# Patient Record
Sex: Female | Born: 1970 | Hispanic: Yes | Marital: Married | Smoking: Never smoker
Health system: Southern US, Community
[De-identification: ages and names within clinical notes are randomized; demographics above are authoritative.]

## PROBLEM LIST (undated history)

## (undated) DIAGNOSIS — R7303 Prediabetes: Secondary | ICD-10-CM

## (undated) DIAGNOSIS — I10 Essential (primary) hypertension: Secondary | ICD-10-CM

## (undated) DIAGNOSIS — G8929 Other chronic pain: Secondary | ICD-10-CM

## (undated) DIAGNOSIS — M549 Dorsalgia, unspecified: Secondary | ICD-10-CM

## (undated) DIAGNOSIS — M1990 Unspecified osteoarthritis, unspecified site: Secondary | ICD-10-CM

## (undated) DIAGNOSIS — Z8709 Personal history of other diseases of the respiratory system: Secondary | ICD-10-CM

## (undated) HISTORY — PX: TONSILLECTOMY: SUR1361

## (undated) HISTORY — PX: BOWEL RESECTION: SHX1257

## (undated) HISTORY — PX: EYE SURGERY: SHX253

## (undated) HISTORY — PX: CHOLECYSTECTOMY: SHX55

---

## 2006-10-21 ENCOUNTER — Emergency Department: Payer: Self-pay | Admitting: Emergency Medicine

## 2006-10-23 ENCOUNTER — Ambulatory Visit: Payer: Self-pay | Admitting: Unknown Physician Specialty

## 2006-11-23 ENCOUNTER — Emergency Department: Payer: Self-pay | Admitting: Emergency Medicine

## 2010-03-27 ENCOUNTER — Ambulatory Visit: Payer: Self-pay | Admitting: Internal Medicine

## 2010-11-09 ENCOUNTER — Ambulatory Visit: Payer: Self-pay | Admitting: Anesthesiology

## 2010-11-14 ENCOUNTER — Ambulatory Visit: Payer: Self-pay | Admitting: Surgery

## 2011-05-27 ENCOUNTER — Ambulatory Visit: Payer: Self-pay | Admitting: Internal Medicine

## 2012-05-29 ENCOUNTER — Ambulatory Visit: Payer: Self-pay | Admitting: Unknown Physician Specialty

## 2012-06-04 ENCOUNTER — Ambulatory Visit: Payer: Self-pay | Admitting: Internal Medicine

## 2012-08-20 LAB — COMPREHENSIVE METABOLIC PANEL
Anion Gap: 4 — ABNORMAL LOW (ref 7–16)
Bilirubin,Total: 0.5 mg/dL (ref 0.2–1.0)
Calcium, Total: 9.2 mg/dL (ref 8.5–10.1)
Chloride: 100 mmol/L (ref 98–107)
Co2: 32 mmol/L (ref 21–32)
Creatinine: 0.66 mg/dL (ref 0.60–1.30)
EGFR (African American): >60
EGFR (Non-African Amer.): >60
Glucose: 108 mg/dL — ABNORMAL HIGH (ref 65–99)
Potassium: 3.7 mmol/L (ref 3.5–5.1)
SGOT(AST): 23 U/L (ref 15–37)
SGPT (ALT): 23 U/L (ref 12–78)
Sodium: 136 mmol/L (ref 136–145)
Total Protein: 8.5 g/dL — ABNORMAL HIGH (ref 6.4–8.2)

## 2012-08-20 LAB — CBC
HCT: 41.6 % (ref 35.0–47.0)
MCH: 27 pg (ref 26.0–34.0)
MCHC: 33.8 g/dL (ref 32.0–36.0)
MCV: 80 fL (ref 80–100)
Platelet: 315 10*3/uL (ref 150–440)
RBC: 5.21 10*6/uL — ABNORMAL HIGH (ref 3.80–5.20)
RDW: 13.9 % (ref 11.5–14.5)

## 2012-08-20 LAB — URINALYSIS, COMPLETE
Bacteria: NONE SEEN
Bilirubin,UR: NEGATIVE
Glucose,UR: NEGATIVE mg/dL (ref 0–75)
Ketone: NEGATIVE
Leukocyte Esterase: NEGATIVE
Nitrite: NEGATIVE
Ph: 9 (ref 4.5–8.0)
RBC,UR: 415 /HPF (ref 0–5)
WBC UR: 3 /HPF (ref 0–5)

## 2012-08-20 LAB — LIPASE, BLOOD: Lipase: 125 U/L (ref 73–393)

## 2012-08-21 ENCOUNTER — Observation Stay: Payer: Self-pay | Admitting: Surgery

## 2012-08-21 LAB — POTASSIUM: Potassium: 4 mmol/L (ref 3.5–5.1)

## 2013-06-14 ENCOUNTER — Ambulatory Visit: Payer: Self-pay

## 2014-05-20 NOTE — H&P (Signed)
History of Present Illness 44 yo Mexican-American female with 5 day h/o epigatric pain, which initially waxed and waned, and avergage episodes lasting about 3 hours, whose pain did not resolve today. No nausea, vomited once (today), no fever, and no change n the color of her urine, stool, skin, or eyes.   Past Med/Surgical Hx:  anal fissure:   HTN:   lower back problems:   Obesity:   Excision of anal polyps and internal hemorrhoids-rubber band:   Dilation and Curretage:   ALLERGIES:  No Known Allergies:   HOME MEDICATIONS: Medication Instructions Status  hydrochlorothiazide 25 mg oral tablet 1 tab(s) orally once a day Active   Family and Social History:  Family History sister had a complicated postop course from cholecystectomy in [place]   Social History negative tobacco, negative ETOH, married, lives with husband, unemployed   Place of [place]   Review of Systems:  Fever/Chills No   Cough No   Sputum No   Abdominal Pain Yes   Diarrhea No   Constipation No   Nausea/Vomiting Yes   SOB/DOE No   Chest Pain No   Dysuria No   Tolerating PT Yes   Tolerating Diet Yes  Vomiting   Medications/Allergies Reviewed Medications/Allergies reviewed   Physical Exam:  GEN well developed, well nourished, no acute distress, obese   HEENT pink conjunctivae, PERRL, hearing intact to voice, moist oral mucosa, Oropharynx clear   RESP normal resp effort  clear BS  no use of accessory muscles   CARD regular rate  no murmur  No LE edema  no JVD  no Rub   ABD positive tenderness  soft  very mild RUQ tenderness   LYMPH negative neck   EXTR negative cyanosis/clubbing, negative edema   SKIN normal to palpation, No rashes, No ulcers, skin turgor good   NEURO cranial nerves intact, negative tremor, follows commands, motor/sensory function intact   PSYCH alert, A+O to time, place, person, good insight   Lab Results: Hepatic:  24-Jul-14 19:18   Bilirubin, Total 0.5   Alkaline Phosphatase 119  SGPT (ALT) 23  SGOT (AST) 23  Total Protein, Serum  8.5  Albumin, Serum 4.2  Routine Chem:  24-Jul-14 19:18   Glucose, Serum  108  BUN 13  Creatinine (comp) 0.66  Sodium, Serum 136  Potassium, Serum 3.7  Chloride, Serum 100  CO2, Serum 32  Calcium (Total), Serum 9.2  Osmolality (calc) 273  eGFR (African American) >60  eGFR (Non-African American) >60 (eGFR values <63m/min/1.73 m2 may be an indication of chronic kidney disease (CKD). Calculated eGFR is useful in patients with stable renal function. The eGFR calculation will not be reliable in acutely ill patients when serum creatinine is changing rapidly. It is not useful in  patients on dialysis. The eGFR calculation may not be applicable to patients at the low and high extremes of body sizes, pregnant women, and vegetarians.)  Anion Gap  4  Lipase 125 (Result(s) reported on 20 Aug 2012 at 07:54PM.)  Cardiac:  24-Jul-14 19:18   Troponin I < 0.02 (0.00-0.05 0.05 ng/mL or less: NEGATIVE  Repeat testing in 3-6 hrs  if clinically indicated. >0.05 ng/mL: POTENTIAL  MYOCARDIAL INJURY. Repeat  testing in 3-6 hrs if  clinically indicated. NOTE: An increase or decrease  of 30% or more on serial  testing suggests a  clinically important change)  Routine UA:  24-Jul-14 19:18   Color (UA) Amber  Clarity (UA) Clear  Glucose (UA) Negative  Bilirubin (  UA) Negative  Ketones (UA) Negative  Specific Gravity (UA) 1.023  Blood (UA) 3+  pH (UA) 9.0  Protein (UA) 100 mg/dL  Nitrite (UA) Negative  Leukocyte Esterase (UA) Negative (Result(s) reported on 20 Aug 2012 at 08:01PM.)  RBC (UA) 415 /HPF  WBC (UA) 3 /HPF  Bacteria (UA) NONE SEEN  Epithelial Cells (UA) 2 /HPF  Mucous (UA) PRESENT (Result(s) reported on 20 Aug 2012 at 08:01PM.)  Routine Hem:  24-Jul-14 19:18   WBC (CBC)  14.0  RBC (CBC)  5.21  Hemoglobin (CBC) 14.1  Hematocrit (CBC) 41.6  Platelet Count (CBC) 315 (Result(s) reported on 20 Aug 2012 at 07:46PM.)  MCV 80  MCH 27.0  MCHC 33.8  RDW 13.9   Radiology Results: [place]:    24-Jul-14 22:20, US Abdomen Limited Survey  US Abdomen Limited Survey  REASON FOR EXAM:    ruq pain, positive murphy sign  COMMENTS:   Body Site: GB and Fossa, CBD, Head of Pancreas    PROCEDURE: [place]  - US ABDOMEN LIMITED SURVEY  - Aug 20 2012 10:20PM     RESULT: Comparison: None    Technique: Multiple gray-scale and color-flow Doppler images of the right   upper quadrant are presented for review.    Findings:    Visualized portions of the liver demonstrate normal echogenicity and   normal contours. The liver is without evidence of a focal hepatic lesion.   There arecholelithiasis. There is an impacted gallstone in the   gallbladder neck. There is no intra- or extrahepatic biliary ductal   dilatation. The common duct measures 4.9 mm in maximal diameter. There is   no gallbladder wall thickening, pericholecystic fluid, or sonographic   Murphy's sign.     The visualized portion of the pancreas is normal in echogenicity.    IMPRESSION:     Cholelithiasis without sonographic evidence of acute cholecystitis. There   is a gallstone impacted within the gallbladder neck.    Dictation Site: 1    Verified By: Jennette Banker, M.D., MD  LabUnknown:    24-Jul-14 20:49, CT Abdomen Pelvis WO for [facility]  PACS Image    24-Jul-14 22:20, US Abdomen Limited Survey  PACS Image  CT:    24-Jul-14 20:49, CT Abdomen Pelvis WO for [facility]  CT Abdomen Pelvis WO for Stone  REASON FOR EXAM:    hematuria, CVA tenderness  COMMENTS:   LMP: Negative Beta HCG    PROCEDURE: CT  - CT ABDOMEN /PELVIS WO (STONE)  - Aug 20 2012  8:49PM     RESULT: Indication: Flank Pain    Comparison: None    Technique: Multiple axial images fromthe lung bases to the symphysis   pubis were obtained without oral and without intravenous contrast.    Findings:    The lung bases are clear. There is no pleural or pericardial  effusions.  No renal, ureteral, or bladder calculi. No obstructive uropathy. No   perinephric stranding is seen. The kidneys are symmetric in size without   evidence for exophytic mass. The bladder is unremarkable.    The liver demonstrates no focal abnormality. The gallbladder is   unremarkable. The spleen demonstrates no focal abnormality. The adrenal   glands and pancreas are normal.     The unopacified stomach, duodenum, small intestine, and large intestine   are unremarkable, but evaluation is limited by lack of oral contrast.   There is a normal caliber appendixin the right lower quadrant without   periappendiceal inflammatory  changes. There is no pneumoperitoneum,   pneumatosis, or portal venous gas. There is no abdominal or pelvic free   fluid. There is no lymphadenopathy.   The abdominal aorta is normal in caliber .    The osseous structures are unremarkable.    IMPRESSION:     1. No urolithiasis or obstructive uropathy.     Dictation Site: 1        Verified By: Jennette Banker, M.D., MD    Assessment/Admission Diagnosis Possible gallbladder hydrops   Plan Admit, lap CCY Pt and husband understand 1/200 risk of CBD injury and agree to proceed.   Electronic Signatures: Consuela Mimes (MD)  (Signed 24-Jul-14 23:23)  Authored: CHIEF COMPLAINT and HISTORY, PAST MEDICAL/SURGIAL HISTORY, ALLERGIES, HOME MEDICATIONS, FAMILY AND SOCIAL HISTORY, REVIEW OF SYSTEMS, PHYSICAL EXAM, LABS, Radiology, ASSESSMENT AND PLAN   Last Updated: 24-Jul-14 23:23 by Consuela Mimes (MD)

## 2014-05-20 NOTE — Op Note (Signed)
PATIENT NAME:  Taylor Valencia, Taylor Valencia MR#:  [id number] DATE OF BIRTH:  05-Mar-1970  DATE OF PROCEDURE:  08/21/2012  PREOPERATIVE DIAGNOSIS: Acute cholecystitis.   POSTOPERATIVE DIAGNOSIS: Acute cholecystitis.   PROCEDURE PERFORMED: Laparoscopic cholecystectomy.   ESTIMATED BLOOD LOSS: 200 mL.   COMPLICATIONS: None.   SPECIMENS: Gallbladder.   IMPLANTS: One JP drain in gallbladder fossa.   HISTORY OF PRESENT ILLNESS: Ms. Taylor Valencia is a 44 year old female who has had a history of recurrent right upper quadrant pain and 5 days of consistent worsening right upper quadrant pain. She was noted to have a nonmobile stone in the neck of her gallbladder and leukocytosis. I thus brought her to the operating room suite for laparoscopic cholecystectomy.   DETAILS OF PROCEDURE: As follows: Ms. Taylor Valencia was brought to the operating room suite. She was laid supine on the operating room table. She was induced, endotracheal tube was placed, general anesthesia was administered. Her abdomen was then prepped and draped in standard surgical fashion. A timeout was then performed correctly identifying the patient name, operative site and procedure to be performed. A supraumbilical incision was made. This was deepened down to the fascia. The fascia was incised. The peritoneum was entered. Two stay sutures were placed through the fasciotomy. A Hasson trocar was placed through the fasciotomy. The abdomen was insufflated. The gallbladder was distended and taut and appeared to be acutely inflamed. An 11 mm epigastric port was placed, and two 5 mm right subcostal trocars were placed at the midclavicular and anterior axillary line. The gallbladder was then aspirated with an ovarian cyst aspirator, and the gallbladder was lifted over the dome of the liver. The cystic duct and cystic artery were dissected out. There was a very thick rind around the gallbladder and the cystic duct. A distinct cystic artery was not  identified, but there were 2 candidates which were all clipped and ligated. The gallbladder cystic duct was dilated and thickened, and I was afraid that placing clips across the cystic duct would not be sufficient. Therefore, a used an Endo GIA stapler to ligate the cystic duct. The gallbladder was then taken off the gallbladder fossa. It was taken out of the abdomen with an Endo Catch bag. I then looked at the gallbladder fossa and noted it was hemostatic. I irrigated the gallbladder fossa with a large amount of normal saline. Because of the amount of inflammation and the size of the cystic duct, I did leave a JP drain which was brought out through the lateral port site and sutured in place with a 3-0 nylon suture. After the gallbladder fossa was irrigated and noted to be hemostatic, I removed all trocars under direct visualization. The supraumbilical fascia was closed with a figure-of-eight 0 Vicryl suture. All port sites were closed with a 4-0 Monocryl deep dermal suture. The patient was then awoken, extubated and brought to the postanesthesia care unit. There were no immediate complications. Needle, sponge and instrument counts were correct at the end of the procedure.   ____________________________ Glena Norfolk. Melani Brisbane, MD cal:OSi D: 08/22/2012 11:25:00 ET T: 08/22/2012 11:35:44 ET JOB#: [id number]  cc: Harrell Gave A. Shasta Chinn, MD, <Dictator> Floyde Parkins MD ELECTRONICALLY SIGNED 08/31/2012 19:04

## 2014-08-04 ENCOUNTER — Other Ambulatory Visit: Payer: Self-pay | Admitting: Internal Medicine

## 2014-08-04 DIAGNOSIS — Z1231 Encounter for screening mammogram for malignant neoplasm of breast: Secondary | ICD-10-CM

## 2014-08-11 ENCOUNTER — Ambulatory Visit
Admission: RE | Admit: 2014-08-11 | Discharge: 2014-08-11 | Disposition: A | Discharge status: Home / Self Care | Payer: BLUE CROSS/BLUE SHIELD | Source: Ambulatory Visit | Attending: Internal Medicine | Admitting: Internal Medicine

## 2014-08-11 DIAGNOSIS — Z1231 Encounter for screening mammogram for malignant neoplasm of breast: Secondary | ICD-10-CM | POA: Insufficient documentation

## 2014-10-06 ENCOUNTER — Ambulatory Visit (INDEPENDENT_AMBULATORY_CARE_PROVIDER_SITE_OTHER): Payer: BLUE CROSS/BLUE SHIELD | Admitting: Podiatry

## 2014-10-06 ENCOUNTER — Encounter: Payer: Self-pay | Admitting: Podiatry

## 2014-10-06 VITALS — BP 145/82 | HR 71 | Resp 18

## 2014-10-06 DIAGNOSIS — L600 Ingrowing nail: Secondary | ICD-10-CM

## 2014-10-06 NOTE — Progress Notes (Signed)
   Subjective:    Patient ID: Taylor Valencia, female    DOB: [birth year], 44 y.o.   MRN: [id number]  HPI  44 year old female presents the office today with concerns of left big toe nail pain which has been ongoing for the last several weeks. She states that she has tried to see if there is some drainage coming from the nail border however she is only got some blood to clear drainage but denies any pus. She states that the inside border of the left hallux toenail is painful particularly pressure. She is to trim the area of her nail herself without any relief. She denies any surrounding redness or red streaks. No other complaints at this time.   Review of Systems  All other systems reviewed and are negative.      Objective:   Physical Exam AAO x3, NAD DP/PT pulses palpable bilaterally, CRT less than 3 seconds Protective sensation intact with Simms Weinstein monofilament, vibratory sensation intact, Achilles tendon reflex intact There is evidence of incurvation on the medial aspect of the left hallux toenail with tenderness to palpation along the proximal medial border of the left hallux toenail. Because they could be some dried drainage on the nail border and the small amount of granulation tissues present in the nail borders well. There is a small amount of localized edema dripping from the nail border on the medial aspect without any associated erythema, increase in warmth, ascending cellulitis, fluctuance, crepitus, malodor. There is no tenderness to the lateral portion of the nail or the other toenails. No areas of tenderness to bilateral lower extremities. MMT 5/5, ROM WNL.  No open lesions or pre-ulcerative lesions.  No overlying edema, erythema, increase in warmth to bilateral lower extremities.  No pain with calf compression, swelling, warmth, erythema bilaterally.      Assessment & Plan:  44 year old female left medial hallux symptomatic ingrown toenail -Treatment options  discussed including all alternatives, risks, and complications -At this time, the patient is requesting partial nail removal with chemical matricectomy to the symptomatic portion of the nail. Risks and complications were discussed with the patient for which they understand and  verbally consent to the procedure. Under sterile conditions a total of 3 mL of a mixture of 2% lidocaine plain and 0.5% Marcaine plain was infiltrated in a hallux block fashion. Once anesthetized, the skin was prepped in sterile fashion. A tourniquet was then applied. Next the medial aspect of hallux nail border was then sharply excised making sure to remove the entire offending nail border. Once the nails were ensured to be removed area was debrided and the underlying skin was intact. There is no purulence identified in the procedure. Next phenol was then applied under standard conditions and copiously irrigated. Silvadene was applied. A dry sterile dressing was applied. After application of the dressing the tourniquet was removed and there is found to be an immediate capillary refill time to the digit. The patient tolerated the procedure well any complications. Post procedure instructions were discussed the patient for which he verbally understood. Follow-up in one week for nail check or sooner if any problems are to arise. Discussed signs/symptoms of infection and directed to call the office immediately should any occur or go directly to the emergency room. In the meantime, encouraged to call the office with any questions, concerns, changes symptoms.  Celesta Gentile, DPM

## 2014-10-06 NOTE — Patient Instructions (Signed)

## 2014-10-13 ENCOUNTER — Ambulatory Visit (INDEPENDENT_AMBULATORY_CARE_PROVIDER_SITE_OTHER): Payer: BLUE CROSS/BLUE SHIELD | Admitting: Podiatry

## 2014-10-13 ENCOUNTER — Encounter: Payer: Self-pay | Admitting: Podiatry

## 2014-10-13 VITALS — BP 131/76 | HR 83 | Resp 18

## 2014-10-13 DIAGNOSIS — Z9889 Other specified postprocedural states: Secondary | ICD-10-CM

## 2014-10-13 DIAGNOSIS — L600 Ingrowing nail: Secondary | ICD-10-CM

## 2014-10-13 NOTE — Patient Instructions (Signed)

## 2014-10-13 NOTE — Progress Notes (Signed)
Patient ID: Taylor Valencia, female   DOB: Jun 04, 1970, 44 y.o.   MRN: [id number]  Subjective: 44 year old female presents the office they for followup evaluation one week status post left partial nail avulsion of the hallux. She states that overall she is doing well. She says occasionally does throb. She stopped covering the area this morning as the area was remained somewhat wet. Denies any drainage or purulence. Denies any redness or drainage streaks. Denies any systemic complaints as fevers, chills, nausea, vomiting. No calf pain, chest pain, shortness of breath. No other complaints at this time. No acute changes otherwise.  Objective: AAO X3, NAD  DP/PT pulses palpable, CRT less than 3 seconds  Protective sensation intact with Simms Weinstein monofilament Status post left medial hallux partial nail avulsion. This Versus Chronic Granulation Tissue Present. There Is No Tenderness to Palpation over the Surgical Site. There Is No Surrounding Edema, Erythema, Increase in Warmth. There Is No Drainage or Purulence. No Clinical Signs of Infection. No Malodor. No Other Areas of Tenderness to Bilateral Lower Sugars. Open Lesions or Pre-Ulcerative Lesions Otherwise. No Pain with Calf Compression, Swelling, Warmth, Erythema.  Assessment: 1 week status post left medial hallux partial nail avulsion, doing well  Plan: Continue soaking in epsom salts twice a day followed by antibiotic ointment and a band-aid. Can leave uncovered at night. Continue this until completely healed.  If the area has not healed in 2 weeks, call the office for follow-up appointment, or sooner if any problems arise.  Monitor for any signs/symptoms of infection. Call the office immediately if any occur or go directly to the emergency room. Call with any questions/concerns.  Celesta Gentile, DPM

## 2015-05-25 ENCOUNTER — Other Ambulatory Visit: Payer: Self-pay | Admitting: Internal Medicine

## 2015-05-25 DIAGNOSIS — G8929 Other chronic pain: Secondary | ICD-10-CM

## 2015-05-25 DIAGNOSIS — M5134 Other intervertebral disc degeneration, thoracic region: Secondary | ICD-10-CM

## 2015-05-25 DIAGNOSIS — M546 Pain in thoracic spine: Secondary | ICD-10-CM

## 2015-06-15 ENCOUNTER — Ambulatory Visit: Payer: BLUE CROSS/BLUE SHIELD

## 2015-08-02 ENCOUNTER — Other Ambulatory Visit: Payer: Self-pay | Admitting: Neurosurgery

## 2015-08-14 ENCOUNTER — Other Ambulatory Visit: Payer: Self-pay | Admitting: Internal Medicine

## 2015-08-14 DIAGNOSIS — Z1231 Encounter for screening mammogram for malignant neoplasm of breast: Secondary | ICD-10-CM

## 2015-08-16 ENCOUNTER — Encounter (HOSPITAL_COMMUNITY): Payer: Self-pay

## 2015-08-16 ENCOUNTER — Encounter (HOSPITAL_COMMUNITY)
Admission: RE | Admit: 2015-08-16 | Discharge: 2015-08-16 | Disposition: A | Discharge status: Home / Self Care | Payer: BLUE CROSS/BLUE SHIELD | Source: Ambulatory Visit | Attending: Neurosurgery | Admitting: Neurosurgery

## 2015-08-16 DIAGNOSIS — Z01812 Encounter for preprocedural laboratory examination: Secondary | ICD-10-CM | POA: Diagnosis present

## 2015-08-16 DIAGNOSIS — Z01810 Encounter for preprocedural cardiovascular examination: Secondary | ICD-10-CM | POA: Insufficient documentation

## 2015-08-16 HISTORY — DX: Other chronic pain: G89.29

## 2015-08-16 HISTORY — DX: Essential (primary) hypertension: I10

## 2015-08-16 HISTORY — DX: Unspecified osteoarthritis, unspecified site: M19.90

## 2015-08-16 HISTORY — DX: Dorsalgia, unspecified: M54.9

## 2015-08-16 HISTORY — DX: Personal history of other diseases of the respiratory system: Z87.09

## 2015-08-16 LAB — SURGICAL PCR SCREEN
MRSA, PCR: NEGATIVE
STAPHYLOCOCCUS AUREUS: NEGATIVE

## 2015-08-16 LAB — CBC
HEMATOCRIT: 40.8 % (ref 36.0–46.0)
HEMOGLOBIN: 14 g/dL (ref 12.0–15.0)
MCH: 28.5 pg (ref 26.0–34.0)
MCHC: 34.3 g/dL (ref 30.0–36.0)
MCV: 82.9 fL (ref 78.0–100.0)
Platelets: 276 10*3/uL (ref 150–400)
RBC: 4.92 MIL/uL (ref 3.87–5.11)
RDW: 13.9 % (ref 11.5–15.5)
WBC: 13.2 10*3/uL — AB (ref 4.0–10.5)

## 2015-08-16 LAB — BASIC METABOLIC PANEL
ANION GAP: 6 (ref 5–15)
BUN: 16 mg/dL (ref 6–20)
CALCIUM: 9.1 mg/dL (ref 8.9–10.3)
CO2: 21 mmol/L — ABNORMAL LOW (ref 22–32)
Chloride: 108 mmol/L (ref 101–111)
Creatinine, Ser: 0.49 mg/dL (ref 0.44–1.00)
GFR calc Af Amer: >60 mL/min (ref >60)
Glucose, Bld: 96 mg/dL (ref 65–99)
POTASSIUM: 3.6 mmol/L (ref 3.5–5.1)
SODIUM: 135 mmol/L (ref 135–145)

## 2015-08-16 LAB — HCG, SERUM, QUALITATIVE: PREG SERUM: NEGATIVE

## 2015-08-16 NOTE — Progress Notes (Signed)
PCP - Glendon Axe Cardiologist - denies  Chest x-ray - not needed EKG - 08/16/15 Stress Test - denies ECHO - denies Cardiac Cath - denies    Patient denies shortness of breath, fever, cough and chest pain at PAT appointment

## 2015-08-16 NOTE — Pre-Procedure Instructions (Signed)
[facility]  08/16/2015      [facility] [place], [place] - [address] [place] [address] Phone: [phone] Fax: [phone]    Your procedure is scheduled on July 27  Report to [facility] [place]M.  Call this number if you have problems the morning of surgery:  [phone]   Remember:  Do not eat food or drink liquids after midnight.   Take these medicines the morning of surgery with A SIP OF WATER eye drops if needed, tramdol if needed  7 days prior to surgery STOP taking any Aspirin, Aleve, Naproxen, Ibuprofen, Motrin, Advil, Goody's, BC's, all herbal medications, fish oil, and all vitamins also   stop taking Lorcaserin today    Do not wear jewelry, make-up or nail polish.  Do not wear lotions, powders, or perfumes.  You may NOT wear deoderant.  Do not shave 48 hours prior to surgery.    Do not bring valuables to the hospital.   [facility] is not responsible for any belongings or valuables.  Contacts, dentures or bridgework may not be worn into surgery.  Leave your suitcase in the car.  After surgery it may be brought to your room.  For patients admitted to the hospital, discharge time will be determined by your treatment team.  Patients discharged the day of surgery will not be allowed to drive home.    Special instructions:   [place]- Preparing For Surgery  Before surgery, you can play an important role. Because skin is not sterile, your skin needs to be as free of germs as possible. You can reduce the number of germs on your skin by washing with CHG (chlorahexidine gluconate) Soap before surgery.  CHG is an antiseptic cleaner which kills germs and bonds with the skin to continue killing germs even after washing.  Please do not use if you have an allergy to CHG or antibacterial soaps. If your skin becomes reddened/irritated stop using the CHG.  Do not shave (including legs and  underarms) for at least 48 hours prior to first CHG shower. It is OK to shave your face.  Please follow these instructions carefully.   1. Shower the NIGHT BEFORE SURGERY and the MORNING OF SURGERY with CHG.   2. If you chose to wash your hair, wash your hair first as usual with your normal shampoo.  3. After you shampoo, rinse your hair and body thoroughly to remove the shampoo.  4. Use CHG as you would any other liquid soap. You can apply CHG directly to the skin and wash gently with a scrungie or a clean washcloth.   5. Apply the CHG Soap to your body ONLY FROM THE NECK DOWN.  Do not use on open wounds or open sores. Avoid contact with your eyes, ears, mouth and genitals (private parts). Wash genitals (private parts) with your normal soap.  6. Wash thoroughly, paying special attention to the area where your surgery will be performed.  7. Thoroughly rinse your body with warm water from the neck down.  8. DO NOT shower/wash with your normal soap after using and rinsing off the CHG Soap.  9. Pat yourself dry with a CLEAN TOWEL.   10. Wear CLEAN PAJAMAS   11. Place CLEAN SHEETS on your bed the night of your first shower and DO NOT SLEEP WITH PETS.    Day of Surgery: Do not apply any deodorants/lotions. Please wear clean clothes to  the hospital/surgery center.      Please read over the following fact sheets that you were given. Pain Booklet, Coughing and Deep Breathing, MRSA Information and Surgical Site Infection Prevention

## 2015-08-23 MED ORDER — DEXTROSE 5 % IV SOLN
3.0000 g | INTRAVENOUS | Status: AC
  Administered 2015-08-24 (×2): 3 g via INTRAVENOUS
  Filled 2015-08-23: qty 3000

## 2015-08-23 MED ORDER — CEFAZOLIN SODIUM-DEXTROSE 2-4 GM/100ML-% IV SOLN: 2.0000 g | INTRAVENOUS | Status: DC

## 2015-08-24 ENCOUNTER — Encounter (HOSPITAL_COMMUNITY)
Admission: RE | Disposition: A | Discharge status: Home / Self Care | Payer: Self-pay | Source: Ambulatory Visit | Attending: Neurosurgery

## 2015-08-24 ENCOUNTER — Ambulatory Visit (HOSPITAL_COMMUNITY): Payer: BLUE CROSS/BLUE SHIELD

## 2015-08-24 ENCOUNTER — Ambulatory Visit (HOSPITAL_COMMUNITY): Payer: BLUE CROSS/BLUE SHIELD | Admitting: Anesthesiology

## 2015-08-24 ENCOUNTER — Ambulatory Visit (HOSPITAL_COMMUNITY)
Admission: RE | Admit: 2015-08-24 | Discharge: 2015-08-26 | Disposition: A | Discharge status: Home / Self Care | Payer: BLUE CROSS/BLUE SHIELD | Source: Ambulatory Visit | Attending: Neurosurgery | Admitting: Neurosurgery

## 2015-08-24 ENCOUNTER — Ambulatory Visit (HOSPITAL_COMMUNITY): Payer: BLUE CROSS/BLUE SHIELD | Admitting: Emergency Medicine

## 2015-08-24 ENCOUNTER — Encounter (HOSPITAL_COMMUNITY): Payer: Self-pay | Admitting: Surgery

## 2015-08-24 DIAGNOSIS — G959 Disease of spinal cord, unspecified: Secondary | ICD-10-CM | POA: Insufficient documentation

## 2015-08-24 DIAGNOSIS — Z419 Encounter for procedure for purposes other than remedying health state, unspecified: Secondary | ICD-10-CM

## 2015-08-24 DIAGNOSIS — Z791 Long term (current) use of non-steroidal anti-inflammatories (NSAID): Secondary | ICD-10-CM | POA: Diagnosis not present

## 2015-08-24 DIAGNOSIS — I10 Essential (primary) hypertension: Secondary | ICD-10-CM | POA: Diagnosis not present

## 2015-08-24 DIAGNOSIS — Z79899 Other long term (current) drug therapy: Secondary | ICD-10-CM | POA: Diagnosis not present

## 2015-08-24 DIAGNOSIS — M4714 Other spondylosis with myelopathy, thoracic region: Secondary | ICD-10-CM | POA: Diagnosis present

## 2015-08-24 DIAGNOSIS — M4804 Spinal stenosis, thoracic region: Secondary | ICD-10-CM | POA: Diagnosis present

## 2015-08-24 DIAGNOSIS — M5414 Radiculopathy, thoracic region: Secondary | ICD-10-CM | POA: Diagnosis not present

## 2015-08-24 HISTORY — PX: LUMBAR LAMINECTOMY/DECOMPRESSION MICRODISCECTOMY: SHX5026

## 2015-08-24 SURGERY — LUMBAR LAMINECTOMY/DECOMPRESSION MICRODISCECTOMY 2 LEVELS
Anesthesia: General

## 2015-08-24 MED ORDER — HYDROMORPHONE HCL 1 MG/ML IJ SOLN
INTRAMUSCULAR | Status: AC
  Filled 2015-08-24: qty 2

## 2015-08-24 MED ORDER — ROCURONIUM BROMIDE 100 MG/10ML IV SOLN: INTRAVENOUS | Status: DC | PRN

## 2015-08-24 MED ORDER — ONDANSETRON HCL 4 MG/2ML IJ SOLN
INTRAMUSCULAR | Status: AC
  Filled 2015-08-24: qty 2

## 2015-08-24 MED ORDER — THROMBIN 5000 UNITS EX SOLR
OROMUCOSAL | Status: DC | PRN
  Administered 2015-08-24: 10:00:00 via TOPICAL

## 2015-08-24 MED ORDER — CARBOXYMETHYLCELLULOSE SODIUM 0.5 % OP SOLN: 1.0000 [drp] | Freq: Three times a day (TID) | OPHTHALMIC | Status: DC | PRN

## 2015-08-24 MED ORDER — PROPOFOL 10 MG/ML IV BOLUS: INTRAVENOUS | Status: DC | PRN

## 2015-08-24 MED ORDER — PROPOFOL 10 MG/ML IV BOLUS
INTRAVENOUS | Status: DC | PRN
  Administered 2015-08-24: 30 mg via INTRAVENOUS
  Administered 2015-08-24: 170 mg via INTRAVENOUS

## 2015-08-24 MED ORDER — CHLORHEXIDINE GLUCONATE CLOTH 2 % EX PADS: 6.0000 | MEDICATED_PAD | Freq: Once | CUTANEOUS | Status: DC

## 2015-08-24 MED ORDER — SUCCINYLCHOLINE CHLORIDE 200 MG/10ML IV SOSY
PREFILLED_SYRINGE | INTRAVENOUS | Status: AC
  Filled 2015-08-24: qty 10

## 2015-08-24 MED ORDER — SUCCINYLCHOLINE CHLORIDE 20 MG/ML IJ SOLN: INTRAMUSCULAR | Status: DC | PRN

## 2015-08-24 MED ORDER — SODIUM CHLORIDE 0.9 % IR SOLN
Status: DC | PRN
  Administered 2015-08-24: 09:00:00

## 2015-08-24 MED ORDER — PROPOFOL 10 MG/ML IV BOLUS
INTRAVENOUS | Status: AC
  Filled 2015-08-24: qty 40

## 2015-08-24 MED ORDER — ACETAMINOPHEN 10 MG/ML IV SOLN
INTRAVENOUS | Status: AC
  Filled 2015-08-24: qty 100

## 2015-08-24 MED ORDER — CEFAZOLIN SODIUM-DEXTROSE 2-4 GM/100ML-% IV SOLN
2.0000 g | Freq: Three times a day (TID) | INTRAVENOUS | Status: AC
  Administered 2015-08-24 – 2015-08-25 (×2): 2 g via INTRAVENOUS
  Filled 2015-08-24 (×2): qty 100

## 2015-08-24 MED ORDER — LIDOCAINE HCL (CARDIAC) 20 MG/ML IV SOLN: INTRAVENOUS | Status: DC | PRN

## 2015-08-24 MED ORDER — PROMETHAZINE HCL 25 MG/ML IJ SOLN: 6.2500 mg | INTRAMUSCULAR | Status: DC | PRN

## 2015-08-24 MED ORDER — LACTATED RINGERS IV SOLN
INTRAVENOUS | Status: DC
  Administered 2015-08-24: 13:00:00 via INTRAVENOUS

## 2015-08-24 MED ORDER — HYPROMELLOSE (GONIOSCOPIC) 2.5 % OP SOLN
1.0000 [drp] | Freq: Three times a day (TID) | OPHTHALMIC | Status: DC | PRN
  Filled 2015-08-24: qty 15

## 2015-08-24 MED ORDER — LACTATED RINGERS IV SOLN: INTRAVENOUS | Status: DC

## 2015-08-24 MED ORDER — LIDOCAINE HCL (CARDIAC) 20 MG/ML IV SOLN
INTRAVENOUS | Status: DC | PRN
  Administered 2015-08-24: 60 mg via INTRAVENOUS

## 2015-08-24 MED ORDER — THROMBIN 20000 UNITS EX SOLR
CUTANEOUS | Status: DC | PRN
  Administered 2015-08-24: 10:00:00 via TOPICAL

## 2015-08-24 MED ORDER — ACETAMINOPHEN 10 MG/ML IV SOLN
1000.0000 mg | Freq: Once | INTRAVENOUS | Status: AC
  Administered 2015-08-24: 1000 mg via INTRAVENOUS

## 2015-08-24 MED ORDER — ROCURONIUM BROMIDE 50 MG/5ML IV SOLN
INTRAVENOUS | Status: AC
  Filled 2015-08-24: qty 1

## 2015-08-24 MED ORDER — DOCUSATE SODIUM 100 MG PO CAPS
100.0000 mg | ORAL_CAPSULE | Freq: Two times a day (BID) | ORAL | Status: DC
  Administered 2015-08-24 – 2015-08-26 (×4): 100 mg via ORAL
  Filled 2015-08-24 (×4): qty 1

## 2015-08-24 MED ORDER — TIZANIDINE HCL 4 MG PO TABS
2.0000 mg | ORAL_TABLET | Freq: Every day | ORAL | Status: DC
  Administered 2015-08-24 – 2015-08-25 (×2): 2 mg via ORAL
  Filled 2015-08-24 (×2): qty 1

## 2015-08-24 MED ORDER — MEPERIDINE HCL 25 MG/ML IJ SOLN: 6.2500 mg | INTRAMUSCULAR | Status: DC | PRN

## 2015-08-24 MED ORDER — TRAMADOL HCL 50 MG PO TABS
50.0000 mg | ORAL_TABLET | Freq: Four times a day (QID) | ORAL | Status: DC | PRN
  Administered 2015-08-24: 50 mg via ORAL
  Filled 2015-08-24: qty 1

## 2015-08-24 MED ORDER — PHENYLEPHRINE HCL 10 MG/ML IJ SOLN
INTRAMUSCULAR | Status: DC | PRN
  Administered 2015-08-24: 80 ug via INTRAVENOUS
  Administered 2015-08-24: 40 ug via INTRAVENOUS
  Administered 2015-08-24 (×2): 80 ug via INTRAVENOUS

## 2015-08-24 MED ORDER — ONDANSETRON HCL 4 MG/2ML IJ SOLN: 4.0000 mg | INTRAMUSCULAR | Status: DC | PRN

## 2015-08-24 MED ORDER — THROMBIN 5000 UNITS EX SOLR
CUTANEOUS | Status: DC | PRN
  Administered 2015-08-24 (×2): 5000 [IU] via TOPICAL

## 2015-08-24 MED ORDER — DEXAMETHASONE SODIUM PHOSPHATE 10 MG/ML IJ SOLN
INTRAMUSCULAR | Status: AC
  Filled 2015-08-24: qty 1

## 2015-08-24 MED ORDER — MIDAZOLAM HCL 2 MG/2ML IJ SOLN
INTRAMUSCULAR | Status: AC
  Filled 2015-08-24: qty 2

## 2015-08-24 MED ORDER — BUPIVACAINE LIPOSOME 1.3 % IJ SUSP
20.0000 mL | Freq: Once | INTRAMUSCULAR | Status: DC
  Filled 2015-08-24: qty 20

## 2015-08-24 MED ORDER — ALBUMIN HUMAN 5 % IV SOLN
INTRAVENOUS | Status: DC | PRN
Start: 2015-08-24 — End: 2015-08-24
  Administered 2015-08-24: 11:00:00 via INTRAVENOUS

## 2015-08-24 MED ORDER — HEMOSTATIC AGENTS (NO CHARGE) OPTIME
TOPICAL | Status: DC | PRN
  Administered 2015-08-24: 1 via TOPICAL

## 2015-08-24 MED ORDER — BUPIVACAINE-EPINEPHRINE (PF) 0.5% -1:200000 IJ SOLN
INTRAMUSCULAR | Status: DC | PRN
  Administered 2015-08-24: 10 mL via PERINEURAL

## 2015-08-24 MED ORDER — BISACODYL 10 MG RE SUPP: 10.0000 mg | Freq: Every day | RECTAL | Status: DC | PRN

## 2015-08-24 MED ORDER — ACETAMINOPHEN 650 MG RE SUPP: 650.0000 mg | RECTAL | Status: DC | PRN

## 2015-08-24 MED ORDER — HYDROCODONE-ACETAMINOPHEN 5-325 MG PO TABS
1.0000 | ORAL_TABLET | ORAL | Status: DC | PRN
  Administered 2015-08-24 – 2015-08-26 (×9): 2 via ORAL
  Filled 2015-08-24 (×9): qty 2

## 2015-08-24 MED ORDER — SUCCINYLCHOLINE CHLORIDE 20 MG/ML IJ SOLN
INTRAMUSCULAR | Status: DC | PRN
  Administered 2015-08-24: 120 mg via INTRAVENOUS

## 2015-08-24 MED ORDER — ROCURONIUM BROMIDE 100 MG/10ML IV SOLN
INTRAVENOUS | Status: DC | PRN
  Administered 2015-08-24 (×3): 10 mg via INTRAVENOUS
  Administered 2015-08-24: 50 mg via INTRAVENOUS

## 2015-08-24 MED ORDER — ALUM & MAG HYDROXIDE-SIMETH 200-200-20 MG/5ML PO SUSP: 30.0000 mL | Freq: Four times a day (QID) | ORAL | Status: DC | PRN

## 2015-08-24 MED ORDER — FENTANYL CITRATE (PF) 250 MCG/5ML IJ SOLN
INTRAMUSCULAR | Status: AC
  Filled 2015-08-24: qty 5

## 2015-08-24 MED ORDER — BACITRACIN ZINC 500 UNIT/GM EX OINT
TOPICAL_OINTMENT | CUTANEOUS | Status: DC | PRN
  Administered 2015-08-24: 1 via TOPICAL

## 2015-08-24 MED ORDER — FENTANYL CITRATE (PF) 100 MCG/2ML IJ SOLN
INTRAMUSCULAR | Status: DC | PRN
  Administered 2015-08-24 (×5): 50 ug via INTRAVENOUS

## 2015-08-24 MED ORDER — LISINOPRIL-HYDROCHLOROTHIAZIDE 10-12.5 MG PO TABS: 1.0000 | ORAL_TABLET | Freq: Every day | ORAL | Status: DC

## 2015-08-24 MED ORDER — ACETAMINOPHEN 325 MG PO TABS
650.0000 mg | ORAL_TABLET | ORAL | Status: DC | PRN
  Administered 2015-08-25: 650 mg via ORAL
  Filled 2015-08-24 (×2): qty 2

## 2015-08-24 MED ORDER — HYDROMORPHONE HCL 1 MG/ML IJ SOLN
0.2500 mg | INTRAMUSCULAR | Status: DC | PRN
  Administered 2015-08-24 (×4): 0.5 mg via INTRAVENOUS

## 2015-08-24 MED ORDER — MORPHINE SULFATE (PF) 2 MG/ML IV SOLN
1.0000 mg | INTRAVENOUS | Status: DC | PRN
Start: 2015-08-24 — End: 2015-08-26
  Administered 2015-08-24 (×2): 4 mg via INTRAVENOUS
  Filled 2015-08-24 (×2): qty 2

## 2015-08-24 MED ORDER — 0.9 % SODIUM CHLORIDE (POUR BTL) OPTIME
TOPICAL | Status: DC | PRN
  Administered 2015-08-24: 1000 mL

## 2015-08-24 MED ORDER — MENTHOL 3 MG MT LOZG: 1.0000 | LOZENGE | OROMUCOSAL | Status: DC | PRN

## 2015-08-24 MED ORDER — ARTIFICIAL TEARS OP OINT
TOPICAL_OINTMENT | OPHTHALMIC | Status: AC
  Filled 2015-08-24: qty 3.5

## 2015-08-24 MED ORDER — LACTATED RINGERS IV SOLN
INTRAVENOUS | Status: DC | PRN
  Administered 2015-08-24 (×3): via INTRAVENOUS

## 2015-08-24 MED ORDER — DIAZEPAM 5 MG PO TABS
ORAL_TABLET | ORAL | Status: AC
  Filled 2015-08-24: qty 1

## 2015-08-24 MED ORDER — MIDAZOLAM HCL 5 MG/5ML IJ SOLN
INTRAMUSCULAR | Status: DC | PRN
  Administered 2015-08-24 (×2): 1 mg via INTRAVENOUS

## 2015-08-24 MED ORDER — LISINOPRIL 20 MG PO TABS
10.0000 mg | ORAL_TABLET | Freq: Every day | ORAL | Status: DC
  Administered 2015-08-25: 10 mg via ORAL
  Filled 2015-08-24: qty 1

## 2015-08-24 MED ORDER — DIAZEPAM 5 MG PO TABS
5.0000 mg | ORAL_TABLET | Freq: Four times a day (QID) | ORAL | Status: DC | PRN
  Administered 2015-08-24 – 2015-08-26 (×8): 5 mg via ORAL
  Filled 2015-08-24 (×7): qty 1

## 2015-08-24 MED ORDER — PHENOL 1.4 % MT LIQD: 1.0000 | OROMUCOSAL | Status: DC | PRN

## 2015-08-24 MED ORDER — GLYCOPYRROLATE 0.2 MG/ML IJ SOLN
INTRAMUSCULAR | Status: DC | PRN
  Administered 2015-08-24: 0.6 mg via INTRAVENOUS

## 2015-08-24 MED ORDER — NEOSTIGMINE METHYLSULFATE 10 MG/10ML IV SOLN
INTRAVENOUS | Status: DC | PRN
  Administered 2015-08-24: 4 mg via INTRAVENOUS

## 2015-08-24 MED ORDER — ONDANSETRON HCL 4 MG/2ML IJ SOLN
INTRAMUSCULAR | Status: DC | PRN
  Administered 2015-08-24: 4 mg via INTRAVENOUS

## 2015-08-24 MED ORDER — OXYCODONE-ACETAMINOPHEN 5-325 MG PO TABS
1.0000 | ORAL_TABLET | ORAL | Status: DC | PRN
Start: 2015-08-24 — End: 2015-08-24

## 2015-08-24 MED ORDER — DEXTROSE 5 % IV SOLN
INTRAVENOUS | Status: DC | PRN
  Administered 2015-08-24: 15 ug/min via INTRAVENOUS

## 2015-08-24 MED ORDER — HYDROCHLOROTHIAZIDE 12.5 MG PO CAPS
12.5000 mg | ORAL_CAPSULE | Freq: Every day | ORAL | Status: DC
  Administered 2015-08-25: 12.5 mg via ORAL
  Filled 2015-08-24: qty 1

## 2015-08-24 MED ORDER — MELOXICAM 7.5 MG PO TABS
15.0000 mg | ORAL_TABLET | Freq: Every day | ORAL | Status: DC
  Administered 2015-08-24 – 2015-08-25 (×2): 15 mg via ORAL
  Filled 2015-08-24: qty 1
  Filled 2015-08-24: qty 2
  Filled 2015-08-24 (×2): qty 1

## 2015-08-24 MED ORDER — ARTIFICIAL TEARS OP OINT
TOPICAL_OINTMENT | OPHTHALMIC | Status: DC | PRN
  Administered 2015-08-24: 1 via OPHTHALMIC

## 2015-08-24 SURGICAL SUPPLY — 67 items
BAG DECANTER FOR FLEXI CONT (MISCELLANEOUS) ×3 IMPLANT
BENZOIN TINCTURE PRP APPL 2/3 (GAUZE/BANDAGES/DRESSINGS) ×3 IMPLANT
BIT DRILL NEURO 2X3.1 SFT TUCH (MISCELLANEOUS) ×1 IMPLANT
BLADE CLIPPER SURG (BLADE) IMPLANT
BRUSH SCRUB EZ PLAIN DRY (MISCELLANEOUS) ×3 IMPLANT
BUR MATCHSTICK NEURO 3.0 LAGG (BURR) ×3 IMPLANT
BUR PRECISION FLUTE 6.0 (BURR) ×3 IMPLANT
CANISTER SUCT 3000ML PPV (MISCELLANEOUS) ×3 IMPLANT
CLOSURE WOUND 1/2 X4 (GAUZE/BANDAGES/DRESSINGS) ×1
DRAPE LAPAROTOMY 100X72X124 (DRAPES) ×3 IMPLANT
DRAPE MICROSCOPE LEICA (MISCELLANEOUS) ×3 IMPLANT
DRAPE POUCH INSTRU U-SHP 10X18 (DRAPES) ×3 IMPLANT
DRAPE SURG 17X23 STRL (DRAPES) ×12 IMPLANT
DRILL NEURO 2X3.1 SOFT TOUCH (MISCELLANEOUS) ×3
ELECT BLADE 4.0 EZ CLEAN MEGAD (MISCELLANEOUS) ×3
ELECT REM PT RETURN 9FT ADLT (ELECTROSURGICAL) ×3
ELECTRODE BLDE 4.0 EZ CLN MEGD (MISCELLANEOUS) ×1 IMPLANT
ELECTRODE REM PT RTRN 9FT ADLT (ELECTROSURGICAL) ×1 IMPLANT
EXPAREL ×3 IMPLANT
GAUZE SPONGE 4X4 12PLY STRL (GAUZE/BANDAGES/DRESSINGS) ×3 IMPLANT
GAUZE SPONGE 4X4 16PLY XRAY LF (GAUZE/BANDAGES/DRESSINGS) ×3 IMPLANT
GLOVE BIO SURGEON STRL SZ7 (GLOVE) ×3 IMPLANT
GLOVE BIO SURGEON STRL SZ8 (GLOVE) ×9 IMPLANT
GLOVE BIO SURGEON STRL SZ8.5 (GLOVE) ×3 IMPLANT
GLOVE BIOGEL PI IND STRL 7.0 (GLOVE) ×1 IMPLANT
GLOVE BIOGEL PI IND STRL 7.5 (GLOVE) ×2 IMPLANT
GLOVE BIOGEL PI INDICATOR 7.0 (GLOVE) ×2
GLOVE BIOGEL PI INDICATOR 7.5 (GLOVE) ×4
GLOVE EXAM NITRILE LRG STRL (GLOVE) IMPLANT
GLOVE EXAM NITRILE MD LF STRL (GLOVE) IMPLANT
GLOVE EXAM NITRILE XL STR (GLOVE) IMPLANT
GLOVE EXAM NITRILE XS STR PU (GLOVE) IMPLANT
GLOVE SS BIOGEL STRL SZ 7 (GLOVE) ×4 IMPLANT
GLOVE SUPERSENSE BIOGEL SZ 7 (GLOVE) ×8
GOWN STRL REUS W/ TWL LRG LVL3 (GOWN DISPOSABLE) IMPLANT
GOWN STRL REUS W/ TWL XL LVL3 (GOWN DISPOSABLE) ×1 IMPLANT
GOWN STRL REUS W/TWL 2XL LVL3 (GOWN DISPOSABLE) IMPLANT
GOWN STRL REUS W/TWL LRG LVL3 (GOWN DISPOSABLE)
GOWN STRL REUS W/TWL XL LVL3 (GOWN DISPOSABLE) ×2
GRAFT DURAGEN MATRIX 1WX1L (Tissue) ×3 IMPLANT
HEMOSTAT POWDER KIT SURGIFOAM (HEMOSTASIS) ×3 IMPLANT
KIT BASIN OR (CUSTOM PROCEDURE TRAY) ×3 IMPLANT
KIT ROOM TURNOVER OR (KITS) ×3 IMPLANT
NEEDLE HYPO 21X1.5 SAFETY (NEEDLE) ×3 IMPLANT
NEEDLE HYPO 22GX1.5 SAFETY (NEEDLE) ×3 IMPLANT
NS IRRIG 1000ML POUR BTL (IV SOLUTION) ×3 IMPLANT
PACK LAMINECTOMY NEURO (CUSTOM PROCEDURE TRAY) ×3 IMPLANT
PAD ARMBOARD 7.5X6 YLW CONV (MISCELLANEOUS) ×9 IMPLANT
PATTIES SURGICAL .5 X.5 (GAUZE/BANDAGES/DRESSINGS) ×18 IMPLANT
PATTIES SURGICAL .5 X1 (DISPOSABLE) IMPLANT
PATTIES SURGICAL .5 X3 (DISPOSABLE) ×3 IMPLANT
PATTIES SURGICAL 1X1 (DISPOSABLE) ×6 IMPLANT
RUBBERBAND STERILE (MISCELLANEOUS) ×6 IMPLANT
SPONGE NEURO XRAY DETECT 1X3 (DISPOSABLE) ×3 IMPLANT
SPONGE SURGIFOAM ABS GEL 100 (HEMOSTASIS) ×3 IMPLANT
SPONGE SURGIFOAM ABS GEL SZ50 (HEMOSTASIS) ×3 IMPLANT
STRIP CLOSURE SKIN 1/2X4 (GAUZE/BANDAGES/DRESSINGS) ×2 IMPLANT
SUT ETHILON 2 0 FS 18 (SUTURE) ×6 IMPLANT
SUT PROLENE 6 0 BV (SUTURE) ×9 IMPLANT
SUT VIC AB 1 CT1 18XBRD ANBCTR (SUTURE) ×1 IMPLANT
SUT VIC AB 1 CT1 8-18 (SUTURE) ×2
SUT VIC AB 2-0 CP2 18 (SUTURE) ×3 IMPLANT
SYR 20CC LL (SYRINGE) ×3 IMPLANT
TAPE CLOTH SURG 4X10 WHT LF (GAUZE/BANDAGES/DRESSINGS) ×3 IMPLANT
TOWEL OR 17X24 6PK STRL BLUE (TOWEL DISPOSABLE) ×3 IMPLANT
TOWEL OR 17X26 10 PK STRL BLUE (TOWEL DISPOSABLE) ×3 IMPLANT
WATER STERILE IRR 1000ML POUR (IV SOLUTION) ×3 IMPLANT

## 2015-08-24 NOTE — Op Note (Signed)
Brief history: The patient is a 45 year old obese Hispanic female who has complained of thoracic pain with bilateral lower extremity pain and numbness and weakness. She has failed medical management and was worked up with a thoracic MRI. This demonstrated spinal stenosis at T9-10 and T10-11. I discussed the situation with the patient. We discussed the various treatment options including surgery. She has weighed the risks, benefits, and alternatives to surgery and decided to proceed with a T9-10 and T10-11 laminectomy.  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 Earle Gell  Asst.: Dr. Sherley Bounds  Anesthesia: Gen. endotracheal  Estimated blood loss: 250 mL  Drains: None  Complications: Durotomy  Description of procedure: The patient was brought to the operating room by the anesthesia team. General endotracheal anesthesia was induced. The patient was turned to the prone position on the Wilson frame. The patient's lumbosacral region was then prepared with Betadine scrub and Betadine solution. Sterile drapes were applied.  I then injected the area to be incised with Marcaine with epinephrine solution. I then used a scalpel to make a linear midline incision over the T9-T10 and T10-11 intervertebral disc space. I then used electrocautery to perform a bilateral subperiosteal dissection exposing the spinous process and lamina of T9, T10, T11. We obtained intraoperative radiograph to confirm our location. I then inserted the [facility] retractor for exposure. I removed the spinous process at T9 and T10 with the [place] rongeur. I removed the caudal aspect of the T8 and cephalad aspect of the T11 spinous process.  We then brought the operative microscope into the field. Under its magnification and illumination we  completed the microdissection. I used a high-speed drill to perform a laminotomy at T9-10 and T10-11 bilaterally. I then used a Kerrison punches to complete the T9 and T10 laminectomy and widen the laminotomy site at T8 and remove the cephalad aspect of the T11 lamina and removed the ligamentum flavum at T9-10 and T10-11. The dura at T10-11 was practically nonexistent and was continuous with the ligament. In performing the laminectomy and removal of the ligamentum flavum we did create a durotomy. There was leakage of spinal fluid. When the spinal fluid leak the epidurals veins began bleeding more. After we had removed the ligamentum flavum there was a large piece of the dura missing. This could not be repaired primarily. I repaired it with a DuraGen patch. I secured the patch with several interrupted 6-0 Prolene's.   At this point we had a good decompression. We then obtained hemostasis using bipolar electrocautery. We irrigated the wound out with bacitracin solution. We then removed the retractor. We then reapproximated the patient's thoracolumbar fascia with interrupted #1 Vicryl suture. We then reapproximated the patient's subcutaneous tissue with interrupted 2-0 Vicryl suture. We then reapproximated patient's skin with a running 20 nylon suture. The drapes were removed. The patient was subsequently returned to the supine position where they were extubated by the anesthesia team. The patient was then transported to the postanesthesia care unit in stable condition. All sponge instrument and needle counts were reportedly correct at the end of this case.

## 2015-08-24 NOTE — Progress Notes (Signed)
Pt napping at intervals.  When awakened, c/o pain 8/10. VSS and she looks comfortable when left alone.   Dr Smith Robert fully updated re pain & meds given. New order for IV Ofirmev. Will cont to monitor.

## 2015-08-24 NOTE — Progress Notes (Signed)
Patient ID: Taylor Valencia, female   DOB: 1970/08/18, 45 y.o.   MRN: [id number] Subjective:  The patient is somnolent but easily arousable.  Objective: Vital signs in last 24 hours: Temp:  [97.2 F (36.2 C)-97.8 F (36.6 C)] 97.2 F (36.2 C) (07/27 1225) Pulse Rate:  [68-71] 71 (07/27 1229) Resp:  [12-18] 12 (07/27 1229) BP: (102-119)/(59-65) 102/59 (07/27 1229) SpO2:  [99 %-100 %] 100 % (07/27 1229) Weight:  [111.4 kg (245 lb 9.5 oz)] 111.4 kg (245 lb 9.5 oz) (07/26 1406)  Intake/Output from previous day: No intake/output data recorded. Intake/Output this shift: Total I/O In: 2600 [I.V.:2300; IV Piggyback:300] Out: 700 [Blood:700]  Physical exam the patient is somewhat but arousable. Her strength is grossly normal in the lower extremities.  Lab Results: No results for input(s): WBC, HGB, HCT, PLT in the last 72 hours. BMET No results for input(s): NA, K, CL, CO2, GLUCOSE, BUN, CREATININE, CALCIUM in the last 72 hours.  Studies/Results: Dg Thoracolumabar Spine  Result Date: 08/24/2015 CLINICAL DATA:  Thoracic Laminectomy T9-10, T10-11, EXAM: THORACOLUMBAR SPINE 1V COMPARISON:  None. FINDINGS: FILM #1: 8:00:31: A surgical instrument is identified posterior to the lower thoracic spine, posterior to the vertebral body of T10. FILM #2: 8:01:18: Surgical instrument is identified posterior to the vertebral body at T10. FILM #3: 8:05:39: Surgical instruments overlying the disc space at T10-11. FILM #4: 8:40:31: Pain posterior retractor is in place. A surgical instrument overlies T9-10. IMPRESSION: Intraoperative images during laminectomy at T9-10 and T11. Electronically Signed   By: Nolon Nations M.D.   On: 08/24/2015 11:19   Assessment/Plan: The patient is doing well. We will keep her head of bed elevated to 30. I spoke with family.  LOS: 0 days     Taylor Valencia D 08/24/2015, 12:32 PM

## 2015-08-24 NOTE — Anesthesia Procedure Notes (Signed)
Procedure Name: Intubation Date/Time: 08/24/2015 7:48 AM Performed by: Scheryl Darter Pre-anesthesia Checklist: Patient identified, Emergency Drugs available, Suction available and Patient being monitored Patient Re-evaluated:Patient Re-evaluated prior to inductionOxygen Delivery Method: Circle System Utilized Preoxygenation: Pre-oxygenation with 100% oxygen Intubation Type: IV induction Ventilation: Mask ventilation without difficulty Laryngoscope Size: Glidescope Tube type: Oral Tube size: 7.0 mm Number of attempts: 1 Airway Equipment and Method: Stylet,  Oral airway and Video-laryngoscopy Placement Confirmation: ETT inserted through vocal cords under direct vision,  positive ETCO2 and breath sounds checked- equal and bilateral Secured at: 22 cm Tube secured with: Tape Dental Injury: Teeth and Oropharynx as per pre-operative assessment  Difficulty Due To: Difficulty was anticipated, Difficult Airway- due to reduced neck mobility, Difficult Airway- due to large tongue, Difficult Airway- due to anterior larynx and Difficult Airway- due to limited oral opening

## 2015-08-24 NOTE — Anesthesia Postprocedure Evaluation (Signed)
Anesthesia Post Note  Patient: Research scientist (medical)  Procedure(s) Performed: Procedure(s) (LRB): Thoracic nine- ten, Thoracic ten- eleven Laminectomy (N/A)  Patient location during evaluation: PACU Anesthesia Type: General Level of consciousness: awake and alert Pain management: pain level controlled Vital Signs Assessment: post-procedure vital signs reviewed and stable Respiratory status: spontaneous breathing, nonlabored ventilation, respiratory function stable and patient connected to nasal cannula oxygen Cardiovascular status: blood pressure returned to baseline and stable Postop Assessment: no signs of nausea or vomiting Anesthetic complications: no    Last Vitals:  Vitals:   08/24/15 1330 08/24/15 1344  BP: 114/73 121/77  Pulse: 65 68  Resp: 10 17  Temp:  36.7 C    Last Pain:  Vitals:   08/24/15 1344  TempSrc:   PainSc: Taylor Valencia

## 2015-08-24 NOTE — Anesthesia Preprocedure Evaluation (Addendum)
Anesthesia Evaluation  Patient identified by MRN, date of birth, ID band Patient awake    Reviewed: Allergy & Precautions, NPO status , Patient's Chart, lab work & pertinent test results  Airway Mallampati: III  TM Distance: >3 FB Neck ROM: Full    Dental  (+) Teeth Intact, Dental Advisory Given   Pulmonary neg pulmonary ROS,    breath sounds clear to auscultation       Cardiovascular hypertension,  Rhythm:Regular Rate:Normal     Neuro/Psych negative neurological ROS  negative psych ROS   GI/Hepatic negative GI ROS, Neg liver ROS,   Endo/Other  negative endocrine ROS  Renal/GU negative Renal ROS  negative genitourinary   Musculoskeletal  (+) Arthritis ,   Abdominal   Peds negative pediatric ROS (+)  Hematology negative hematology ROS (+)   Anesthesia Other Findings   Reproductive/Obstetrics negative OB ROS                            Lab Results  Component Value Date   WBC 13.2 (H) 08/16/2015   HGB 14.0 08/16/2015   HCT 40.8 08/16/2015   MCV 82.9 08/16/2015   PLT 276 08/16/2015   Lab Results  Component Value Date   CREATININE 0.49 08/16/2015   BUN 16 08/16/2015   NA 135 08/16/2015   K 3.6 08/16/2015   CL 108 08/16/2015   CO2 21 (L) 08/16/2015   No results found for: INR, PROTIME  07/2015 EKG: normal sinus rhythm.   Anesthesia Physical Anesthesia Plan  ASA: III  Anesthesia Plan: General   Post-op Pain Management:    Induction: Intravenous  Airway Management Planned: Oral ETT and Video Laryngoscope Planned  Additional Equipment:   Intra-op Plan:   Post-operative Plan: Extubation in OR  Informed Consent: I have reviewed the patients History and Physical, chart, labs and discussed the procedure including the risks, benefits and alternatives for the proposed anesthesia with the patient or authorized representative who has indicated his/her understanding and  acceptance.   Dental advisory given  Plan Discussed with: CRNA  Anesthesia Plan Comments:        Anesthesia Quick Evaluation

## 2015-08-24 NOTE — Transfer of Care (Signed)
Immediate Anesthesia Transfer of Care Note  Patient: Taylor Valencia  Procedure(s) Performed: Procedure(s) with comments: Thoracic nine- ten, Thoracic ten- eleven Laminectomy (N/A) - T9-10, T10-11 Laminectomy  Patient Location: PACU  Anesthesia Type:General  Level of Consciousness: awake, alert , oriented and sedated  Airway & Oxygen Therapy: Patient Spontanous Breathing and Patient connected to face mask oxygen  Post-op Assessment: Report given to RN, Post -op Vital signs reviewed and stable and Patient moving all extremities  Post vital signs: Reviewed and stable  Last Vitals:  Vitals:   08/24/15 0618  BP: 119/65  Pulse: 68  Resp: 18  Temp: 36.6 C    Last Pain:  Vitals:   08/24/15 0618  TempSrc: Oral  PainSc: 4       Patients Stated Pain Goal: 3 ([id number])  Complications: No apparent anesthesia complications

## 2015-08-24 NOTE — Progress Notes (Signed)
Patient ID: Taylor Valencia, female   DOB: March 20, 1970, 45 y.o.   MRN: [id number] Subjective:  The patient is alert and pleasant. She is in no apparent distress.  Objective: Vital signs in last 24 hours: Temp:  [97.2 F (36.2 C)-98.2 F (36.8 C)] 98.2 F (36.8 C) (07/27 1424) Pulse Rate:  [65-73] 73 (07/27 1424) Resp:  [6-22] 16 (07/27 1424) BP: (95-128)/(54-77) 95/54 (07/27 1424) SpO2:  [98 %-100 %] 98 % (07/27 1424)  Intake/Output from previous day: No intake/output data recorded. Intake/Output this shift: Total I/O In: 3400 [P.O.:100; I.V.:3000; IV Piggyback:300] Out: 700 [Blood:700]  Physical exam the patient is alert and pleasant. Her strength is grossly normal vital quadriceps, gastrocnemius, and dorsiflexors.  Lab Results: No results for input(s): WBC, HGB, HCT, PLT in the last 72 hours. BMET No results for input(s): NA, K, CL, CO2, GLUCOSE, BUN, CREATININE, CALCIUM in the last 72 hours.  Studies/Results: Dg Thoracolumabar Spine  Result Date: 08/24/2015 CLINICAL DATA:  Thoracic Laminectomy T9-10, T10-11, EXAM: THORACOLUMBAR SPINE 1V COMPARISON:  None. FINDINGS: FILM #1: 8:00:31: A surgical instrument is identified posterior to the lower thoracic spine, posterior to the vertebral body of T10. FILM #2: 8:01:18: Surgical instrument is identified posterior to the vertebral body at T10. FILM #3: 8:05:39: Surgical instruments overlying the disc space at T10-11. FILM #4: 8:40:31: Pain posterior retractor is in place. A surgical instrument overlies T9-10. IMPRESSION: Intraoperative images during laminectomy at T9-10 and T11. Electronically Signed   By: Nolon Nations M.D.   On: 08/24/2015 11:19   Assessment/Plan: The patient is doing well.  LOS: 0 days     Rayen Palen D 08/24/2015, 2:52 PM

## 2015-08-24 NOTE — Progress Notes (Signed)
VS upon adm to PACU not slaving over to [facility] charting. This was reported to [employer] [facility] [employer]. Initial BP 102/58, HR 70's, RR 18, Os sat 100 %

## 2015-08-24 NOTE — H&P (Signed)
Subjective: The patient is a 45 year old Hispanic female who has complained of thoracic pain with leg pain numbness and weakness. She has failed medical management and was worked up with a thoracic MRI. This demonstrated spinal stenosis at T9-10 and T10-11. I discussed the situation with the patient and her husband. We discussed the various treatment options. She has decided to proceed with surgery.   Past Medical History:  Diagnosis Date  . Arthritis   . Chronic back pain   . History of bronchitis   . Hypertension     Past Surgical History:  Procedure Laterality Date  . BOWEL RESECTION     patient had some muscle removed   . CHOLECYSTECTOMY    . EYE SURGERY     lasix surgery  . TONSILLECTOMY      Allergies  Allergen Reactions  . No Known Allergies     Social History  Substance Use Topics  . Smoking status: Never Smoker  . Smokeless tobacco: Never Used  . Alcohol use No    History reviewed. No pertinent family history. Prior to Admission medications   Medication Sig Start Date End Date Taking? Authorizing Provider  Biotin w/ Vitamins C & E (HAIR SKIN & NAILS GUMMIES) 1250-7.5-7.5 MCG-MG-UNT CHEW Chew 2 tablets by mouth daily.   Yes Historical Provider, MD  carboxymethylcellulose (REFRESH PLUS) 0.5 % SOLN Place 1 drop into both eyes 3 (three) times daily as needed.   Yes Historical Provider, MD  Gymnema Sylvestris Leaf POWD 1 Dose by Does not apply route 2 (two) times daily.   Yes Historical Provider, MD  lisinopril-hydrochlorothiazide (PRINZIDE,ZESTORETIC) 10-12.5 MG tablet Take 1 tablet by mouth daily.   Yes Historical Provider, MD  meloxicam (MOBIC) 15 MG tablet TAKE ONE TABLET BY MOUTH ONCE DAILY 10/04/14  Yes Historical Provider, MD  tiZANidine (ZANAFLEX) 2 MG tablet Take 2 mg by mouth at bedtime.   Yes Historical Provider, MD  traMADol (ULTRAM) 50 MG tablet Take 50 mg by mouth every 6 (six) hours as needed for moderate pain.   Yes Historical Provider, MD     Review of  Systems  Positive ROS: As above  All other systems have been reviewed and were otherwise negative with the exception of those mentioned in the HPI and as above.  Objective: Vital signs in last 24 hours: Temp:  [97.8 F (36.6 C)] 97.8 F (36.6 C) (07/27 0618) Pulse Rate:  [68] 68 (07/27 0618) Resp:  [18] 18 (07/27 0618) BP: (119)/(65) 119/65 (07/27 0618) SpO2:  [99 %] 99 % (07/27 0618) Weight:  [111.4 kg (245 lb 9.5 oz)] 111.4 kg (245 lb 9.5 oz) (07/26 1406)  General Appearance: Alert, cooperative, no distress, Head: Normocephalic, without obvious abnormality, atraumatic Eyes: PERRL, conjunctiva/corneas clear, EOM's intact,    Ears: Normal  Throat: Normal  Neck: Supple, symmetrical, trachea midline, no adenopathy; thyroid: No enlargement/tenderness/nodules; no carotid bruit or JVD Back: Symmetric, no curvature, ROM normal, no CVA tenderness Lungs: Clear to auscultation bilaterally, respirations unlabored Heart: Regular rate and rhythm, no murmur, rub or gallop Abdomen: Soft, non-tender,, no masses, no organomegaly Extremities: Extremities normal, atraumatic, no cyanosis or edema Pulses: 2+ and symmetric all extremities Skin: Skin color, texture, turgor normal, no rashes or lesions  NEUROLOGIC:   Mental status: alert and oriented, no aphasia, good attention span, Fund of knowledge/ memory ok Motor Exam - grossly normal Sensory Exam - grossly normal Reflexes:  Coordination - grossly normal Gait - grossly normal Balance - grossly normal Cranial Nerves: I: smell  Not tested  II: visual acuity  OS: Normal  OD: Normal   II: visual fields Full to confrontation  II: pupils Equal, round, reactive to light  III,VII: ptosis None  III,IV,VI: extraocular muscles  Full ROM  V: mastication Normal  V: facial light touch sensation  Normal  V,VII: corneal reflex  Present  VII: facial muscle function - upper  Normal  VII: facial muscle function - lower Normal  VIII: hearing Not tested   IX: soft palate elevation  Normal  IX,X: gag reflex Present  XI: trapezius strength  5/5  XI: sternocleidomastoid strength 5/5  XI: neck flexion strength  5/5  XII: tongue strength  Normal    Data Review Lab Results  Component Value Date   WBC 13.2 (H) 08/16/2015   HGB 14.0 08/16/2015   HCT 40.8 08/16/2015   MCV 82.9 08/16/2015   PLT 276 08/16/2015   Lab Results  Component Value Date   NA 135 08/16/2015   K 3.6 08/16/2015   CL 108 08/16/2015   CO2 21 (L) 08/16/2015   BUN 16 08/16/2015   CREATININE 0.49 08/16/2015   GLUCOSE 96 08/16/2015   No results found for: INR, PROTIME  Assessment/Plan: 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 I reviewed the MRI scan with him. We have discussed the various treatment options including surgery. I have described the surgical treatment option of a T9-10 and T10-11 laminectomy. I have shown her surgical models. We have discussed the risks, benefits, alternatives, likely postoperative course, and the likelihood of achieving her goals with surgery. I have answered all the patient's and her husband's questions. She has decided to proceed with surgery.   Sreeja Spies D 08/24/2015 7:22 AM

## 2015-08-25 ENCOUNTER — Encounter (HOSPITAL_COMMUNITY): Payer: Self-pay | Admitting: Neurosurgery

## 2015-08-25 DIAGNOSIS — M4804 Spinal stenosis, thoracic region: Secondary | ICD-10-CM | POA: Diagnosis not present

## 2015-08-25 MED ORDER — GABAPENTIN 300 MG PO CAPS
300.0000 mg | ORAL_CAPSULE | Freq: Three times a day (TID) | ORAL | Status: DC
  Administered 2015-08-25 – 2015-08-26 (×4): 300 mg via ORAL
  Filled 2015-08-25 (×4): qty 1

## 2015-08-25 NOTE — Evaluation (Signed)
Physical Therapy Evaluation Patient Details Name: Jariyah Kassel MRN: [id number] DOB: 1970/12/30 Today's Date: 08/25/2015   History of Present Illness  45 yo female s/p T9-T11 laminectomy 7/27  Clinical Impression  On eval, pt required Min assist for mobility. Pt sitting in fold up chair beside bed. She requested assistance back to bed. Pt rated pain 9/10 at rest. Pt was able to stand and pivot from chair to bed with Min assist and increased time. Unable to tolerate ambulation at this time due to pain. Will continue to follow and progress activity as tolerated. Pt may need a RW for ambulation at discharge. Recommend HHPT follow up .     Follow Up Recommendations Home health PT;Supervision/Assistance - 24 hour    Equipment Recommendations  Rolling walker with 5" wheels (may need -will continue to assess)    Recommendations for Other Services       Precautions / Restrictions Precautions Precautions: Fall;Back Restrictions Weight Bearing Restrictions: No      Mobility  Bed Mobility Overal bed mobility: Needs Assistance Bed Mobility: Sit to Sidelying         Sit to sidelying: Min assist General bed mobility comments: Increased time. VCs for safety, technique. Assist for LEs onto bed.   Transfers Overall transfer level: Needs assistance   Transfers: Sit to/from Stand;Stand Pivot Transfers Sit to Stand: Min assist Stand pivot transfers: Min assist       General transfer comment: Increased time. Assist to rise, stabilize, control descent. VC safety, hand placement. Stand pivot from fold up chair to bed. Encouraged pt to sit in chair with armrests instead.   Ambulation/Gait             General Gait Details: NT-pt reported 9/10 pain at rest at start of session. Pt did not  feel she could tolerate ambulation at this time.   Stairs            Wheelchair Mobility    Modified Rankin (Stroke Patients Only)       Balance                                              Pertinent Vitals/Pain Pain Assessment: 0-10 Pain Score: 9  Pain Location: back, R hip Pain Descriptors / Indicators: Sharp;Aching Pain Intervention(s): Limited activity within patient's tolerance;Repositioned;Premedicated before session    [place] expects to be discharged to:: Private residence Living Arrangements: Spouse/significant other Available Help at Discharge: Family Type of Home: Apartment Home Access: Stairs to enter Entrance Stairs-Rails: Psychiatric nurse of Steps: 8 Home Layout: One level Home Equipment: None      Prior Function Level of Independence: Independent               Hand Dominance        Extremity/Trunk Assessment               Lower Extremity Assessment:  (unable to assess due to pain. Pt able to weightbear but c/o R hip/Le pain)      Cervical / Trunk Assessment: Normal  Communication   Communication: No difficulties  Cognition Arousal/Alertness: Awake/alert Behavior During Therapy: WFL for tasks assessed/performed Overall Cognitive Status: Within Functional Limits for tasks assessed                      General Comments  Exercises        Assessment/Plan    PT Assessment Patient needs continued PT services  PT Diagnosis Difficulty walking;Acute pain   PT Problem List Decreased activity tolerance;Decreased balance;Decreased mobility;Decreased knowledge of use of DME;Pain;Decreased knowledge of precautions  PT Treatment Interventions DME instruction;Gait training;Stair training;Functional mobility training;Balance training;Therapeutic exercise;Therapeutic activities;Patient/family education   PT Goals (Current goals can be found in the Care Plan section) Acute Rehab PT Goals Patient Stated Goal: less pain PT Goal Formulation: With patient Time For Goal Achievement: 09/08/15 Potential to Achieve Goals: Good    Frequency Min 5X/week    Barriers to discharge        Co-evaluation               End of Session   Activity Tolerance: Patient limited by pain Patient left: in bed;with call bell/phone within reach;with family/visitor present      Functional Assessment Tool Used: clinical judgement Functional Limitation: Mobility: Walking and moving around Mobility: Walking and Moving Around Current Status [id number]): At least 20 percent but less than 40 percent impaired, limited or restricted Mobility: Walking and Moving Around Goal Status [phone]): At least 1 percent but less than 20 percent impaired, limited or restricted    Time: 0910-0918 PT Time Calculation (min) (ACUTE ONLY): 8 min   Charges:   PT Evaluation $PT Eval Low Complexity: 1 Procedure     PT G Codes:   PT G-Codes **NOT FOR INPATIENT CLASS** Functional Assessment Tool Used: clinical judgement Functional Limitation: Mobility: Walking and moving around Mobility: Walking and Moving Around Current Status [id number]): At least 20 percent but less than 40 percent impaired, limited or restricted Mobility: Walking and Moving Around Goal Status [phone]): At least 1 percent but less than 20 percent impaired, limited or restricted    Weston Anna, MPT Pager: [phone]

## 2015-08-25 NOTE — Progress Notes (Signed)
Patient ID: Taylor Valencia, female   DOB: July 09, 1970, 45 y.o.   MRN: [id number] Subjective:  The patient is alert and pleasant. She looks well. Her back is appropriately sore. She complains of occasional "nerve pain" in her legs. She walked the hallways yesterday. She denies headaches.  Objective: Vital signs in last 24 hours: Temp:  [97.2 F (36.2 C)-101.7 F (38.7 C)] 101.6 F (38.7 C) (07/28 0600) Pulse Rate:  [65-97] 97 (07/28 0400) Resp:  [6-22] 18 (07/28 0400) BP: (95-128)/(54-77) 95/58 (07/28 0400) SpO2:  [96 %-100 %] 96 % (07/28 0400)  Intake/Output from previous day: 07/27 0701 - 07/28 0700 In: 3640 [P.O.:340; I.V.:3000; IV Piggyback:300] Out: 700 [Blood:700] Intake/Output this shift: No intake/output data recorded.  Physical exam the patient is alert and pleasant. Her dressing is clean and dry. Strength is grossly normal and her bilateral gastrocnemius and dorsiflexors.  Lab Results: No results for input(s): WBC, HGB, HCT, PLT in the last 72 hours. BMET No results for input(s): NA, K, CL, CO2, GLUCOSE, BUN, CREATININE, CALCIUM in the last 72 hours.  Studies/Results: Dg Thoracolumabar Spine  Result Date: 08/24/2015 CLINICAL DATA:  Thoracic Laminectomy T9-10, T10-11, EXAM: THORACOLUMBAR SPINE 1V COMPARISON:  None. FINDINGS: FILM #1: 8:00:31: A surgical instrument is identified posterior to the lower thoracic spine, posterior to the vertebral body of T10. FILM #2: 8:01:18: Surgical instrument is identified posterior to the vertebral body at T10. FILM #3: 8:05:39: Surgical instruments overlying the disc space at T10-11. FILM #4: 8:40:31: Pain posterior retractor is in place. A surgical instrument overlies T9-10. IMPRESSION: Intraoperative images during laminectomy at T9-10 and T11. Electronically Signed   By: Nolon Nations M.D.   On: 08/24/2015 11:19   Assessment/Plan: Postop day #1: The patient seems to be progressing well. We will continue to work with her to  ambulate her. I'll add Neurontin for her "nerve pain". She will likely go home over the weekend.  LOS: 0 days     Gurpreet Mikhail D 08/25/2015, 8:01 AM

## 2015-08-25 NOTE — Care Management Note (Signed)
Case Management Note  Patient Details  Name: Taylor Valencia MRN: [id number] Date of Birth: 12-Aug-1970  Subjective/Objective:    45 yr old female s/p T9-T11 laminectomy.                 Action/Plan:  Case manager spoke with patient concerning home health needs. Choice was offered for Home Health agency. Referral was called to Aquilla Solian, [place] Liaison. Patient states her mom and nephew will assist her at home. Her husband is curently a patient in [place]. Patient has expressed concerns regarding him. Emotional support given.   Expected Discharge Date:   08/26/15               Expected Discharge Plan:  [place]  In-House Referral:  NA  Discharge planning Services  CM Consult  Post Acute Care Choice:  Home Health Choice offered to:  Patient  DME Arranged:  N/A DME Agency:     HH Arranged:    [place] Agency:  [place]  Status of Service:  Completed, signed off  If discussed at [place] of Stay Meetings, dates discussed:    Additional Comments:  Ninfa Meeker, RN 08/25/2015, 11:11 AM

## 2015-08-26 DIAGNOSIS — M4804 Spinal stenosis, thoracic region: Secondary | ICD-10-CM | POA: Diagnosis not present

## 2015-08-26 MED ORDER — TIZANIDINE HCL 2 MG PO TABS: 2.0000 mg | ORAL_TABLET | Freq: Every day | ORAL | 0 refills | Status: DC

## 2015-08-26 MED ORDER — DIAZEPAM 5 MG PO TABS: 5.0000 mg | ORAL_TABLET | Freq: Four times a day (QID) | ORAL | 0 refills | Status: DC | PRN

## 2015-08-26 MED ORDER — HYDROCODONE-ACETAMINOPHEN 5-325 MG PO TABS: 1.0000 | ORAL_TABLET | ORAL | 0 refills | Status: DC | PRN

## 2015-08-26 MED ORDER — GABAPENTIN 300 MG PO CAPS: 300.0000 mg | ORAL_CAPSULE | Freq: Three times a day (TID) | ORAL | 2 refills | Status: DC

## 2015-08-26 NOTE — Progress Notes (Signed)
Pt and family given D/C instructions with Rx's, verbal understanding was provided. Pt's incision is clean and dry with no sign of infection. Pt's IV was removed prior to D/C. Pt's HH was arranged prior to D/C. Pt D/C'd home via wheelchair @ 1005 per MD order. Pt is stable @ D/C and has no other needs at this time. Holli Humbles, RN

## 2015-08-26 NOTE — Discharge Instructions (Signed)

## 2015-08-26 NOTE — Discharge Summary (Signed)
  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  They were discharged in stable condition.  Follow up: 3 weeks    Medication List    TAKE these medications   carboxymethylcellulose 0.5 % Soln Commonly known as:  REFRESH PLUS Place 1 drop into both eyes 3 (three) times daily as needed.   diazepam 5 MG tablet Commonly known as:  VALIUM Take 1 tablet (5 mg total) by mouth every 6 (six) hours as needed for muscle spasms.   gabapentin 300 MG capsule Commonly known as:  NEURONTIN Take 1 capsule (300 mg total) by mouth 3 (three) times daily.   Gymnema Sylvestris Leaf Powd 1 Dose by Does not apply route 2 (two) times daily.   HAIR SKIN & NAILS GUMMIES 1250-7.5-7.5 MCG-MG-UNT Chew Generic drug:  Biotin w/ Vitamins C & E Chew 2 tablets by mouth daily.   HYDROcodone-acetaminophen 5-325 MG tablet Commonly known as:  NORCO/VICODIN Take 1-2 tablets by mouth every 4 (four) hours as needed for moderate pain.   lisinopril-hydrochlorothiazide 10-12.5 MG tablet Commonly known as:  PRINZIDE,ZESTORETIC Take 1 tablet by mouth daily.   meloxicam 15 MG tablet Commonly known as:  MOBIC TAKE ONE TABLET BY MOUTH ONCE DAILY   tiZANidine 2 MG tablet Commonly known as:  ZANAFLEX Take 1 tablet (2 mg total) by mouth at bedtime.   traMADol 50 MG tablet Commonly known as:  ULTRAM Take 50 mg by mouth every 6 (six) hours as needed for moderate pain.

## 2015-08-26 NOTE — Progress Notes (Signed)
Physical Therapy Treatment Patient Details Name: Taylor Valencia MRN: [id number] DOB: Jun 09, 1970 Today's Date: 08/26/2015    History of Present Illness 45 yo female s/p T9-T11 laminectomy 7/27    PT Comments    Patient seen for mobility progression. Educated on car transfers, mobility expectations, stair negotation, and pain management strategies. Anticipate will be safe for d/c home.  Follow Up Recommendations  Home health PT;Supervision/Assistance - 24 hour     Equipment Recommendations  Rolling walker with 5" wheels (may need -will continue to assess)    Recommendations for Other Services       Precautions / Restrictions Precautions Precautions: Fall;Back Restrictions Weight Bearing Restrictions: No    Mobility  Bed Mobility Overal bed mobility: Needs Assistance Bed Mobility: Sit to Sidelying         Sit to sidelying: Supervision General bed mobility comments: Increased time and effort  Transfers Overall transfer level: Needs assistance Equipment used: Rolling walker (2 wheeled) Transfers: Sit to/from Stand Sit to Stand: Supervision         General transfer comment: Vcs for hand placement  Ambulation/Gait Ambulation/Gait assistance: Supervision Ambulation Distance (Feet): 340 Feet Assistive device: Rolling walker (2 wheeled) Gait Pattern/deviations: Step-through pattern;Decreased stride length;Antalgic Gait velocity: decreased Gait velocity interpretation: Below normal speed for age/gender General Gait Details: Slow guarded gait, encouraged increased cadence   Stairs Stairs: Yes Stairs assistance: Min assist Stair Management: One rail Right Number of Stairs: 4 General stair comments: min assist for LUE support and cues for sequencing  Wheelchair Mobility    Modified Rankin (Stroke Patients Only)       Balance Overall balance assessment: Modified Independent                                  Cognition  Arousal/Alertness: Awake/alert Behavior During Therapy: WFL for tasks assessed/performed Overall Cognitive Status: Within Functional Limits for tasks assessed                      Exercises      General Comments General comments (skin integrity, edema, etc.): educated on car transfers, mobility expectations, stair negotation, and pain management strategies.      Pertinent Vitals/Pain Pain Assessment: 0-10 Pain Score: 8  Pain Location: back and right LE/hip Pain Descriptors / Indicators: Aching;Grimacing;Guarding;Operative site guarding Pain Intervention(s): Limited activity within patient's tolerance;Monitored during session;RN gave pain meds during session;Repositioned    Home Living                      Prior Function            PT Goals (current goals can now be found in the care plan section) Acute Rehab PT Goals Patient Stated Goal: less pain PT Goal Formulation: With patient Time For Goal Achievement: 09/08/15 Potential to Achieve Goals: Good    Frequency  Min 5X/week    PT Plan Current plan remains appropriate    Co-evaluation             End of Session Equipment Utilized During Treatment: Gait belt;Back brace Activity Tolerance: Patient limited by pain Patient left: in bed;with call bell/phone within reach;with family/visitor present (sitting EOB)     Time: [id number] PT Time Calculation (min) (ACUTE ONLY): 18 min  Charges:  $Gait Training: 8-22 mins                    G  Codes:      Duncan Dull 08/26/2015, 9:35 AM Alben Deeds, PT DPT  [phone]

## 2015-09-27 ENCOUNTER — Ambulatory Visit: Admission: RE | Admit: 2015-09-27 | Payer: BLUE CROSS/BLUE SHIELD | Source: Ambulatory Visit

## 2015-11-10 ENCOUNTER — Other Ambulatory Visit: Payer: Self-pay | Admitting: Neurology

## 2015-11-10 DIAGNOSIS — R519 Headache, unspecified: Secondary | ICD-10-CM

## 2015-11-10 DIAGNOSIS — R51 Headache: Principal | ICD-10-CM

## 2015-11-23 ENCOUNTER — Ambulatory Visit
Admission: RE | Admit: 2015-11-23 | Discharge: 2015-11-23 | Disposition: A | Discharge status: Home / Self Care | Payer: BLUE CROSS/BLUE SHIELD | Source: Ambulatory Visit | Attending: Neurology | Admitting: Neurology

## 2015-11-23 DIAGNOSIS — R51 Headache: Secondary | ICD-10-CM | POA: Insufficient documentation

## 2015-11-23 DIAGNOSIS — R519 Headache, unspecified: Secondary | ICD-10-CM

## 2015-11-23 LAB — POCT I-STAT CREATININE: CREATININE: 0.6 mg/dL (ref 0.44–1.00)

## 2015-11-23 MED ORDER — GADOBENATE DIMEGLUMINE 529 MG/ML IV SOLN
20.0000 mL | Freq: Once | INTRAVENOUS | Status: AC | PRN
  Administered 2015-11-23: 20 mL via INTRAVENOUS

## 2015-12-07 ENCOUNTER — Other Ambulatory Visit: Payer: Self-pay | Admitting: Neurosurgery

## 2015-12-07 DIAGNOSIS — M4714 Other spondylosis with myelopathy, thoracic region: Secondary | ICD-10-CM

## 2015-12-08 ENCOUNTER — Other Ambulatory Visit: Payer: Self-pay | Admitting: Neurosurgery

## 2015-12-08 DIAGNOSIS — M4714 Other spondylosis with myelopathy, thoracic region: Secondary | ICD-10-CM

## 2015-12-16 ENCOUNTER — Ambulatory Visit
Admission: RE | Admit: 2015-12-16 | Discharge: 2015-12-16 | Disposition: A | Discharge status: Home / Self Care | Payer: BLUE CROSS/BLUE SHIELD | Source: Ambulatory Visit | Attending: Neurosurgery | Admitting: Neurosurgery

## 2015-12-16 DIAGNOSIS — Z9889 Other specified postprocedural states: Secondary | ICD-10-CM | POA: Insufficient documentation

## 2015-12-16 DIAGNOSIS — M4714 Other spondylosis with myelopathy, thoracic region: Secondary | ICD-10-CM | POA: Insufficient documentation

## 2015-12-16 MED ORDER — GADOBENATE DIMEGLUMINE 529 MG/ML IV SOLN
20.0000 mL | Freq: Once | INTRAVENOUS | Status: AC | PRN
  Administered 2015-12-16: 20 mL via INTRAVENOUS

## 2016-01-30 ENCOUNTER — Ambulatory Visit
Admission: RE | Admit: 2016-01-30 | Discharge: 2016-01-30 | Disposition: A | Discharge status: Home / Self Care | Payer: BLUE CROSS/BLUE SHIELD | Source: Ambulatory Visit | Attending: Internal Medicine | Admitting: Internal Medicine

## 2016-01-30 ENCOUNTER — Other Ambulatory Visit: Payer: Self-pay | Admitting: Internal Medicine

## 2016-01-30 DIAGNOSIS — Z1231 Encounter for screening mammogram for malignant neoplasm of breast: Secondary | ICD-10-CM | POA: Diagnosis present

## 2016-02-21 LAB — HM PAP SMEAR: HM Pap smear: NEGATIVE

## 2016-12-26 ENCOUNTER — Encounter: Payer: Self-pay | Admitting: Intensive Care

## 2016-12-26 ENCOUNTER — Emergency Department
Admission: EM | Admit: 2016-12-26 | Discharge: 2016-12-26 | Disposition: A | Discharge status: Home / Self Care | Payer: No Typology Code available for payment source | Attending: Student in an Organized Health Care Education/Training Program | Admitting: Student in an Organized Health Care Education/Training Program

## 2016-12-26 ENCOUNTER — Emergency Department: Payer: No Typology Code available for payment source

## 2016-12-26 DIAGNOSIS — S29012A Strain of muscle and tendon of back wall of thorax, initial encounter: Secondary | ICD-10-CM | POA: Diagnosis not present

## 2016-12-26 DIAGNOSIS — Y92414 Local residential or business street as the place of occurrence of the external cause: Secondary | ICD-10-CM | POA: Diagnosis not present

## 2016-12-26 DIAGNOSIS — Y999 Unspecified external cause status: Secondary | ICD-10-CM | POA: Insufficient documentation

## 2016-12-26 DIAGNOSIS — Z79899 Other long term (current) drug therapy: Secondary | ICD-10-CM | POA: Diagnosis not present

## 2016-12-26 DIAGNOSIS — I10 Essential (primary) hypertension: Secondary | ICD-10-CM | POA: Insufficient documentation

## 2016-12-26 DIAGNOSIS — Y9389 Activity, other specified: Secondary | ICD-10-CM | POA: Insufficient documentation

## 2016-12-26 DIAGNOSIS — M546 Pain in thoracic spine: Secondary | ICD-10-CM

## 2016-12-26 DIAGNOSIS — S298XXA Other specified injuries of thorax, initial encounter: Secondary | ICD-10-CM | POA: Diagnosis present

## 2016-12-26 DIAGNOSIS — T148XXA Other injury of unspecified body region, initial encounter: Secondary | ICD-10-CM

## 2016-12-26 MED ORDER — TRAMADOL HCL 50 MG PO TABS
50.0000 mg | ORAL_TABLET | Freq: Once | ORAL | Status: AC
  Administered 2016-12-26: 50 mg via ORAL
  Filled 2016-12-26: qty 1

## 2016-12-26 MED ORDER — KETOROLAC TROMETHAMINE 30 MG/ML IJ SOLN
30.0000 mg | Freq: Once | INTRAMUSCULAR | Status: AC
  Administered 2016-12-26: 30 mg via INTRAMUSCULAR
  Filled 2016-12-26: qty 1

## 2016-12-26 MED ORDER — ORPHENADRINE CITRATE 30 MG/ML IJ SOLN
60.0000 mg | Freq: Once | INTRAMUSCULAR | Status: AC
  Administered 2016-12-26: 60 mg via INTRAMUSCULAR
  Filled 2016-12-26: qty 2

## 2016-12-26 MED ORDER — METHOCARBAMOL 500 MG PO TABS: 500.0000 mg | ORAL_TABLET | Freq: Four times a day (QID) | ORAL | 0 refills | Status: DC
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
## 2016-12-26 NOTE — ED Notes (Signed)
Pt in scans. Will administer pain medication when pt returns.
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
## 2017-04-09 IMAGING — CR DG THORACOLUMBAR SPINE 2V
4 series · 4 of 4 positions shown · non-contrast
Comparison: None.

CLINICAL DATA: Thoracic Laminectomy T9-10, T10-11,

EXAM:
THORACOLUMBAR SPINE 1V

[lat (1 of 2)]
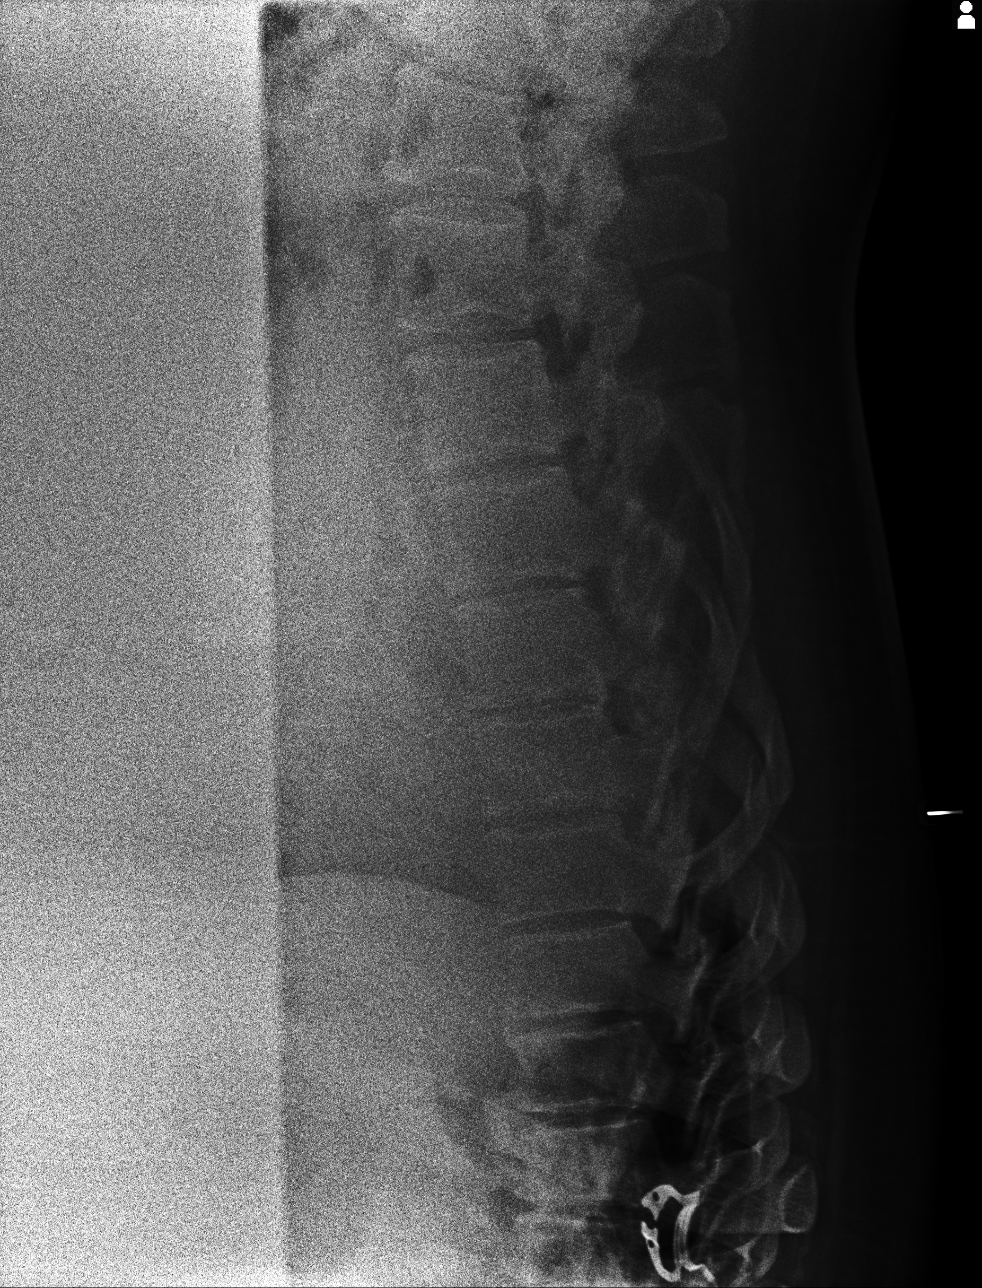

[lat (2 of 2)]
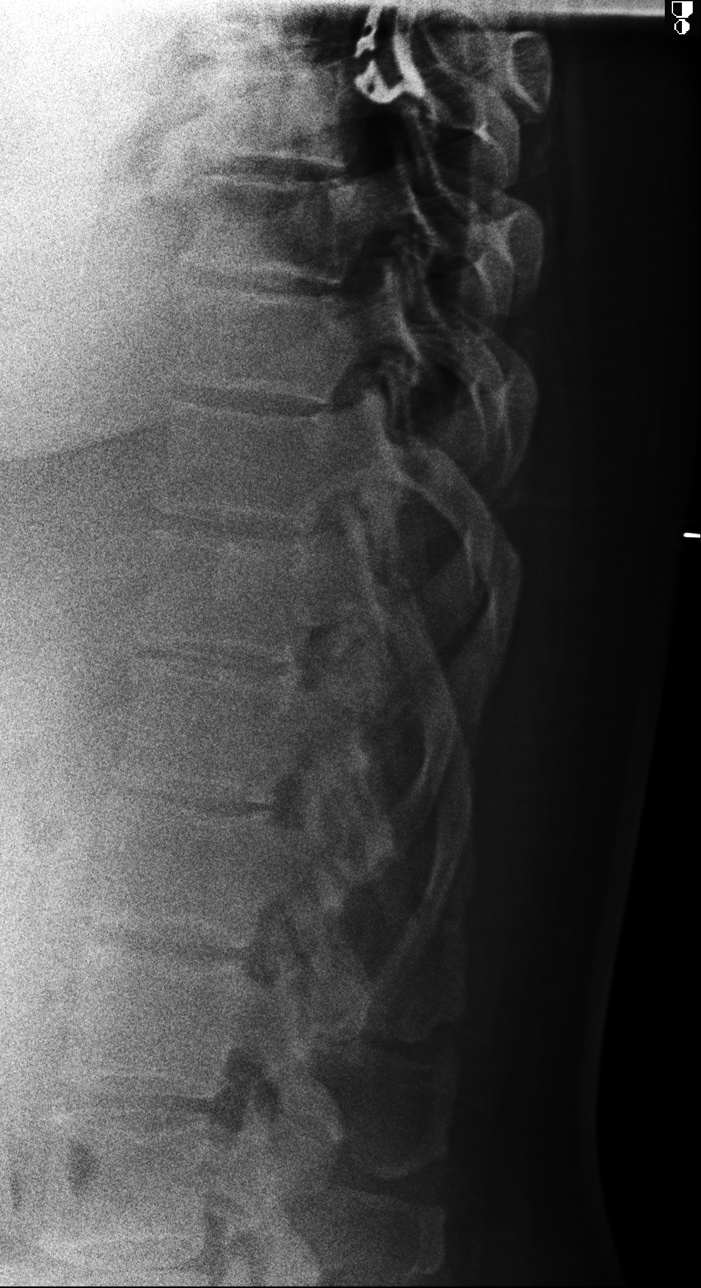

[ap (1 of 2)]
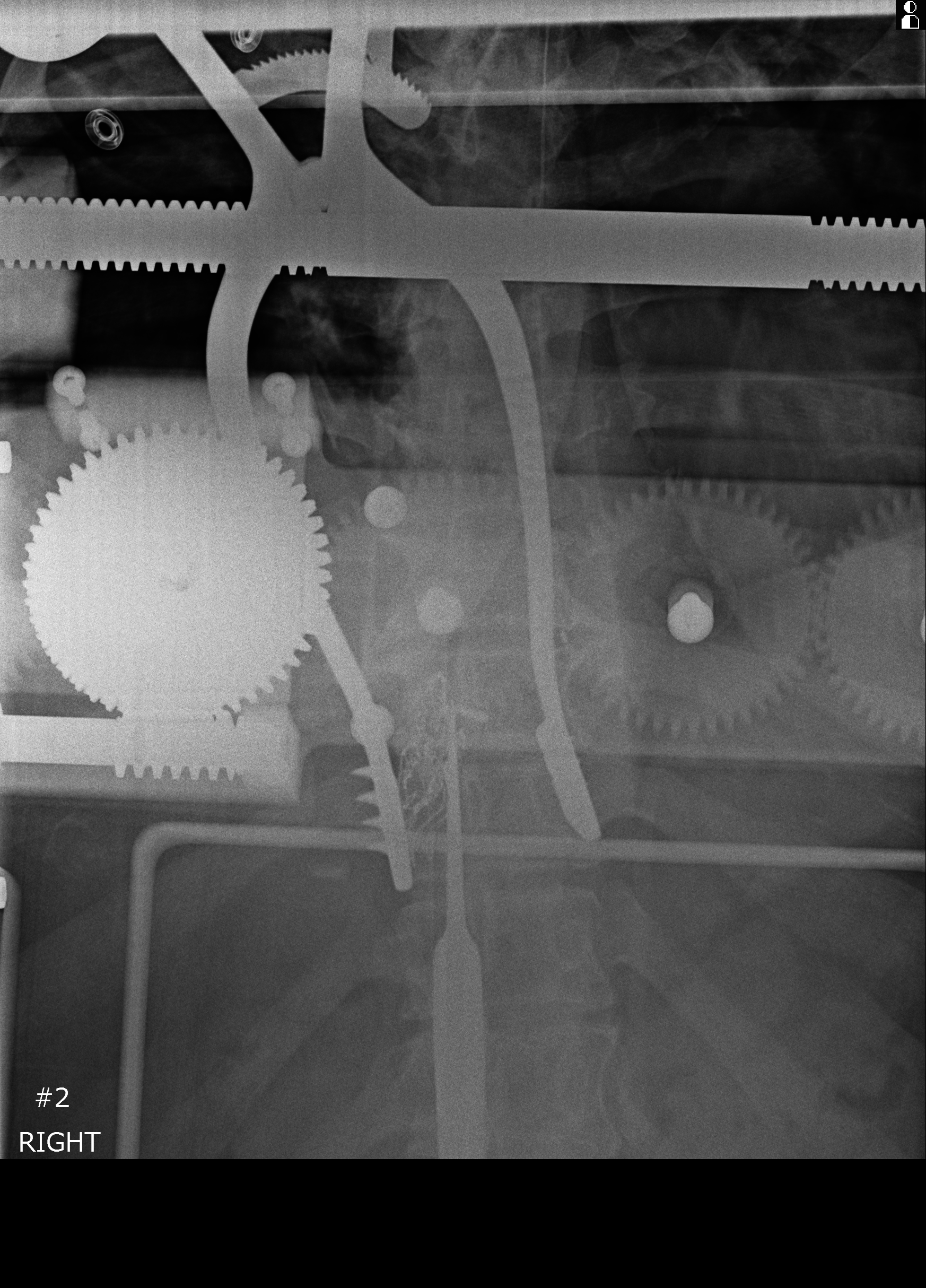

[ap (2 of 2)]
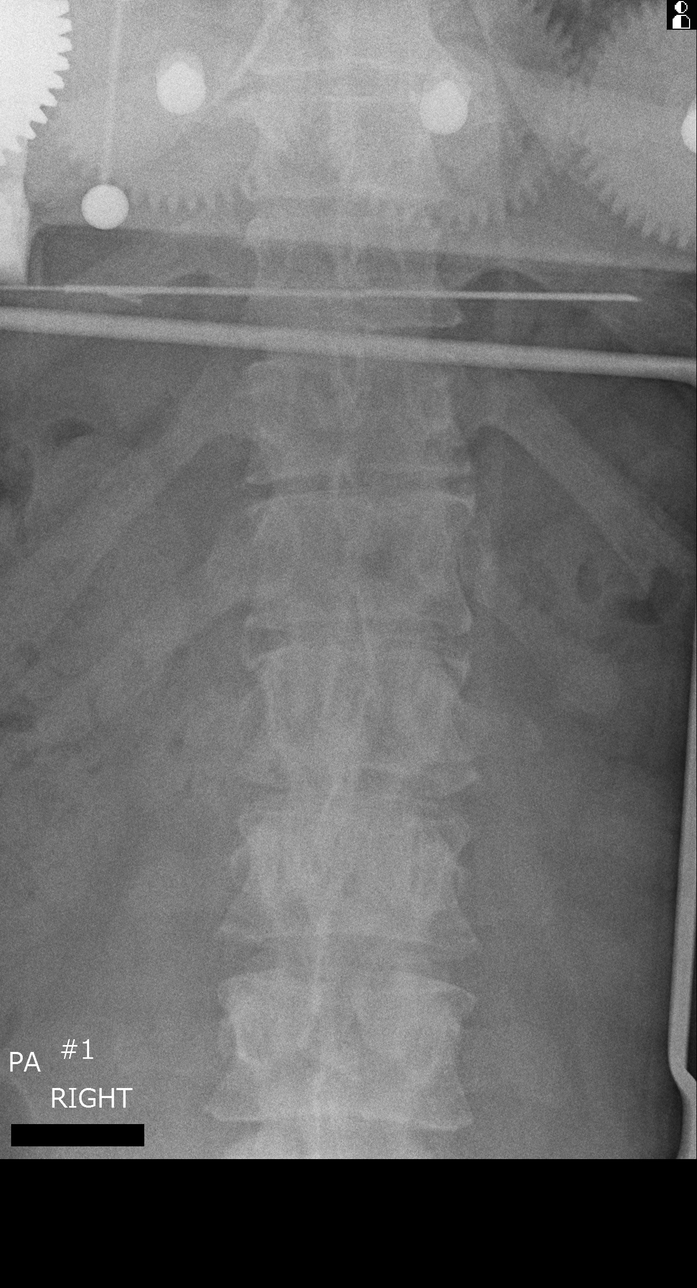

[4 of 4 positions shown; findings below may reference images not displayed]

FINDINGS: FILM #1: [DATE]: A surgical instrument is identified posterior to
the lower thoracic spine, posterior to the vertebral body of T10.

FILM #2: [DATE]: Surgical instrument is identified posterior to the
vertebral body at T10.

FILM #3: [DATE]: Surgical instruments overlying the disc space at
T10-11.

FILM #4: [DATE]: Pain posterior retractor is in place. A surgical
instrument overlies T9-10.
IMPRESSION: Intraoperative images during laminectomy at T9-10 and T11.

## 2017-12-10 ENCOUNTER — Other Ambulatory Visit: Payer: Self-pay | Admitting: Student

## 2017-12-10 DIAGNOSIS — M7581 Other shoulder lesions, right shoulder: Secondary | ICD-10-CM

## 2017-12-10 DIAGNOSIS — M7521 Bicipital tendinitis, right shoulder: Secondary | ICD-10-CM

## 2018-06-11 ENCOUNTER — Other Ambulatory Visit: Payer: Self-pay | Admitting: Internal Medicine

## 2018-06-11 DIAGNOSIS — Z1231 Encounter for screening mammogram for malignant neoplasm of breast: Secondary | ICD-10-CM

## 2018-08-06 ENCOUNTER — Other Ambulatory Visit: Payer: Self-pay | Admitting: *Deleted

## 2018-08-06 DIAGNOSIS — Z20822 Contact with and (suspected) exposure to covid-19: Secondary | ICD-10-CM

## 2018-08-11 LAB — NOVEL CORONAVIRUS, NAA: SARS-CoV-2, NAA: NOT DETECTED

## 2018-11-03 ENCOUNTER — Telehealth: Payer: Self-pay | Admitting: Internal Medicine

## 2018-12-15 ENCOUNTER — Other Ambulatory Visit: Payer: Self-pay

## 2018-12-15 ENCOUNTER — Ambulatory Visit (INDEPENDENT_AMBULATORY_CARE_PROVIDER_SITE_OTHER): Payer: BC Managed Care – PPO | Admitting: Physician Assistant

## 2018-12-15 ENCOUNTER — Encounter: Payer: Self-pay | Admitting: Physician Assistant

## 2018-12-15 VITALS — BP 142/80 | HR 72 | Temp 96.9°F | Ht 62.0 in | Wt 249.6 lb

## 2018-12-15 DIAGNOSIS — Z13220 Encounter for screening for lipoid disorders: Secondary | ICD-10-CM | POA: Diagnosis not present

## 2018-12-15 DIAGNOSIS — I10 Essential (primary) hypertension: Secondary | ICD-10-CM

## 2018-12-15 DIAGNOSIS — F32A Depression, unspecified: Secondary | ICD-10-CM

## 2018-12-15 DIAGNOSIS — F329 Major depressive disorder, single episode, unspecified: Secondary | ICD-10-CM

## 2018-12-15 DIAGNOSIS — M62838 Other muscle spasm: Secondary | ICD-10-CM

## 2018-12-15 DIAGNOSIS — M4714 Other spondylosis with myelopathy, thoracic region: Secondary | ICD-10-CM | POA: Diagnosis not present

## 2018-12-15 MED ORDER — SERTRALINE HCL 50 MG PO TABS: 50.0000 mg | ORAL_TABLET | Freq: Every day | ORAL | 1 refills | Status: DC

## 2018-12-15 MED ORDER — METHOCARBAMOL 500 MG PO TABS: 500.0000 mg | ORAL_TABLET | Freq: Four times a day (QID) | ORAL | 0 refills | Status: DC

## 2018-12-15 MED ORDER — MELOXICAM 15 MG PO TABS: 15.0000 mg | ORAL_TABLET | Freq: Every day | ORAL | 1 refills | Status: DC

## 2018-12-15 NOTE — Progress Notes (Signed)
Patient: Taylor Valencia, Female    DOB: 09/10/1970, 48 y.o.   MRN: [id number] Visit Date: 12/15/2018  Today's Provider: Trinna Post, PA-C   Chief Complaint  Patient presents with  . New Patient (Initial Visit)   Subjective:    New Patient Appointment Taylor Valencia is a 48 y.o. female who presents today for new patient appointment. She feels fairly well. She reports exercising none. She reports she is sleeping fairly well. Followed by [facility].   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 No children.   PAP smear 02/19/2016 normal and HPV negative.  HTN: Currently taking lisinopril-HCTZ sine aged 89.   Back Surgery: T9 and T10 laminectomy due to back pain. She had this several years. Afterwards, the pain did improve however she continued to have persistent pain in the thoracic region. She received tramadol 50 mg PRN from her previous PCP. Thoracic Spine Xray 11.29.18 shows s/p T9 and T10 laminectomy and diffuse degenerative endplate spurring.   Depression: Patient reports mood swings, irritability, sadness and crying. She reports feeling irritability at husband.   Depression screen PHQ 2/9 12/15/2018  Decreased Interest 1  Down, Depressed, Hopeless 1  PHQ - 2 Score 2  Altered sleeping 2  Tired, decreased energy 2  Change in appetite 3  Feeling bad or failure about yourself  2  Trouble concentrating 0  Moving slowly or fidgety/restless 0  Suicidal thoughts 0  PHQ-9 Score 11  Difficult doing work/chores Not difficult at all   Morbid Obesity: Patient reports eating poorly and large quantities. She reports recognizing that she is full and eating despite knowing this is bad for her. She has been on phentermine previously which made her feel agitated.   BMI Readings from Last 8 Encounters:  12/15/18 45.65 kg/m  12/26/16 45.35 kg/m  08/23/15 46.40 kg/m   Wt Readings from Last 3 Encounters:  12/15/18 249 lb 9.6 oz (113.2 kg)    12/26/16 240 lb (108.9 kg)  08/23/15 245 lb 9.5 oz (111.4 kg)    Tramadol 50 mg PRN -----------------------------------------------------------------   Review of Systems  Constitutional: Positive for activity change.  HENT: Positive for ear pain and tinnitus.   Eyes: Positive for photophobia.  Respiratory: Negative.   Endocrine: Negative.   Genitourinary: Negative.   Musculoskeletal: Positive for arthralgias (wrist per pt), back pain, myalgias and neck pain.  Skin: Positive for rash (simmertime per pt).  Allergic/Immunologic: Positive for environmental allergies.  Neurological: Negative.   Hematological: Negative.   Psychiatric/Behavioral: Positive for agitation.    Social History She  reports that she has never smoked. She has never used smokeless tobacco. She reports that she does not drink alcohol or use drugs. Social History   Socioeconomic History  . Marital status: Married    Spouse name: Not on file  . Number of children: Not on file  . Years of education: Not on file  . Highest education level: Not on file  Occupational History  . Not on file  Social Needs  . Financial resource strain: Not on file  . Food insecurity    Worry: Not on file    Inability: Not on file  . Transportation needs    Medical: Not on file    Non-medical: Not on file  Tobacco Use  . Smoking status: Never Smoker  . Smokeless tobacco: Never Used  Substance and Sexual Activity  . Alcohol use: No    Alcohol/week: 0.0 standard drinks  .  Drug use: No  . Sexual activity: Not on file  Lifestyle  . Physical activity    Days per week: Not on file    Minutes per session: Not on file  . Stress: Not on file  Relationships  . Social Herbalist on phone: Not on file    Gets together: Not on file    Attends religious service: Not on file    Active member of club or organization: Not on file    Attends meetings of clubs or organizations: Not on file    Relationship status: Not on  file  Other Topics Concern  . Not on file  Social History Narrative  . Not on file    Patient Active Problem List   Diagnosis Date Noted  . Thoracic myelopathy 08/24/2015    Past Surgical History:  Procedure Laterality Date  . BOWEL RESECTION     patient had some muscle removed   . CHOLECYSTECTOMY    . EYE SURGERY     lasix surgery  . LUMBAR LAMINECTOMY/DECOMPRESSION MICRODISCECTOMY N/A 08/24/2015   Procedure: Thoracic nine- ten, Thoracic ten- eleven Laminectomy;  Surgeon: Newman Pies, MD;  Location: [place] [facility];  Service: Neurosurgery;  Laterality: N/A;  T9-10, T10-11 Laminectomy  . TONSILLECTOMY      Family History  Family Status  Relation Name Status  . Neg Hx  (Not Specified)   Her family history is not on file.     Allergies  Allergen Reactions  . No Known Allergies     Previous Medications   BIOTIN W/ VITAMINS C & E (HAIR SKIN & NAILS GUMMIES) 1250-7.5-7.5 MCG-MG-UNT CHEW    Chew 2 tablets by mouth daily.   CARBOXYMETHYLCELLULOSE (REFRESH PLUS) 0.5 % SOLN    Place 1 drop into both eyes 3 (three) times daily as needed.   DIAZEPAM (VALIUM) 5 MG TABLET    Take 1 tablet (5 mg total) by mouth every 6 (six) hours as needed for muscle spasms.   GABAPENTIN (NEURONTIN) 300 MG CAPSULE    Take 1 capsule (300 mg total) by mouth 3 (three) times daily.   GYMNEMA SYLVESTRIS LEAF POWD    1 Dose by Does not apply route 2 (two) times daily.   HYDROCODONE-ACETAMINOPHEN (NORCO/VICODIN) 5-325 MG TABLET    Take 1-2 tablets by mouth every 4 (four) hours as needed for moderate pain.   LISINOPRIL-HYDROCHLOROTHIAZIDE (PRINZIDE,ZESTORETIC) 10-12.5 MG TABLET    Take 1 tablet by mouth daily.   MELOXICAM (MOBIC) 15 MG TABLET    TAKE ONE TABLET BY MOUTH ONCE DAILY   METHOCARBAMOL (ROBAXIN) 500 MG TABLET    Take 1 tablet (500 mg total) by mouth 4 (four) times daily.   TIZANIDINE (ZANAFLEX) 2 MG TABLET    Take 1 tablet (2 mg total) by mouth at bedtime.   TRAMADOL (ULTRAM) 50 MG TABLET    Take  50 mg by mouth every 6 (six) hours as needed for moderate pain.    Patient Care Team: Paulene Floor as PCP - General (Physician Assistant)      Objective:   Vitals: BP (!) 142/80 (BP Location: Left Arm, Patient Position: Sitting, Cuff Size: Large)   Pulse 72   Temp (!) 96.9 F (36.1 C) (Temporal)   Ht 5\' 2"  (1.575 m)   Wt 249 lb 9.6 oz (113.2 kg)   BMI 45.65 kg/m    Physical Exam Constitutional:      Appearance: Normal appearance. She is obese.  Cardiovascular:  Rate and Rhythm: Normal rate and regular rhythm.     Heart sounds: Normal heart sounds.  Pulmonary:     Effort: Pulmonary effort is normal.     Breath sounds: Normal breath sounds.  Skin:    General: Skin is warm and dry.  Neurological:     Mental Status: She is alert and oriented to person, place, and time. Mental status is at baseline.  Psychiatric:        Mood and Affect: Mood normal.        Behavior: Behavior normal.      Depression Screen PHQ 2/9 Scores 12/15/2018  PHQ - 2 Score 2  PHQ- 9 Score 11      Assessment & Plan:     Routine Health Maintenance and Physical Exam  Exercise Activities and Dietary recommendations Goals   None      There is no immunization history on file for this patient.  Health Maintenance  Topic Date Due  . HIV Screening  11/23/1984  . PAP SMEAR-Modifier  11/24/1990  . INFLUENZA VACCINE  08/29/2018  . TETANUS/TDAP  07/29/2022     Discussed health benefits of physical activity, and encouraged her to engage in regular exercise appropriate for her age and condition.    1. Thoracic myelopathy  Refer to pain management.   - meloxicam (MOBIC) 15 MG tablet; Take 1 tablet (15 mg total) by mouth daily.  Dispense: 30 tablet; Refill: 1 - Ambulatory referral to Pain Clinic  2. Essential hypertension  - Comprehensive Metabolic Panel (CMET) - Lipid Profile  3. Lipid screening  - Lipid Profile  4. Muscle spasm  - methocarbamol (ROBAXIN) 500 MG  tablet; Take 1 tablet (500 mg total) by mouth 4 (four) times daily.  Dispense: 30 tablet; Refill: 0  5. Depression, unspecified depression type  - sertraline (ZOLOFT) 50 MG tablet; Take 1 tablet (50 mg total) by mouth daily.  Dispense: 90 tablet; Refill: 1  6. Morbid obesity ([place])  Will address next visit after mood has been accounted for.   The entirety of the information documented in the History of Present Illness, Review of Systems and Physical Exam were personally obtained by me. Portions of this information were initially documented by [facility], CMA and reviewed by me for thoroughness and accuracy.   F/u 4-6 weeks for depression and weight.   --------------------------------------------------------------------

## 2018-12-16 LAB — COMPREHENSIVE METABOLIC PANEL
ALT: 21 IU/L (ref 0–32)
AST: 29 IU/L (ref 0–40)
Albumin/Globulin Ratio: 1.4 (ref 1.2–2.2)
Albumin: 4.6 g/dL (ref 3.8–4.8)
Alkaline Phosphatase: 110 IU/L (ref 39–117)
BUN/Creatinine Ratio: 26 — ABNORMAL HIGH (ref 9–23)
BUN: 18 mg/dL (ref 6–24)
Bilirubin Total: 0.3 mg/dL (ref 0.0–1.2)
CO2: 18 mmol/L — ABNORMAL LOW (ref 20–29)
Calcium: 9.9 mg/dL (ref 8.7–10.2)
Chloride: 102 mmol/L (ref 96–106)
Creatinine, Ser: 0.7 mg/dL (ref 0.57–1.00)
GFR calc Af Amer: 118 mL/min/{1.73_m2} (ref >59)
GFR calc non Af Amer: 102 mL/min/{1.73_m2} (ref >59)
Globulin, Total: 3.2 g/dL (ref 1.5–4.5)
Glucose: 90 mg/dL (ref 65–99)
Potassium: 4 mmol/L (ref 3.5–5.2)
Sodium: 139 mmol/L (ref 134–144)
Total Protein: 7.8 g/dL (ref 6.0–8.5)

## 2018-12-16 LAB — LIPID PANEL
Chol/HDL Ratio: 4.7 ratio — ABNORMAL HIGH (ref 0.0–4.4)
Cholesterol, Total: 165 mg/dL (ref 100–199)
HDL: 35 mg/dL — ABNORMAL LOW (ref >39)
LDL Chol Calc (NIH): 92 mg/dL (ref 0–99)
Triglycerides: 222 mg/dL — ABNORMAL HIGH (ref 0–149)
VLDL Cholesterol Cal: 38 mg/dL (ref 5–40)

## 2018-12-21 ENCOUNTER — Telehealth: Payer: Self-pay

## 2018-12-21 NOTE — Telephone Encounter (Signed)
-----   Message from Trinna Post, [place] sent at 12/21/2018  4:34 PM EST ----- Sugars look OK, no diabetes. Cholesterol overall looks good.

## 2018-12-21 NOTE — Telephone Encounter (Signed)
Patient advised of labs. She mentioned that you were going to order pain management referral. I didn't see any documentation about this during the office visit. Please advise.

## 2018-12-22 DIAGNOSIS — F32A Depression, unspecified: Secondary | ICD-10-CM | POA: Insufficient documentation

## 2018-12-22 DIAGNOSIS — I10 Essential (primary) hypertension: Secondary | ICD-10-CM | POA: Insufficient documentation

## 2018-12-22 DIAGNOSIS — F329 Major depressive disorder, single episode, unspecified: Secondary | ICD-10-CM | POA: Insufficient documentation

## 2018-12-22 NOTE — Telephone Encounter (Signed)
Referral placed.

## 2019-01-26 ENCOUNTER — Encounter: Payer: Self-pay | Admitting: Physician Assistant

## 2019-01-26 ENCOUNTER — Other Ambulatory Visit: Payer: Self-pay

## 2019-01-26 ENCOUNTER — Ambulatory Visit (INDEPENDENT_AMBULATORY_CARE_PROVIDER_SITE_OTHER): Payer: BC Managed Care – PPO | Admitting: Physician Assistant

## 2019-01-26 VITALS — BP 154/84 | HR 74 | Temp 96.6°F | Wt 254.2 lb

## 2019-01-26 DIAGNOSIS — F329 Major depressive disorder, single episode, unspecified: Secondary | ICD-10-CM

## 2019-01-26 DIAGNOSIS — I10 Essential (primary) hypertension: Secondary | ICD-10-CM | POA: Diagnosis not present

## 2019-01-26 DIAGNOSIS — F32A Depression, unspecified: Secondary | ICD-10-CM

## 2019-01-26 NOTE — Progress Notes (Signed)
Patient: Taylor Valencia Female    DOB: [birth year]   48 y.o.   MRN: [id number] Visit Date: 01/26/2019  Today's Provider: Trinna Post, PA-C   Chief Complaint  Patient presents with  . Depression  . Weight Loss   Subjective:     HPI  Depression Patient presents today for depression follow-up. Patient last office visit was on 12/15/2018. Patient is currently taking Sertraline 50 MG tablets daily. Patient report some nausea since starting the medication. Reports she is doing better with her mood. She has noticed some hair loss which was not present prior to taking zoloft. She prefers to continue the medication.    Depression screen [facility] 2/9 01/26/2019 12/15/2018  Decreased Interest 0 1  Down, Depressed, Hopeless 0 1  PHQ - 2 Score 0 2  Altered sleeping 1 2  Tired, decreased energy 2 2  Change in appetite 2 3  Feeling bad or failure about yourself  0 2  Trouble concentrating 0 0  Moving slowly or fidgety/restless 0 0  Suicidal thoughts 0 0  PHQ-9 Score 5 11  Difficult doing work/chores Not difficult at all Not difficult at all    Weight Loss Patient presents today for weight loss follow-up. Patient is not taking anything at the moment.  Wt Readings from Last 3 Encounters:  01/26/19 254 lb 3.2 oz (115.3 kg)  12/15/18 249 lb 9.6 oz (113.2 kg)  12/26/16 240 lb (108.9 kg)   HTN: Elevated today, forgot BP medication yesterday but did take it today. She is not checking her BP today. She is currently taking Lisinopril -HCTZ 10-12.5 mg daily without issue.   BP Readings from Last 3 Encounters:  01/26/19 (!) 154/84  12/15/18 (!) 142/80  12/26/16 136/72   Morbid Obesity: Reports she and her husband are both going to work on eating habits.   Allergies  Allergen Reactions  . No Known Allergies      Current Outpatient Medications:  .  Biotin w/ Vitamins C & E (HAIR SKIN & NAILS GUMMIES) 1250-7.5-7.5 MCG-MG-UNT CHEW, Chew 2 tablets by mouth daily., Disp:  , Rfl:  .  carboxymethylcellulose (REFRESH PLUS) 0.5 % SOLN, Place 1 drop into both eyes 3 (three) times daily as needed., Disp: , Rfl:  .  Gymnema Sylvestris Leaf POWD, 1 Dose by Does not apply route 2 (two) times daily., Disp: , Rfl:  .  lisinopril-hydrochlorothiazide (PRINZIDE,ZESTORETIC) 10-12.5 MG tablet, Take 1 tablet by mouth daily., Disp: , Rfl:  .  meloxicam (MOBIC) 15 MG tablet, Take 1 tablet (15 mg total) by mouth daily., Disp: 30 tablet, Rfl: 1 .  methocarbamol (ROBAXIN) 500 MG tablet, Take 1 tablet (500 mg total) by mouth 4 (four) times daily., Disp: 30 tablet, Rfl: 0 .  sertraline (ZOLOFT) 50 MG tablet, Take 1 tablet (50 mg total) by mouth daily., Disp: 90 tablet, Rfl: 1 .  traMADol (ULTRAM) 50 MG tablet, Take 50 mg by mouth every 6 (six) hours as needed for moderate pain., Disp: , Rfl:   Review of Systems  Constitutional: Negative.   Respiratory: Negative.   Gastrointestinal: Positive for nausea.  Genitourinary: Negative.   Neurological: Negative.     Social History   Tobacco Use  . Smoking status: Never Smoker  . Smokeless tobacco: Never Used  Substance Use Topics  . Alcohol use: No    Alcohol/week: 0.0 standard drinks      Objective:   BP (!) 154/84 (BP Location: Left Arm, Patient Position:  Sitting, Cuff Size: Large)   Pulse 74   Temp (!) 96.6 F (35.9 C) (Temporal)   Wt 254 lb 3.2 oz (115.3 kg)   BMI 46.49 kg/m  Vitals:   01/26/19 1428  BP: (!) 154/84  Pulse: 74  Temp: (!) 96.6 F (35.9 C)  TempSrc: Temporal  Weight: 254 lb 3.2 oz (115.3 kg)  Body mass index is 46.49 kg/m.   Physical Exam Constitutional:      Appearance: Normal appearance. She is obese.  Cardiovascular:     Rate and Rhythm: Normal rate and regular rhythm.     Heart sounds: Normal heart sounds.  Pulmonary:     Effort: Pulmonary effort is normal.     Breath sounds: Normal breath sounds.  Skin:    General: Skin is warm and dry.  Neurological:     Mental Status: She is alert  and oriented to person, place, and time. Mental status is at baseline.  Psychiatric:        Mood and Affect: Mood normal.        Behavior: Behavior normal.      No results found for any visits on 01/26/19.     Assessment & Plan    1. Depression, unspecified depression type  Improved, continue zoloft 50 mg. May consider changing if she has worsening side effects.  2. Morbid obesity [facility])  Patient feels like she is not eating as much and plans on making lifestyle changes.   3. Essential hypertension  Slightly elevated, forgot medicine. Recommend she check BP at home and return in 6 months for recheck. Earlier if BP consistently higher.   The entirety of the information documented in the History of Present Illness, Review of Systems and Physical Exam were personally obtained by me. Portions of this information were initially documented by [facility] and reviewed by me for thoroughness and accuracy.        Trinna Post, PA-C  [facility]

## 2019-02-08 ENCOUNTER — Other Ambulatory Visit: Payer: Self-pay | Admitting: Physician Assistant

## 2019-02-08 DIAGNOSIS — M4714 Other spondylosis with myelopathy, thoracic region: Secondary | ICD-10-CM

## 2019-06-04 ENCOUNTER — Other Ambulatory Visit: Payer: Self-pay | Admitting: Physician Assistant

## 2019-06-04 DIAGNOSIS — M4714 Other spondylosis with myelopathy, thoracic region: Secondary | ICD-10-CM

## 2019-06-07 ENCOUNTER — Other Ambulatory Visit: Payer: Self-pay | Admitting: Physician Assistant

## 2019-06-07 DIAGNOSIS — F32A Depression, unspecified: Secondary | ICD-10-CM

## 2019-06-07 DIAGNOSIS — F329 Major depressive disorder, single episode, unspecified: Secondary | ICD-10-CM

## 2019-07-27 ENCOUNTER — Ambulatory Visit: Payer: Self-pay | Admitting: Physician Assistant

## 2019-07-27 ENCOUNTER — Other Ambulatory Visit: Payer: Self-pay

## 2019-07-27 ENCOUNTER — Encounter: Payer: Self-pay | Admitting: Physician Assistant

## 2019-07-27 VITALS — BP 123/73 | HR 80 | Temp 97.1°F | Resp 20 | Ht 60.0 in | Wt 255.0 lb

## 2019-07-27 DIAGNOSIS — B372 Candidiasis of skin and nail: Secondary | ICD-10-CM

## 2019-07-27 DIAGNOSIS — F32A Depression, unspecified: Secondary | ICD-10-CM

## 2019-07-27 DIAGNOSIS — F329 Major depressive disorder, single episode, unspecified: Secondary | ICD-10-CM

## 2019-07-27 DIAGNOSIS — I10 Essential (primary) hypertension: Secondary | ICD-10-CM

## 2019-07-27 MED ORDER — LISINOPRIL-HYDROCHLOROTHIAZIDE 10-12.5 MG PO TABS: 1.0000 | ORAL_TABLET | Freq: Every day | ORAL | 3 refills | Status: DC

## 2019-07-27 MED ORDER — SERTRALINE HCL 50 MG PO TABS: 50.0000 mg | ORAL_TABLET | Freq: Every day | ORAL | 3 refills | Status: DC

## 2019-07-27 MED ORDER — CLOTRIMAZOLE 1 % EX CREA: 1.0000 "application " | TOPICAL_CREAM | Freq: Two times a day (BID) | CUTANEOUS | 0 refills | Status: DC

## 2019-07-27 NOTE — Progress Notes (Signed)
Established patient visit   Patient: Taylor Valencia   DOB: 1970/11/16   49 y.o. Female  MRN: [id number] Visit Date: 07/27/2019  Today's healthcare provider: Trinna Post, PA-C   Chief Complaint  Patient presents with  . Hypertension   Subjective    HPI Hypertension, follow-up  BP Readings from Last 3 Encounters:  07/27/19 123/73  01/26/19 (!) 154/84  12/15/18 (!) 142/80   Wt Readings from Last 3 Encounters:  07/27/19 255 lb (115.7 kg)  01/26/19 254 lb 3.2 oz (115.3 kg)  12/15/18 249 lb 9.6 oz (113.2 kg)     She was last seen for hypertension 6 months ago.  BP at that visit was 154/84. Management since that visit includes no changes.  She reports good compliance with treatment. She is not having side effects.  She is following a Regular diet. She is not exercising. She does not smoke.  Use of agents associated with hypertension: none.   Outside blood pressures are not being checked. Symptoms: No chest pain No chest pressure  No palpitations No syncope  No dyspnea No orthopnea  No paroxysmal nocturnal dyspnea No lower extremity edema   Pertinent labs: Lab Results  Component Value Date   CHOL 165 12/15/2018   HDL 35 (L) 12/15/2018   LDLCALC 92 12/15/2018   TRIG 222 (H) 12/15/2018   CHOLHDL 4.7 (H) 12/15/2018   Lab Results  Component Value Date   NA 139 12/15/2018   K 4.0 12/15/2018   CREATININE 0.70 12/15/2018   GFRNONAA 102 12/15/2018   GFRAA 118 12/15/2018   GLUCOSE 90 12/15/2018     The 10-year ASCVD risk score Mikey Bussing DC Jr., et al., 2013) is: 1.9%   Depression, Follow-up  She  was last seen for this 6 months ago. Changes made at last visit include no changes. Continue Zoloft.    She reports good compliance with treatment. She is not having side effects.   She reports good tolerance of treatment. Current symptoms include: weight gain She feels she is Unchanged since last visit.  Depression screen [facility] 2/9 01/26/2019  12/15/2018  Decreased Interest 0 1  Down, Depressed, Hopeless 0 1  PHQ - 2 Score 0 2  Altered sleeping 1 2  Tired, decreased energy 2 2  Change in appetite 2 3  Feeling bad or failure about yourself  0 2  Trouble concentrating 0 0  Moving slowly or fidgety/restless 0 0  Suicidal thoughts 0 0  PHQ-9 Score 5 11  Difficult doing work/chores Not difficult at all Not difficult at all   Obesity: Patient reports that she has tried to improve her diet but she has not lost weight. She is interested in weight loss medicines. She currently does not have insurance.   Rash: Patient reports that she gets a red itchy rash in the summer time.      Medications: Outpatient Medications Prior to Visit  Medication Sig  . lisinopril-hydrochlorothiazide (PRINZIDE,ZESTORETIC) 10-12.5 MG tablet Take 1 tablet by mouth daily.  . meloxicam (MOBIC) 15 MG tablet TAKE 1 TABLET(15 MG) BY MOUTH DAILY  . sertraline (ZOLOFT) 50 MG tablet TAKE 1 TABLET(50 MG) BY MOUTH DAILY  . Biotin w/ Vitamins C & E (HAIR SKIN & NAILS GUMMIES) 1250-7.5-7.5 MCG-MG-UNT CHEW Chew 2 tablets by mouth daily. (Patient not taking: Reported on 07/27/2019)  . carboxymethylcellulose (REFRESH PLUS) 0.5 % SOLN Place 1 drop into both eyes 3 (three) times daily as needed. (Patient not taking: Reported on 07/27/2019)  .  Gymnema Sylvestris Leaf POWD 1 Dose by Does not apply route 2 (two) times daily. (Patient not taking: Reported on 07/27/2019)  . methocarbamol (ROBAXIN) 500 MG tablet Take 1 tablet (500 mg total) by mouth 4 (four) times daily. (Patient not taking: Reported on 07/27/2019)  . traMADol (ULTRAM) 50 MG tablet Take 50 mg by mouth every 6 (six) hours as needed for moderate pain. (Patient not taking: Reported on 07/27/2019)   No facility-administered medications prior to visit.    Review of Systems  Constitutional: Negative.   Respiratory: Negative.   Cardiovascular: Negative.   Musculoskeletal: Negative.   Neurological: Negative.        Objective    BP 123/73 (BP Location: Right Arm)   Pulse 80   Temp (!) 97.1 F (36.2 C)   Resp 20   Ht 5' (1.524 m)   Wt 255 lb (115.7 kg)   BMI 49.80 kg/m    Physical Exam Constitutional:      Appearance: She is obese.  Cardiovascular:     Rate and Rhythm: Normal rate and regular rhythm.     Pulses: Normal pulses.     Heart sounds: Normal heart sounds.  Pulmonary:     Effort: Pulmonary effort is normal.     Breath sounds: Normal breath sounds.  Skin:    General: Skin is warm and dry.  Neurological:     Mental Status: She is alert and oriented to person, place, and time. Mental status is at baseline.  Psychiatric:        Mood and Affect: Mood normal.        Behavior: Behavior normal.      No results found for any visits on 07/27/19.  Assessment & Plan    1. Essential hypertension Well controlled Continue current medications. F/u in 12 months when she has insurance.  - lisinopril-hydrochlorothiazide (ZESTORETIC) 10-12.5 MG tablet; Take 1 tablet by mouth daily.  Dispense: 90 tablet; Refill: 3  2. Depression, unspecified depression type Stable.  - sertraline (ZOLOFT) 50 MG tablet; Take 1 tablet (50 mg total) by mouth daily.  Dispense: 90 tablet; Refill: 3  3. Yeast dermatitis  - clotrimazole (LOTRIMIN) 1 % cream; Apply 1 application topically 2 (two) times daily.  Dispense: 30 g; Refill: 0  4. Morbid obesity ([place]) Defer weight loss medications until she has insurance.     ITrinna Post, PA-C, have reviewed all documentation for this visit. The documentation on 08/04/19 for the exam, diagnosis, procedures, and orders are all accurate and complete.     Paulene Floor  [facility] [phone] (phone) [phone] (fax)  [place]

## 2019-09-04 ENCOUNTER — Other Ambulatory Visit: Payer: Self-pay | Admitting: Physician Assistant

## 2019-09-04 DIAGNOSIS — M4714 Other spondylosis with myelopathy, thoracic region: Secondary | ICD-10-CM

## 2019-09-11 ENCOUNTER — Other Ambulatory Visit: Payer: Self-pay | Admitting: Physician Assistant

## 2019-09-11 DIAGNOSIS — M4714 Other spondylosis with myelopathy, thoracic region: Secondary | ICD-10-CM

## 2020-03-08 ENCOUNTER — Other Ambulatory Visit: Payer: Self-pay | Admitting: Physical Medicine & Rehabilitation

## 2020-03-08 ENCOUNTER — Other Ambulatory Visit (HOSPITAL_COMMUNITY): Payer: Self-pay | Admitting: Physical Medicine & Rehabilitation

## 2020-03-08 DIAGNOSIS — M5416 Radiculopathy, lumbar region: Secondary | ICD-10-CM

## 2020-03-20 ENCOUNTER — Other Ambulatory Visit: Payer: Self-pay

## 2020-03-20 ENCOUNTER — Ambulatory Visit
Admission: RE | Admit: 2020-03-20 | Discharge: 2020-03-20 | Disposition: A | Discharge status: Home / Self Care | Payer: 59 | Source: Ambulatory Visit | Attending: Physical Medicine & Rehabilitation | Admitting: Physical Medicine & Rehabilitation

## 2020-03-20 DIAGNOSIS — M5416 Radiculopathy, lumbar region: Secondary | ICD-10-CM | POA: Insufficient documentation

## 2020-04-04 ENCOUNTER — Other Ambulatory Visit: Payer: Self-pay | Admitting: Physical Medicine & Rehabilitation

## 2020-04-04 DIAGNOSIS — M5414 Radiculopathy, thoracic region: Secondary | ICD-10-CM

## 2020-04-04 DIAGNOSIS — M546 Pain in thoracic spine: Secondary | ICD-10-CM

## 2020-04-04 DIAGNOSIS — G8929 Other chronic pain: Secondary | ICD-10-CM

## 2020-04-06 ENCOUNTER — Other Ambulatory Visit: Payer: Self-pay | Admitting: Internal Medicine

## 2020-04-06 DIAGNOSIS — Z1231 Encounter for screening mammogram for malignant neoplasm of breast: Secondary | ICD-10-CM

## 2020-04-18 ENCOUNTER — Other Ambulatory Visit: Payer: Self-pay

## 2020-04-18 ENCOUNTER — Ambulatory Visit
Admission: RE | Admit: 2020-04-18 | Discharge: 2020-04-18 | Disposition: A | Discharge status: Home / Self Care | Payer: 59 | Source: Ambulatory Visit | Attending: Physical Medicine & Rehabilitation | Admitting: Physical Medicine & Rehabilitation

## 2020-04-18 DIAGNOSIS — M5414 Radiculopathy, thoracic region: Secondary | ICD-10-CM | POA: Diagnosis present

## 2020-04-18 DIAGNOSIS — M546 Pain in thoracic spine: Secondary | ICD-10-CM | POA: Diagnosis not present

## 2020-04-18 DIAGNOSIS — G8929 Other chronic pain: Secondary | ICD-10-CM | POA: Insufficient documentation

## 2020-04-25 ENCOUNTER — Other Ambulatory Visit: Payer: Self-pay | Admitting: Internal Medicine

## 2020-06-02 ENCOUNTER — Other Ambulatory Visit: Payer: Self-pay | Admitting: Physical Medicine & Rehabilitation

## 2020-06-02 ENCOUNTER — Other Ambulatory Visit (HOSPITAL_COMMUNITY): Payer: Self-pay | Admitting: Physical Medicine & Rehabilitation

## 2020-06-02 DIAGNOSIS — M542 Cervicalgia: Secondary | ICD-10-CM

## 2020-06-07 ENCOUNTER — Ambulatory Visit: Payer: 59

## 2020-06-10 ENCOUNTER — Ambulatory Visit: Payer: 59

## 2020-07-03 ENCOUNTER — Other Ambulatory Visit: Payer: Self-pay

## 2020-07-03 ENCOUNTER — Ambulatory Visit
Admission: RE | Admit: 2020-07-03 | Discharge: 2020-07-03 | Disposition: A | Discharge status: Home / Self Care | Payer: 59 | Source: Ambulatory Visit | Attending: Physical Medicine & Rehabilitation | Admitting: Physical Medicine & Rehabilitation

## 2020-07-03 DIAGNOSIS — M542 Cervicalgia: Secondary | ICD-10-CM | POA: Diagnosis not present

## 2020-07-27 ENCOUNTER — Ambulatory Visit: Payer: Self-pay | Admitting: Physician Assistant

## 2020-11-15 ENCOUNTER — Other Ambulatory Visit: Payer: Self-pay

## 2020-11-15 ENCOUNTER — Ambulatory Visit
Admission: RE | Admit: 2020-11-15 | Discharge: 2020-11-15 | Disposition: A | Discharge status: Home / Self Care | Payer: 59 | Source: Ambulatory Visit | Attending: Internal Medicine | Admitting: Internal Medicine

## 2020-11-15 DIAGNOSIS — Z1231 Encounter for screening mammogram for malignant neoplasm of breast: Secondary | ICD-10-CM | POA: Diagnosis present

## 2022-03-11 ENCOUNTER — Encounter: Payer: Self-pay | Admitting: *Deleted

## 2022-03-11 ENCOUNTER — Ambulatory Visit
Admission: RE | Admit: 2022-03-11 | Discharge: 2022-03-11 | Disposition: A | Discharge status: Home / Self Care | Payer: 59 | Source: Ambulatory Visit | Attending: Gastroenterology | Admitting: Gastroenterology

## 2022-03-11 ENCOUNTER — Ambulatory Visit: Payer: 59 | Admitting: Anesthesiology

## 2022-03-11 ENCOUNTER — Encounter
Admission: RE | Disposition: A | Discharge status: Home / Self Care | Payer: Self-pay | Source: Ambulatory Visit | Attending: Gastroenterology

## 2022-03-11 DIAGNOSIS — K640 First degree hemorrhoids: Secondary | ICD-10-CM | POA: Insufficient documentation

## 2022-03-11 DIAGNOSIS — Z6841 Body Mass Index (BMI) 40.0 and over, adult: Secondary | ICD-10-CM | POA: Diagnosis not present

## 2022-03-11 DIAGNOSIS — M549 Dorsalgia, unspecified: Secondary | ICD-10-CM | POA: Diagnosis not present

## 2022-03-11 DIAGNOSIS — G8929 Other chronic pain: Secondary | ICD-10-CM | POA: Insufficient documentation

## 2022-03-11 DIAGNOSIS — Z1211 Encounter for screening for malignant neoplasm of colon: Secondary | ICD-10-CM | POA: Insufficient documentation

## 2022-03-11 DIAGNOSIS — Z79899 Other long term (current) drug therapy: Secondary | ICD-10-CM | POA: Diagnosis not present

## 2022-03-11 DIAGNOSIS — M1990 Unspecified osteoarthritis, unspecified site: Secondary | ICD-10-CM | POA: Diagnosis not present

## 2022-03-11 DIAGNOSIS — I10 Essential (primary) hypertension: Secondary | ICD-10-CM | POA: Diagnosis not present

## 2022-03-11 DIAGNOSIS — D128 Benign neoplasm of rectum: Secondary | ICD-10-CM | POA: Diagnosis not present

## 2022-03-11 HISTORY — PX: COLONOSCOPY WITH PROPOFOL: SHX5780

## 2022-03-11 SURGERY — COLONOSCOPY WITH PROPOFOL
Anesthesia: General

## 2022-03-11 MED ORDER — PROPOFOL 1000 MG/100ML IV EMUL
INTRAVENOUS | Status: AC
  Filled 2022-03-11: qty 100

## 2022-03-11 MED ORDER — PROPOFOL 500 MG/50ML IV EMUL
INTRAVENOUS | Status: DC | PRN
  Administered 2022-03-11: 125 ug/kg/min via INTRAVENOUS

## 2022-03-11 MED ORDER — LIDOCAINE HCL (CARDIAC) PF 100 MG/5ML IV SOSY
PREFILLED_SYRINGE | INTRAVENOUS | Status: DC | PRN
  Administered 2022-03-11: 20 mg via INTRAVENOUS

## 2022-03-11 MED ORDER — PROPOFOL 10 MG/ML IV BOLUS
INTRAVENOUS | Status: DC | PRN
  Administered 2022-03-11: 90 mg via INTRAVENOUS

## 2022-03-11 MED ORDER — LIDOCAINE HCL (PF) 2 % IJ SOLN
INTRAMUSCULAR | Status: AC
  Filled 2022-03-11: qty 5

## 2022-03-11 MED ORDER — SODIUM CHLORIDE 0.9 % IV SOLN: INTRAVENOUS | Status: DC

## 2022-03-11 NOTE — Anesthesia Preprocedure Evaluation (Addendum)
Anesthesia Evaluation  Patient identified by MRN, date of birth, ID band Patient awake    Reviewed: Allergy & Precautions, H&P , NPO status , Patient's Chart, lab work & pertinent test results  Airway Mallampati: II  TM Distance: >3 FB Neck ROM: full    Dental no notable dental hx.    Pulmonary neg pulmonary ROS   Pulmonary exam normal        Cardiovascular hypertension, Pt. on medications Normal cardiovascular exam     Neuro/Psych  PSYCHIATRIC DISORDERS  Depression       GI/Hepatic negative GI ROS, Neg liver ROS,,,  Endo/Other    Morbid obesity  Renal/GU negative Renal ROS  negative genitourinary   Musculoskeletal  (+) Arthritis ,    Abdominal  (+) + obese  Peds  Hematology negative hematology ROS (+)   Anesthesia Other Findings Past Medical History: No date: Arthritis No date: Chronic back pain No date: History of bronchitis No date: Hypertension  Past Surgical History: No date: BOWEL RESECTION     Comment:  patient had some muscle removed  No date: CHOLECYSTECTOMY No date: EYE SURGERY     Comment:  lasix surgery 08/24/2015: LUMBAR LAMINECTOMY/DECOMPRESSION MICRODISCECTOMY; N/A     Comment:  Procedure: Thoracic nine- ten, Thoracic ten- eleven               Laminectomy;  Surgeon: Newman Pies, MD;  Location: [place]              [facility];  Service: Neurosurgery;  Laterality: N/A;                T9-10, T10-11 Laminectomy No date: TONSILLECTOMY  BMI    Body Mass Index: 49.29 kg/m      Reproductive/Obstetrics negative OB ROS                             Anesthesia Physical Anesthesia Plan  ASA: 3  Anesthesia Plan: General   Post-op Pain Management:    Induction: Intravenous  PONV Risk Score and Plan: Propofol infusion and TIVA  Airway Management Planned: Natural Airway  Additional Equipment:   Intra-op Plan:   Post-operative Plan:   Informed Consent: I have  reviewed the patients History and Physical, chart, labs and discussed the procedure including the risks, benefits and alternatives for the proposed anesthesia with the patient or authorized representative who has indicated his/her understanding and acceptance.     Dental Advisory Given  Plan Discussed with: CRNA and Surgeon  Anesthesia Plan Comments:         Anesthesia Quick Evaluation

## 2022-03-11 NOTE — H&P (Signed)
Outpatient short stay form Pre-procedure 03/11/2022  Lesly Rubenstein, MD  Primary Physician: Tracie Harrier, MD  Reason for visit:  Screening  History of present illness:    52 y/o lady with morbid obesity, hypertension, and arthritis here for index screening colonoscopy. No blood thinners. No family history of GI malignancies. History of cholecystectomy per patient but she isn't sure.    Current Facility-Administered Medications:    0.9 %  sodium chloride infusion, , Intravenous, Continuous, Arsema Tusing, Hilton Cork, MD, Last Rate: 20 mL/hr at 03/11/22 1211, New Bag at 03/11/22 1211  Medications Prior to Admission  Medication Sig Dispense Refill Last Dose   lisinopril-hydrochlorothiazide (ZESTORETIC) 10-12.5 MG tablet Take 1 tablet by mouth daily. 90 tablet 3 03/11/2022   sertraline (ZOLOFT) 50 MG tablet Take 1 tablet (50 mg total) by mouth daily. 90 tablet 3 Past Week   Biotin w/ Vitamins C & E (HAIR SKIN & NAILS GUMMIES) 1250-7.5-7.5 MCG-MG-UNT CHEW Chew 2 tablets by mouth daily. (Patient not taking: Reported on 07/27/2019)      carboxymethylcellulose (REFRESH PLUS) 0.5 % SOLN Place 1 drop into both eyes 3 (three) times daily as needed. (Patient not taking: Reported on 07/27/2019)      clotrimazole (LOTRIMIN) 1 % cream Apply 1 application topically 2 (two) times daily. 30 g 0    Gymnema Sylvestris Leaf POWD 1 Dose by Does not apply route 2 (two) times daily. (Patient not taking: Reported on 07/27/2019)      meloxicam (MOBIC) 15 MG tablet TAKE 1 TABLET(15 MG) BY MOUTH DAILY 90 tablet 0 03/08/2022   methocarbamol (ROBAXIN) 500 MG tablet Take 1 tablet (500 mg total) by mouth 4 (four) times daily. (Patient not taking: Reported on 07/27/2019) 30 tablet 0    traMADol (ULTRAM) 50 MG tablet Take 50 mg by mouth every 6 (six) hours as needed for moderate pain. (Patient not taking: Reported on 07/27/2019)        Allergies  Allergen Reactions   No Known Allergies      Past Medical History:   Diagnosis Date   Arthritis    Chronic back pain    History of bronchitis    Hypertension     Review of systems:  Otherwise negative.    Physical Exam  Gen: Alert, oriented. Appears stated age.  HEENT: PERRLA. Lungs: No respiratory distress CV: RRR Abd: soft, benign, no masses Ext: No edema    Planned procedures: Proceed with colonoscopy. The patient understands the nature of the planned procedure, indications, risks, alternatives and potential complications including but not limited to bleeding, infection, perforation, damage to internal organs and possible oversedation/side effects from anesthesia. The patient agrees and gives consent to proceed.  Please refer to procedure notes for findings, recommendations and patient disposition/instructions.     Lesly Rubenstein, MD [facility] G[facility]enter[facility]

## 2022-03-11 NOTE — Transfer of Care (Signed)
Immediate Anesthesia Transfer of Care Note  Patient: Elly Salido  Procedure(s) Performed: COLONOSCOPY WITH PROPOFOL  Patient Location: PACU  Anesthesia Type:General  Level of Consciousness: drowsy  Airway & Oxygen Therapy: Patient Spontanous Breathing  Post-op Assessment: Report given to RN and Post -op Vital signs reviewed and stable  Post vital signs: Reviewed and stable  Last Vitals:  Vitals Value Taken Time  BP 123/53 03/11/22 1318  Temp 36.9 C 03/11/22 1317  Pulse 75 03/11/22 1318  Resp 25 03/11/22 1318  SpO2 100 % 03/11/22 1318  Vitals shown include unvalidated device data.  Last Pain:  Vitals:   03/11/22 1317  TempSrc: Temporal         Complications: No notable events documented.

## 2022-03-11 NOTE — Interval H&P Note (Signed)
History and Physical Interval Note:  03/11/2022 12:41 PM  Taylor Valencia  has presented today for surgery, with the diagnosis of colon cancer screening.  The various methods of treatment have been discussed with the patient and family. After consideration of risks, benefits and other options for treatment, the patient has consented to  Procedure(s): COLONOSCOPY WITH PROPOFOL (N/A) as a surgical intervention.  The patient's history has been reviewed, patient examined, no change in status, stable for surgery.  I have reviewed the patient's chart and labs.  Questions were answered to the patient's satisfaction.     Lesly Rubenstein  Ok to proceed with colonoscopy

## 2022-03-11 NOTE — Op Note (Signed)
[facility]enter[facility] Patient Name: Taylor Valencia Procedure Date: 03/11/2022 12:03 PM MRN: [id number] Account #: [id number] Date of Birth: 09/18/1970 Admit Type: Outpatient Age: 52 Room: [facility] ENDO ROOM 3 Gender: Female Note Status: Finalized Instrument Name: Jasper Riling [id number] Procedure:             Colonoscopy Indications:           Screening for colorectal malignant neoplasm Providers:             Andrey Farmer MD, MD Referring MD:          Tracie Harrier, MD (Referring MD) Medicines:             Monitored Anesthesia Care Complications:         No immediate complications. Estimated blood loss:                         Minimal. Procedure:             Pre-Anesthesia Assessment:                        - Prior to the procedure, a History and Physical was                         performed, and patient medications and allergies were                         reviewed. The patient is competent. The risks and                         benefits of the procedure and the sedation options and                         risks were discussed with the patient. All questions                         were answered and informed consent was obtained.                         Patient identification and proposed procedure were                         verified by the physician, the nurse, the                         anesthesiologist, the anesthetist and the technician                         in the endoscopy suite. Mental Status Examination:                         alert and oriented. Airway Examination: normal                         oropharyngeal airway and neck mobility. Respiratory                         Examination: clear to auscultation. CV Examination:  normal. Prophylactic Antibiotics: The patient does not                         require prophylactic antibiotics. Prior                         Anticoagulants: The patient has taken no  anticoagulant                         or antiplatelet agents. ASA Grade Assessment: III - A                         patient with severe systemic disease. After reviewing                         the risks and benefits, the patient was deemed in                         satisfactory condition to undergo the procedure. The                         anesthesia plan was to use monitored anesthesia care                         (MAC). Immediately prior to administration of                         medications, the patient was re-assessed for adequacy                         to receive sedatives. The heart rate, respiratory                         rate, oxygen saturations, blood pressure, adequacy of                         pulmonary ventilation, and response to care were                         monitored throughout the procedure. The physical                         status of the patient was re-assessed after the                         procedure.                        After obtaining informed consent, the colonoscope was                         passed under direct vision. Throughout the procedure,                         the patient's blood pressure, pulse, and oxygen                         saturations were monitored continuously. The  Colonoscope was introduced through the anus and                         advanced to the the terminal ileum. The colonoscopy                         was performed without difficulty. The patient                         tolerated the procedure well. The quality of the bowel                         preparation was good. The terminal ileum, ileocecal                         valve, appendiceal orifice, and rectum were                         photographed. Findings:      The perianal and digital rectal examinations were normal.      The terminal ileum appeared normal.      A 2 mm polyp was found in the rectum. The polyp was sessile. The polyp        was removed with a jumbo cold forceps. Resection and retrieval were       complete. Estimated blood loss was minimal.      Internal hemorrhoids were found during retroflexion. The hemorrhoids       were Grade I (internal hemorrhoids that do not prolapse).      The exam was otherwise without abnormality on direct and retroflexion       views. Impression:            - The examined portion of the ileum was normal.                        - One 2 mm polyp in the rectum, removed with a jumbo                         cold forceps. Resected and retrieved.                        - Internal hemorrhoids.                        - The examination was otherwise normal on direct and                         retroflexion views. Recommendation:        - Discharge patient to home.                        - Resume previous diet.                        - Continue present medications.                        - Await pathology results.                        - Repeat  colonoscopy for surveillance based on                         pathology results.                        - Return to referring physician as previously                         scheduled. Procedure Code(s):     --- Professional ---                        [phone], Colonoscopy, flexible; with biopsy, single or                         multiple Diagnosis Code(s):     --- Professional ---                        Z12.11, Encounter for screening for malignant neoplasm                         of colon                        K64.0, First degree hemorrhoids                        D12.8, Benign neoplasm of rectum CPT copyright 2022 American Medical Association. All rights reserved. The codes documented in this report are preliminary and upon coder review may  be revised to meet current compliance requirements. Andrey Farmer MD, MD 03/11/2022 1:16:39 PM Number of Addenda: 0 Note Initiated On: 03/11/2022 12:03 PM Scope Withdrawal Time: 0 hours 10 minutes 8  seconds  Total Procedure Duration: 0 hours 13 minutes 59 seconds  Estimated Blood Loss:  Estimated blood loss was minimal.      [facility]

## 2022-03-12 ENCOUNTER — Encounter: Payer: Self-pay | Admitting: Gastroenterology

## 2022-03-12 LAB — SURGICAL PATHOLOGY

## 2022-03-12 NOTE — Anesthesia Postprocedure Evaluation (Signed)
Anesthesia Post Note  Patient: Taylor Valencia  Procedure(s) Performed: COLONOSCOPY WITH PROPOFOL  Patient location during evaluation: PACU Anesthesia Type: General Level of consciousness: awake and alert Pain management: pain level controlled Vital Signs Assessment: post-procedure vital signs reviewed and stable Respiratory status: spontaneous breathing, nonlabored ventilation and respiratory function stable Cardiovascular status: blood pressure returned to baseline and stable Postop Assessment: no apparent nausea or vomiting Anesthetic complications: no   No notable events documented.   Last Vitals:  Vitals:   03/11/22 1327 03/11/22 1337  BP: 118/71 120/74  Pulse: 79 73  Resp: 18 14  Temp:    SpO2: 98% 100%    Last Pain:  Vitals:   03/11/22 1337  TempSrc:   PainSc: 0-No pain                 [place] Ouch

## 2022-04-15 ENCOUNTER — Other Ambulatory Visit: Payer: Self-pay | Admitting: Internal Medicine

## 2022-04-15 DIAGNOSIS — Z1231 Encounter for screening mammogram for malignant neoplasm of breast: Secondary | ICD-10-CM

## 2022-09-19 ENCOUNTER — Encounter: Payer: Self-pay | Admitting: Podiatry

## 2022-09-19 ENCOUNTER — Ambulatory Visit (INDEPENDENT_AMBULATORY_CARE_PROVIDER_SITE_OTHER): Payer: 59 | Admitting: Podiatry

## 2022-09-19 DIAGNOSIS — M722 Plantar fascial fibromatosis: Secondary | ICD-10-CM

## 2022-09-19 NOTE — Progress Notes (Signed)
Subjective:  Patient ID: Taylor Valencia, female    DOB: 06-Dec-1970,  MRN: [id number]  Chief Complaint  Patient presents with   Plantar Fasciitis    Right heel pain     52 y.o. female presents with the above complaint.  Patient presents with right heel pain.  Patient states that has been present for quite some time is progressively gotten worse.  She would like to discuss treatment options.  She has not seen anyone else prior to seeing me.  Pain scale 7 out of 10 dull aching in nature.   Review of Systems: Negative except as noted in the HPI. Denies N/V/F/Ch.  Past Medical History:  Diagnosis Date   Arthritis    Chronic back pain    History of bronchitis    Hypertension     Current Outpatient Medications:    Biotin w/ Vitamins C & E (HAIR SKIN & NAILS GUMMIES) 1250-7.5-7.5 MCG-MG-UNT CHEW, Chew 2 tablets by mouth daily. (Patient not taking: Reported on 07/27/2019), Disp: , Rfl:    carboxymethylcellulose (REFRESH PLUS) 0.5 % SOLN, Place 1 drop into both eyes 3 (three) times daily as needed. (Patient not taking: Reported on 07/27/2019), Disp: , Rfl:    clotrimazole (LOTRIMIN) 1 % cream, Apply 1 application topically 2 (two) times daily., Disp: 30 g, Rfl: 0   Gymnema Sylvestris Leaf POWD, 1 Dose by Does not apply route 2 (two) times daily. (Patient not taking: Reported on 07/27/2019), Disp: , Rfl:    lisinopril-hydrochlorothiazide (ZESTORETIC) 10-12.5 MG tablet, Take 1 tablet by mouth daily., Disp: 90 tablet, Rfl: 3   meloxicam (MOBIC) 15 MG tablet, TAKE 1 TABLET(15 MG) BY MOUTH DAILY, Disp: 90 tablet, Rfl: 0   methocarbamol (ROBAXIN) 500 MG tablet, Take 1 tablet (500 mg total) by mouth 4 (four) times daily. (Patient not taking: Reported on 07/27/2019), Disp: 30 tablet, Rfl: 0   sertraline (ZOLOFT) 50 MG tablet, Take 1 tablet (50 mg total) by mouth daily., Disp: 90 tablet, Rfl: 3   traMADol (ULTRAM) 50 MG tablet, Take 50 mg by mouth every 6 (six) hours as needed for moderate pain.  (Patient not taking: Reported on 07/27/2019), Disp: , Rfl:   Social History   Tobacco Use  Smoking Status Never  Smokeless Tobacco Never    Allergies  Allergen Reactions   No Known Allergies    Objective:  There were no vitals filed for this visit. There is no height or weight on file to calculate BMI. Constitutional Well developed. Well nourished.  Vascular Dorsalis pedis pulses palpable bilaterally. Posterior tibial pulses palpable bilaterally. Capillary refill normal to all digits.  No cyanosis or clubbing noted. Pedal hair growth normal.  Neurologic Normal speech. Oriented to person, place, and time. Epicritic sensation to light touch grossly present bilaterally.  Dermatologic Nails well groomed and normal in appearance. No open wounds. No skin lesions.  Orthopedic: Normal joint ROM without pain or crepitus bilaterally. No visible deformities. Tender to palpation at the calcaneal tuber right. No pain with calcaneal squeeze right. Ankle ROM diminished range of motion right. Silfverskiold Test: positive right.   Radiographs: None  Assessment:   1. Plantar fasciitis of right foot    Plan:  Patient was evaluated and treated and all questions answered.  Plantar Fasciitis, right - XR reviewed as above.  - Educated on icing and stretching. Instructions given.  - Injection delivered to the plantar fascia as below. - DME: Plantar fascial brace dispensed to support the medial longitudinal arch of the foot  and offload pressure from the heel and prevent arch collapse during weightbearing - Pharmacologic management: None  Procedure: Injection Tendon/Ligament Location: Right plantar fascia at the glabrous junction; medial approach. Skin Prep: alcohol Injectate: 0.5 cc 0.5% marcaine plain, 0.5 cc of 1% Lidocaine, 0.5 cc kenalog 10. Disposition: Patient tolerated procedure well. Injection site dressed with a band-aid.  No follow-ups on file.

## 2022-10-17 ENCOUNTER — Ambulatory Visit (INDEPENDENT_AMBULATORY_CARE_PROVIDER_SITE_OTHER): Payer: 59 | Admitting: Podiatry

## 2022-10-17 DIAGNOSIS — M722 Plantar fascial fibromatosis: Secondary | ICD-10-CM | POA: Diagnosis not present

## 2022-10-17 DIAGNOSIS — Q666 Other congenital valgus deformities of feet: Secondary | ICD-10-CM

## 2022-10-17 NOTE — Progress Notes (Signed)
Subjective:  Patient ID: Taylor Valencia, female    DOB: Jan 05, 1971,  MRN: [id number]  Chief Complaint  Patient presents with   Plantar Fasciitis    Pt stated that she is doing better    52 y.o. female presents with the above complaint.  Patient presents for follow-up of right plantar fasciitis.  She states she is doing a lot better.  The injection helped.  She still has some residual pain.   Review of Systems: Negative except as noted in the HPI. Denies N/V/F/Ch.  Past Medical History:  Diagnosis Date   Arthritis    Chronic back pain    History of bronchitis    Hypertension     Current Outpatient Medications:    Biotin w/ Vitamins C & E (HAIR SKIN & NAILS GUMMIES) 1250-7.5-7.5 MCG-MG-UNT CHEW, Chew 2 tablets by mouth daily. (Patient not taking: Reported on 07/27/2019), Disp: , Rfl:    carboxymethylcellulose (REFRESH PLUS) 0.5 % SOLN, Place 1 drop into both eyes 3 (three) times daily as needed. (Patient not taking: Reported on 07/27/2019), Disp: , Rfl:    clotrimazole (LOTRIMIN) 1 % cream, Apply 1 application topically 2 (two) times daily., Disp: 30 g, Rfl: 0   Gymnema Sylvestris Leaf POWD, 1 Dose by Does not apply route 2 (two) times daily. (Patient not taking: Reported on 07/27/2019), Disp: , Rfl:    lisinopril-hydrochlorothiazide (ZESTORETIC) 10-12.5 MG tablet, Take 1 tablet by mouth daily., Disp: 90 tablet, Rfl: 3   meloxicam (MOBIC) 15 MG tablet, TAKE 1 TABLET(15 MG) BY MOUTH DAILY, Disp: 90 tablet, Rfl: 0   methocarbamol (ROBAXIN) 500 MG tablet, Take 1 tablet (500 mg total) by mouth 4 (four) times daily. (Patient not taking: Reported on 07/27/2019), Disp: 30 tablet, Rfl: 0   sertraline (ZOLOFT) 50 MG tablet, Take 1 tablet (50 mg total) by mouth daily., Disp: 90 tablet, Rfl: 3   traMADol (ULTRAM) 50 MG tablet, Take 50 mg by mouth every 6 (six) hours as needed for moderate pain. (Patient not taking: Reported on 07/27/2019), Disp: , Rfl:   Social History   Tobacco Use   Smoking Status Never  Smokeless Tobacco Never    Allergies  Allergen Reactions   No Known Allergies    Objective:  There were no vitals filed for this visit. There is no height or weight on file to calculate BMI. Constitutional Well developed. Well nourished.  Vascular Dorsalis pedis pulses palpable bilaterally. Posterior tibial pulses palpable bilaterally. Capillary refill normal to all digits.  No cyanosis or clubbing noted. Pedal hair growth normal.  Neurologic Normal speech. Oriented to person, place, and time. Epicritic sensation to light touch grossly present bilaterally.  Dermatologic Nails well groomed and normal in appearance. No open wounds. No skin lesions.  Orthopedic: Normal joint ROM without pain or crepitus bilaterally. No visible deformities. Tender to palpation at the calcaneal tuber right. No pain with calcaneal squeeze right. Ankle ROM diminished range of motion right. Silfverskiold Test: positive right.   Radiographs: None  Assessment:   1. Plantar fasciitis of right foot   2. Pes planovalgus     Plan:  Patient was evaluated and treated and all questions answered.  Plantar Fasciitis, right - XR reviewed as above.  - Educated on icing and stretching. Instructions given.  -Second injection delivered to the plantar fascia as below. - DME: Plantar fascial brace dispensed to support the medial longitudinal arch of the foot and offload pressure from the heel and prevent arch collapse during weightbearing - Pharmacologic  management: None  Pes planovalgus -I explained to patient the etiology of pes planovalgus and relationship with Planter fasciitis and various treatment options were discussed.  Given patient foot structure in the setting of Planter fasciitis I believe patient will benefit from custom-made orthotics to help control the hindfoot motion support the arch of the foot and take the stress away from plantar fascial.  Patient agrees with the  plan like to proceed with orthotics -Patient was casted for orthotics   Procedure: Injection Tendon/Ligament Location: Right plantar fascia at the glabrous junction; medial approach. Skin Prep: alcohol Injectate: 0.5 cc 0.5% marcaine plain, 0.5 cc of 1% Lidocaine, 0.5 cc kenalog 10. Disposition: Patient tolerated procedure well. Injection site dressed with a band-aid.  No follow-ups on file.  Second injection orthotics pes planovalgus

## 2022-10-28 NOTE — Progress Notes (Signed)
Order placed for orthotics patient will be scheduled a fitting when in  [employer], CFo, CFm

## 2022-12-06 ENCOUNTER — Other Ambulatory Visit: Payer: 59

## 2023-01-24 ENCOUNTER — Other Ambulatory Visit: Payer: 59

## 2023-04-17 ENCOUNTER — Encounter: Payer: Self-pay | Admitting: Podiatry

## 2023-04-17 ENCOUNTER — Ambulatory Visit (INDEPENDENT_AMBULATORY_CARE_PROVIDER_SITE_OTHER): Admitting: Podiatry

## 2023-04-17 DIAGNOSIS — Q666 Other congenital valgus deformities of feet: Secondary | ICD-10-CM | POA: Diagnosis not present

## 2023-04-17 DIAGNOSIS — M722 Plantar fascial fibromatosis: Secondary | ICD-10-CM | POA: Diagnosis not present

## 2023-04-17 NOTE — Progress Notes (Signed)
 Subjective:  Patient ID: Taylor Valencia, female    DOB: 1971/01/20,  MRN: [id number]  Chief Complaint  Patient presents with   Plantar Fasciitis    53 y.o. female presents with the above complaint.  Patient presents for follow-up of right plantar fasciitis.  She states this started coming back is hurting her.  She has not gotten her orthotics denies any other acute complaints   Review of Systems: Negative except as noted in the HPI. Denies N/V/F/Ch.  Past Medical History:  Diagnosis Date   Arthritis    Chronic back pain    History of bronchitis    Hypertension     Current Outpatient Medications:    Biotin w/ Vitamins C & E (HAIR SKIN & NAILS GUMMIES) 1250-7.5-7.5 MCG-MG-UNT CHEW, Chew 2 tablets by mouth daily. (Patient not taking: Reported on 07/27/2019), Disp: , Rfl:    carboxymethylcellulose (REFRESH PLUS) 0.5 % SOLN, Place 1 drop into both eyes 3 (three) times daily as needed. (Patient not taking: Reported on 07/27/2019), Disp: , Rfl:    clotrimazole (LOTRIMIN) 1 % cream, Apply 1 application topically 2 (two) times daily., Disp: 30 g, Rfl: 0   Gymnema Sylvestris Leaf POWD, 1 Dose by Does not apply route 2 (two) times daily. (Patient not taking: Reported on 07/27/2019), Disp: , Rfl:    lisinopril-hydrochlorothiazide (ZESTORETIC) 10-12.5 MG tablet, Take 1 tablet by mouth daily., Disp: 90 tablet, Rfl: 3   meloxicam (MOBIC) 15 MG tablet, TAKE 1 TABLET(15 MG) BY MOUTH DAILY, Disp: 90 tablet, Rfl: 0   methocarbamol (ROBAXIN) 500 MG tablet, Take 1 tablet (500 mg total) by mouth 4 (four) times daily. (Patient not taking: Reported on 07/27/2019), Disp: 30 tablet, Rfl: 0   sertraline (ZOLOFT) 50 MG tablet, Take 1 tablet (50 mg total) by mouth daily., Disp: 90 tablet, Rfl: 3   traMADol (ULTRAM) 50 MG tablet, Take 50 mg by mouth every 6 (six) hours as needed for moderate pain. (Patient not taking: Reported on 07/27/2019), Disp: , Rfl:   Social History   Tobacco Use  Smoking Status  Never  Smokeless Tobacco Never    Allergies  Allergen Reactions   No Known Allergies    Objective:  There were no vitals filed for this visit. There is no height or weight on file to calculate BMI. Constitutional Well developed. Well nourished.  Vascular Dorsalis pedis pulses palpable bilaterally. Posterior tibial pulses palpable bilaterally. Capillary refill normal to all digits.  No cyanosis or clubbing noted. Pedal hair growth normal.  Neurologic Normal speech. Oriented to person, place, and time. Epicritic sensation to light touch grossly present bilaterally.  Dermatologic Nails well groomed and normal in appearance. No open wounds. No skin lesions.  Orthopedic: Normal joint ROM without pain or crepitus bilaterally. No visible deformities. Tender to palpation at the calcaneal tuber right. No pain with calcaneal squeeze right. Ankle ROM diminished range of motion right. Silfverskiold Test: positive right.   Radiographs: None  Assessment:   No diagnosis found.   Plan:  Patient was evaluated and treated and all questions answered.  Plantar Fasciitis, right~recurrence - XR reviewed as above.  - Educated on icing and stretching. Instructions given.  -injection delivered to the plantar fascia as below. - DME: Plantar fascial brace dispensed to support the medial longitudinal arch of the foot and offload pressure from the heel and prevent arch collapse during weightbearing - Pharmacologic management: None  Pes planovalgus -I explained to patient the etiology of pes planovalgus and relationship with Planter fasciitis  and various treatment options were discussed.  Given patient foot structure in the setting of Planter fasciitis I believe patient will benefit from custom-made orthotics to help control the hindfoot motion support the arch of the foot and take the stress away from plantar fascial.  Patient agrees with the plan like to proceed with orthotics -Orthotics  dispensed they are functioning well   Procedure: Injection Tendon/Ligament Location: Right plantar fascia at the glabrous junction; medial approach. Skin Prep: alcohol Injectate: 0.5 cc 0.5% marcaine plain, 0.5 cc of 1% Lidocaine, 0.5 cc kenalog 10. Disposition: Patient tolerated procedure well. Injection site dressed with a band-aid.  No follow-ups on file.  Second injection orthotics pes planovalgus

## 2023-04-22 ENCOUNTER — Other Ambulatory Visit: Payer: Self-pay | Admitting: Internal Medicine

## 2023-04-22 DIAGNOSIS — Z1231 Encounter for screening mammogram for malignant neoplasm of breast: Secondary | ICD-10-CM

## 2023-05-08 ENCOUNTER — Ambulatory Visit
Admission: RE | Admit: 2023-05-08 | Discharge: 2023-05-08 | Disposition: A | Discharge status: Home / Self Care | Payer: Self-pay | Source: Ambulatory Visit | Attending: Internal Medicine | Admitting: Internal Medicine

## 2023-05-08 DIAGNOSIS — Z1231 Encounter for screening mammogram for malignant neoplasm of breast: Secondary | ICD-10-CM | POA: Insufficient documentation

## 2023-05-15 ENCOUNTER — Other Ambulatory Visit: Payer: Self-pay | Admitting: Surgery

## 2023-05-15 ENCOUNTER — Ambulatory Visit: Admitting: Podiatry

## 2023-05-20 ENCOUNTER — Ambulatory Visit: Admitting: Podiatry

## 2023-05-21 ENCOUNTER — Other Ambulatory Visit

## 2023-05-26 ENCOUNTER — Encounter
Admission: RE | Admit: 2023-05-26 | Discharge: 2023-05-26 | Disposition: A | Discharge status: Home / Self Care | Source: Ambulatory Visit | Attending: Surgery | Admitting: Surgery

## 2023-05-26 ENCOUNTER — Other Ambulatory Visit: Payer: Self-pay

## 2023-05-26 VITALS — Ht 60.0 in | Wt 254.0 lb

## 2023-05-26 DIAGNOSIS — I10 Essential (primary) hypertension: Secondary | ICD-10-CM | POA: Insufficient documentation

## 2023-05-26 DIAGNOSIS — E66813 Obesity, class 3: Secondary | ICD-10-CM | POA: Diagnosis not present

## 2023-05-26 DIAGNOSIS — Z8249 Family history of ischemic heart disease and other diseases of the circulatory system: Secondary | ICD-10-CM | POA: Diagnosis not present

## 2023-05-26 DIAGNOSIS — G5601 Carpal tunnel syndrome, right upper limb: Secondary | ICD-10-CM | POA: Diagnosis present

## 2023-05-26 DIAGNOSIS — M65331 Trigger finger, right middle finger: Secondary | ICD-10-CM | POA: Diagnosis present

## 2023-05-26 DIAGNOSIS — M1990 Unspecified osteoarthritis, unspecified site: Secondary | ICD-10-CM | POA: Diagnosis not present

## 2023-05-26 DIAGNOSIS — I4519 Other right bundle-branch block: Secondary | ICD-10-CM | POA: Insufficient documentation

## 2023-05-26 DIAGNOSIS — Z01810 Encounter for preprocedural cardiovascular examination: Secondary | ICD-10-CM | POA: Insufficient documentation

## 2023-05-26 DIAGNOSIS — Z6841 Body Mass Index (BMI) 40.0 and over, adult: Secondary | ICD-10-CM | POA: Diagnosis not present

## 2023-05-26 HISTORY — DX: Prediabetes: R73.03

## 2023-05-26 NOTE — Patient Instructions (Addendum)
 Your procedure is scheduled on:Wednesday 05/28/23  Report to the Registration Desk on the 1st floor of the [facility]. To find out your arrival time, please call [phone] between 1PM - 3PM on: Tuesday 05/27/23 If your arrival time is 6:00 am, do not arrive before that time as the [facility] entrance doors do not open until 6:00 am.  REMEMBER: Instructions that are not followed completely may result in serious medical risk, up to and including death; or upon the discretion of your surgeon and anesthesiologist your surgery may need to be rescheduled.  Do not eat food after midnight the night before surgery.  No gum chewing or hard candies.  You may however, drink CLEAR liquids up to 2 hours before you are scheduled to arrive for your surgery. Do not drink anything within 2 hours of your scheduled arrival time.  Clear liquids include: - water  - apple juice without pulp - gatorade (not RED colors) - black coffee or tea (Do NOT add milk or creamers to the coffee or tea) Do NOT drink anything that is not on this list.   In addition, your doctor has ordered for you to drink the provided:  Ensure Pre-Surgery Clear Carbohydrate Drink  Drinking this carbohydrate drink up to two hours before surgery helps to reduce insulin resistance and improve patient outcomes. Please complete drinking 2 hours before scheduled arrival time.  One week prior to surgery: Stop Anti-inflammatories (NSAIDS) such as Advil, Aleve, Ibuprofen, meloxicam  (MOBIC ), Motrin, Naproxen, Naprosyn and Aspirin based products such as Excedrin, Goody's Powder, BC Powder. Stop ANY OVER THE COUNTER supplements until after surgery. Biotin w/ Vitamins C & E  Magnesium Glycinate   You may however, continue to take Tylenol  if needed for pain up until the day of surgery.   Continue taking all of your other prescription medications up until the day of surgery.  ON THE DAY OF SURGERY ONLY TAKE THESE MEDICATIONS WITH SIPS OF  WATER:  None    No Alcohol for 24 hours before or after surgery.  No Smoking including e-cigarettes for 24 hours before surgery.  No chewable tobacco products for at least 6 hours before surgery.  No nicotine patches on the day of surgery.  Do not use any "recreational" drugs for at least a week (preferably 2 weeks) before your surgery.  Please be advised that the combination of cocaine and anesthesia may have negative outcomes, up to and including death. If you test positive for cocaine, your surgery will be cancelled.  On the morning of surgery brush your teeth with toothpaste and water, you may rinse your mouth with mouthwash if you wish. Do not swallow any toothpaste or mouthwash.  Use CHG Soap or wipes as directed on instruction sheet.  Do not wear jewelry, make-up, hairpins, clips or nail polish.  For welded (permanent) jewelry: bracelets, anklets, waist bands, etc.  Please have this removed prior to surgery.  If it is not removed, there is a chance that hospital personnel will need to cut it off on the day of surgery.  Do not wear lotions, powders, or perfumes.   Do not shave body hair from the neck down 48 hours before surgery.  Contact lenses, hearing aids and dentures may not be worn into surgery.  Do not bring valuables to the hospital. [facility] is not responsible for any missing/lost belongings or valuables.    Notify your doctor if there is any change in your medical condition (cold, fever, infection).  Wear  comfortable clothing (specific to your surgery type) to the hospital.  After surgery, you can help prevent lung complications by doing breathing exercises.  Take deep breaths and cough every 1-2 hours. Your doctor may order a device called an Incentive Spirometer to help you take deep breaths. When coughing or sneezing, hold a pillow firmly against your incision with both hands. This is called "splinting." Doing this helps protect your incision. It also  decreases belly discomfort.  If you are being admitted to the hospital overnight, leave your suitcase in the car. After surgery it may be brought to your room.  In case of increased patient census, it may be necessary for you, the patient, to continue your postoperative care in the Same Day Surgery department.  If you are being discharged the day of surgery, you will not be allowed to drive home. You will need a responsible individual to drive you home and stay with you for 24 hours after surgery.   If you are taking public transportation, you will need to have a responsible individual with you.  Please call the Pre-admissions Testing Dept. at [phone] if you have any questions about these instructions.  Surgery Visitation Policy:  Patients having surgery or a procedure may have two visitors.  Children under the age of 66 must have an adult with them who is not the patient.  Inpatient Visitation:    Visiting hours are 7 a.m. to 8 p.m. Up to four visitors are allowed at one time in a patient room. The visitors may rotate out with other people during the day.  One visitor age 4 or older may stay with the patient overnight and must be in the room by 8 p.m.    Preparing for Surgery with CHLORHEXIDINE  GLUCONATE (CHG) Soap  Chlorhexidine  Gluconate (CHG) Soap  o An antiseptic cleaner that kills germs and bonds with the skin to continue killing germs even after washing  o Used for showering the night before surgery and morning of surgery  Before surgery, you can play an important role by reducing the number of germs on your skin.  CHG (Chlorhexidine  gluconate) soap is an antiseptic cleanser which kills germs and bonds with the skin to continue killing germs even after washing.  Please do not use if you have an allergy to CHG or antibacterial soaps. If your skin becomes reddened/irritated stop using the CHG.  1. Shower the NIGHT BEFORE SURGERY and the MORNING OF SURGERY with CHG  soap.  2. If you choose to wash your hair, wash your hair first as usual with your normal shampoo.  3. After shampooing, rinse your hair and body thoroughly to remove the shampoo.  4. Use CHG as you would any other liquid soap. You can apply CHG directly to the skin and wash gently with a scrungie or a clean washcloth.  5. Apply the CHG soap to your body only from the neck down. Do not use on open wounds or open sores. Avoid contact with your eyes, ears, mouth, and genitals (private parts). Wash face and genitals (private parts) with your normal soap.  6. Wash thoroughly, paying special attention to the area where your surgery will be performed.  7. Thoroughly rinse your body with warm water.  8. Do not shower/wash with your normal soap after using and rinsing off the CHG soap.  9. Pat yourself dry with a clean towel.  10. Wear clean pajamas to bed the night before surgery.  12. Place clean sheets on  your bed the night of your first shower and do not sleep with pets.  13. Shower again with the CHG soap on the day of surgery prior to arriving at the hospital.  14. Do not apply any deodorants/lotions/powders.  15. Please wear clean clothes to the hospital.  How to Use an Incentive Spirometer  An incentive spirometer is a tool that measures how well you are filling your lungs with each breath. Learning to take long, deep breaths using this tool can help you keep your lungs clear and active. This may help to reverse or lessen your chance of developing breathing (pulmonary) problems, especially infection. You may be asked to use a spirometer: After a surgery. If you have a lung problem or a history of smoking. After a long period of time when you have been unable to move or be active. If the spirometer includes an indicator to show the highest number that you have reached, your health care provider or respiratory therapist will help you set a goal. Keep a log of your progress as told by  your health care provider. What are the risks? Breathing too quickly may cause dizziness or cause you to pass out. Take your time so you do not get dizzy or light-headed. If you are in pain, you may need to take pain medicine before doing incentive spirometry. It is harder to take a deep breath if you are having pain. How to use your incentive spirometer  Sit up on the edge of your bed or on a chair. Hold the incentive spirometer so that it is in an upright position. Before you use the spirometer, breathe out normally. Place the mouthpiece in your mouth. Make sure your lips are closed tightly around it. Breathe in slowly and as deeply as you can through your mouth, causing the piston or the ball to rise toward the top of the chamber. Hold your breath for 3-5 seconds, or for as long as possible. If the spirometer includes a coach indicator, use this to guide you in breathing. Slow down your breathing if the indicator goes above the marked areas. Remove the mouthpiece from your mouth and breathe out normally. The piston or ball will return to the bottom of the chamber. Rest for a few seconds, then repeat the steps 10 or more times. Take your time and take a few normal breaths between deep breaths so that you do not get dizzy or light-headed. Do this every 1-2 hours when you are awake. If the spirometer includes a goal marker to show the highest number you have reached (best effort), use this as a goal to work toward during each repetition. After each set of 10 deep breaths, cough a few times. This will help to make sure that your lungs are clear. If you have an incision on your chest or abdomen from surgery, place a pillow or a rolled-up towel firmly against the incision when you cough. This can help to reduce pain while taking deep breaths and coughing. General tips When you are able to get out of bed: Walk around often. Continue to take deep breaths and cough in order to clear your  lungs. Keep using the incentive spirometer until your health care provider says it is okay to stop using it. If you have been in the hospital, you may be told to keep using the spirometer at home. Contact a health care provider if: You are having difficulty using the spirometer. You have trouble using the spirometer as  often as instructed. Your pain medicine is not giving enough relief for you to use the spirometer as told. You have a fever. Get help right away if: You develop shortness of breath. You develop a cough with bloody mucus from the lungs. You have fluid or blood coming from an incision site after you cough. Summary An incentive spirometer is a tool that can help you learn to take long, deep breaths to keep your lungs clear and active. You may be asked to use a spirometer after a surgery, if you have a lung problem or a history of smoking, or if you have been inactive for a long period of time. Use your incentive spirometer as instructed every 1-2 hours while you are awake. If you have an incision on your chest or abdomen, place a pillow or a rolled-up towel firmly against your incision when you cough. This will help to reduce pain. Get help right away if you have shortness of breath, you cough up bloody mucus, or blood comes from your incision when you cough. This information is not intended to replace advice given to you by your health care provider. Make sure you discuss any questions you have with your health care provider. Document Revised: 04/05/2019 Document Reviewed: 04/05/2019 Elsevier Patient Education  2023 [employer].

## 2023-05-27 ENCOUNTER — Ambulatory Visit (INDEPENDENT_AMBULATORY_CARE_PROVIDER_SITE_OTHER): Admitting: Podiatry

## 2023-05-27 DIAGNOSIS — M62461 Contracture of muscle, right lower leg: Secondary | ICD-10-CM | POA: Diagnosis not present

## 2023-05-27 DIAGNOSIS — M722 Plantar fascial fibromatosis: Secondary | ICD-10-CM

## 2023-05-27 NOTE — Progress Notes (Signed)
 Subjective:  Patient ID: Taylor Valencia, female    DOB: 1970-08-23,  MRN: [id number]  Chief Complaint  Patient presents with   Plantar Fasciitis    53 y.o. female presents with the above complaint.  Patient presents for follow-up of right plantar fasciitis.  She states the injection helped.  She would like to discuss another injection.  She states the injection helps.  Review of Systems: Negative except as noted in the HPI. Denies N/V/F/Ch.  Past Medical History:  Diagnosis Date   Arthritis    Chronic back pain    History of bronchitis    Hypertension    Pre-diabetes     Current Outpatient Medications:    acetaminophen  (TYLENOL ) 650 MG CR tablet, Take 1,300 mg by mouth every 8 (eight) hours as needed for pain., Disp: , Rfl:    Biotin w/ Vitamins C & E (HAIR SKIN & NAILS GUMMIES) 1250-7.5-7.5 MCG-MG-UNT CHEW, Chew 2 tablets by mouth daily. (Patient not taking: Reported on 07/27/2019), Disp: , Rfl:    hydrochlorothiazide  (MICROZIDE ) 12.5 MG capsule, Take 12.5 mg by mouth daily., Disp: , Rfl:    ketoconazole (NIZORAL) 2 % cream, Apply 1 Application topically daily as needed for irritation., Disp: , Rfl:    lisinopril  (ZESTRIL ) 10 MG tablet, Take 10 mg by mouth daily., Disp: , Rfl:    Magnesium Glycinate 120 MG CAPS, Take 120 mg by mouth 2 (two) times daily., Disp: , Rfl:    meloxicam  (MOBIC ) 15 MG tablet, TAKE 1 TABLET(15 MG) BY MOUTH DAILY, Disp: 90 tablet, Rfl: 0  Social History   Tobacco Use  Smoking Status Never  Smokeless Tobacco Never    No Known Allergies  Objective:  There were no vitals filed for this visit. There is no height or weight on file to calculate BMI. Constitutional Well developed. Well nourished.  Vascular Dorsalis pedis pulses palpable bilaterally. Posterior tibial pulses palpable bilaterally. Capillary refill normal to all digits.  No cyanosis or clubbing noted. Pedal hair growth normal.  Neurologic Normal speech. Oriented to person,  place, and time. Epicritic sensation to light touch grossly present bilaterally.  Dermatologic Nails well groomed and normal in appearance. No open wounds. No skin lesions.  Orthopedic: Normal joint ROM without pain or crepitus bilaterally. No visible deformities. Tender to palpation at the calcaneal tuber right. No pain with calcaneal squeeze right. Ankle ROM diminished range of motion right. Silfverskiold Test: positive right.   Radiographs: None  Assessment:   No diagnosis found.   Plan:  Patient was evaluated and treated and all questions answered.  Plantar Fasciitis, right~recurrence with underlying gastrocnemius equinus - XR reviewed as above.  - Educated on icing and stretching. Instructions given.  - second injection delivered to the plantar fascia as below. - DME: Plantar fascial brace dispensed to support the medial longitudinal arch of the foot and offload pressure from the heel and prevent arch collapse during weightbearing - Pharmacologic management: None  Pes planovalgus -I explained to patient the etiology of pes planovalgus and relationship with Planter fasciitis and various treatment options were discussed.  Given patient foot structure in the setting of Planter fasciitis I believe patient will benefit from custom-made orthotics to help control the hindfoot motion support the arch of the foot and take the stress away from plantar fascial.  Patient agrees with the plan like to proceed with orthotics -Orthotics dispensed they are functioning well   Procedure: Injection Tendon/Ligament Location: Right plantar fascia at the glabrous junction; medial approach. Skin Prep:  alcohol Injectate: 0.5 cc 0.5% marcaine  plain, 0.5 cc of 1% Lidocaine , 0.5 cc kenalog 10. Disposition: Patient tolerated procedure well. Injection site dressed with a band-aid.  No follow-ups on file.  Second injection orthotics pes planovalgus

## 2023-05-28 ENCOUNTER — Ambulatory Visit: Admitting: Anesthesiology

## 2023-05-28 ENCOUNTER — Encounter
Admission: RE | Disposition: A | Discharge status: Home / Self Care | Payer: Self-pay | Source: Home / Self Care | Attending: Surgery

## 2023-05-28 ENCOUNTER — Encounter: Payer: Self-pay | Admitting: Surgery

## 2023-05-28 ENCOUNTER — Ambulatory Visit
Admission: RE | Admit: 2023-05-28 | Discharge: 2023-05-28 | Disposition: A | Discharge status: Home / Self Care | Attending: Surgery | Admitting: Surgery

## 2023-05-28 ENCOUNTER — Other Ambulatory Visit: Payer: Self-pay

## 2023-05-28 DIAGNOSIS — E66813 Obesity, class 3: Secondary | ICD-10-CM | POA: Insufficient documentation

## 2023-05-28 DIAGNOSIS — I10 Essential (primary) hypertension: Secondary | ICD-10-CM | POA: Insufficient documentation

## 2023-05-28 DIAGNOSIS — G5601 Carpal tunnel syndrome, right upper limb: Secondary | ICD-10-CM | POA: Insufficient documentation

## 2023-05-28 DIAGNOSIS — Z6841 Body Mass Index (BMI) 40.0 and over, adult: Secondary | ICD-10-CM | POA: Insufficient documentation

## 2023-05-28 DIAGNOSIS — Z8249 Family history of ischemic heart disease and other diseases of the circulatory system: Secondary | ICD-10-CM | POA: Insufficient documentation

## 2023-05-28 DIAGNOSIS — M1990 Unspecified osteoarthritis, unspecified site: Secondary | ICD-10-CM | POA: Insufficient documentation

## 2023-05-28 DIAGNOSIS — M65331 Trigger finger, right middle finger: Secondary | ICD-10-CM | POA: Insufficient documentation

## 2023-05-28 SURGERY — RELEASE, CARPAL TUNNEL, ENDOSCOPIC
Anesthesia: General | Site: Wrist | Laterality: Right

## 2023-05-28 MED ORDER — CEFAZOLIN SODIUM-DEXTROSE 2-4 GM/100ML-% IV SOLN
2.0000 g | INTRAVENOUS | Status: AC
  Administered 2023-05-28: 2 g via INTRAVENOUS

## 2023-05-28 MED ORDER — PROPOFOL 10 MG/ML IV BOLUS
INTRAVENOUS | Status: DC | PRN
  Administered 2023-05-28: 200 mg via INTRAVENOUS

## 2023-05-28 MED ORDER — NEOMYCIN-POLYMYXIN B GU 40-200000 IR SOLN
Status: AC
  Filled 2023-05-28: qty 2

## 2023-05-28 MED ORDER — PROPOFOL 10 MG/ML IV BOLUS
INTRAVENOUS | Status: AC
  Filled 2023-05-28: qty 20

## 2023-05-28 MED ORDER — METOCLOPRAMIDE HCL 5 MG/ML IJ SOLN: 5.0000 mg | Freq: Three times a day (TID) | INTRAMUSCULAR | Status: DC | PRN

## 2023-05-28 MED ORDER — FENTANYL CITRATE (PF) 100 MCG/2ML IJ SOLN: 25.0000 ug | INTRAMUSCULAR | Status: DC | PRN

## 2023-05-28 MED ORDER — BUPIVACAINE HCL (PF) 0.5 % IJ SOLN
INTRAMUSCULAR | Status: DC | PRN
Start: 2023-05-28 — End: 2023-05-28
  Administered 2023-05-28: 15 mL

## 2023-05-28 MED ORDER — ONDANSETRON HCL 4 MG/2ML IJ SOLN
4.0000 mg | Freq: Four times a day (QID) | INTRAMUSCULAR | Status: DC | PRN
Start: 2023-05-28 — End: 2023-05-28

## 2023-05-28 MED ORDER — LACTATED RINGERS IV SOLN: INTRAVENOUS | Status: DC | PRN

## 2023-05-28 MED ORDER — DEXAMETHASONE SODIUM PHOSPHATE 10 MG/ML IJ SOLN
INTRAMUSCULAR | Status: DC | PRN
  Administered 2023-05-28: 5 mg via INTRAVENOUS

## 2023-05-28 MED ORDER — CHLORHEXIDINE GLUCONATE 0.12 % MT SOLN
OROMUCOSAL | Status: AC
  Filled 2023-05-28: qty 15

## 2023-05-28 MED ORDER — ONDANSETRON HCL 4 MG/2ML IJ SOLN
INTRAMUSCULAR | Status: DC | PRN
  Administered 2023-05-28: 4 mg via INTRAVENOUS

## 2023-05-28 MED ORDER — IBUPROFEN 800 MG PO TABS: 800.0000 mg | ORAL_TABLET | Freq: Three times a day (TID) | ORAL | 0 refills | Status: DC

## 2023-05-28 MED ORDER — DROPERIDOL 2.5 MG/ML IJ SOLN: 0.6250 mg | Freq: Once | INTRAMUSCULAR | Status: DC | PRN

## 2023-05-28 MED ORDER — SODIUM CHLORIDE 0.9 % IV SOLN: INTRAVENOUS | Status: DC

## 2023-05-28 MED ORDER — OXYCODONE HCL 5 MG/5ML PO SOLN: 5.0000 mg | Freq: Once | ORAL | Status: DC | PRN

## 2023-05-28 MED ORDER — ACETAMINOPHEN 10 MG/ML IV SOLN: 1000.0000 mg | Freq: Once | INTRAVENOUS | Status: DC | PRN

## 2023-05-28 MED ORDER — KETOROLAC TROMETHAMINE 30 MG/ML IJ SOLN
INTRAMUSCULAR | Status: AC
  Filled 2023-05-28: qty 1

## 2023-05-28 MED ORDER — LIDOCAINE HCL (CARDIAC) PF 100 MG/5ML IV SOSY
PREFILLED_SYRINGE | INTRAVENOUS | Status: DC | PRN
  Administered 2023-05-28: 50 mg via INTRAVENOUS

## 2023-05-28 MED ORDER — MIDAZOLAM HCL 2 MG/2ML IJ SOLN
INTRAMUSCULAR | Status: AC
  Filled 2023-05-28: qty 2

## 2023-05-28 MED ORDER — ONDANSETRON HCL 4 MG PO TABS: 4.0000 mg | ORAL_TABLET | Freq: Four times a day (QID) | ORAL | Status: DC | PRN

## 2023-05-28 MED ORDER — ORAL CARE MOUTH RINSE: 15.0000 mL | Freq: Once | OROMUCOSAL | Status: AC

## 2023-05-28 MED ORDER — CHLORHEXIDINE GLUCONATE 0.12 % MT SOLN
15.0000 mL | Freq: Once | OROMUCOSAL | Status: AC
  Administered 2023-05-28: 15 mL via OROMUCOSAL

## 2023-05-28 MED ORDER — METOCLOPRAMIDE HCL 10 MG PO TABS: 5.0000 mg | ORAL_TABLET | Freq: Three times a day (TID) | ORAL | Status: DC | PRN

## 2023-05-28 MED ORDER — FENTANYL CITRATE (PF) 100 MCG/2ML IJ SOLN
INTRAMUSCULAR | Status: AC
  Filled 2023-05-28: qty 2

## 2023-05-28 MED ORDER — 0.9 % SODIUM CHLORIDE (POUR BTL) OPTIME
TOPICAL | Status: DC | PRN
Start: 2023-05-28 — End: 2023-05-28
  Administered 2023-05-28: 500 mL

## 2023-05-28 MED ORDER — FENTANYL CITRATE (PF) 100 MCG/2ML IJ SOLN
INTRAMUSCULAR | Status: DC | PRN
  Administered 2023-05-28 (×4): 25 ug via INTRAVENOUS

## 2023-05-28 MED ORDER — LACTATED RINGERS IV SOLN: INTRAVENOUS | Status: DC

## 2023-05-28 MED ORDER — BUPIVACAINE HCL (PF) 0.5 % IJ SOLN
INTRAMUSCULAR | Status: AC
  Filled 2023-05-28: qty 30

## 2023-05-28 MED ORDER — ACETAMINOPHEN 325 MG PO TABS: 325.0000 mg | ORAL_TABLET | Freq: Four times a day (QID) | ORAL | Status: DC | PRN

## 2023-05-28 MED ORDER — OXYCODONE HCL 5 MG PO TABS: 5.0000 mg | ORAL_TABLET | Freq: Once | ORAL | Status: DC | PRN

## 2023-05-28 MED ORDER — CEFAZOLIN SODIUM-DEXTROSE 2-4 GM/100ML-% IV SOLN
INTRAVENOUS | Status: AC
  Filled 2023-05-28: qty 100

## 2023-05-28 MED ORDER — KETOROLAC TROMETHAMINE 30 MG/ML IJ SOLN
30.0000 mg | Freq: Once | INTRAMUSCULAR | Status: AC
  Administered 2023-05-28: 30 mg via INTRAVENOUS

## 2023-05-28 MED ORDER — MIDAZOLAM HCL 2 MG/2ML IJ SOLN
INTRAMUSCULAR | Status: DC | PRN
  Administered 2023-05-28: 2 mg via INTRAVENOUS

## 2023-05-28 MED ORDER — LIDOCAINE HCL (PF) 1 % IJ SOLN
INTRAMUSCULAR | Status: AC
  Filled 2023-05-28: qty 30

## 2023-05-28 SURGICAL SUPPLY — 29 items
BNDG COHESIVE 4X5 TAN STRL LF (GAUZE/BANDAGES/DRESSINGS) ×2 IMPLANT
BNDG ELASTIC 2INX 5YD STR LF (GAUZE/BANDAGES/DRESSINGS) ×2 IMPLANT
BNDG ESMARCH 4X12 STRL LF (GAUZE/BANDAGES/DRESSINGS) ×2 IMPLANT
BNDG STRETCH GAUZE 3IN X12FT (GAUZE/BANDAGES/DRESSINGS) ×2 IMPLANT
CHLORAPREP W/TINT 26 (MISCELLANEOUS) ×2 IMPLANT
CORD BIP STRL DISP 12FT (MISCELLANEOUS) ×2 IMPLANT
CUFF TRNQT CYL 24X4X16.5-23 (TOURNIQUET CUFF) IMPLANT
DRAPE SURG 17X11 SM STRL (DRAPES) ×2 IMPLANT
FORCEPS JEWEL BIP 4-3/4 STR (INSTRUMENTS) ×2 IMPLANT
GAUZE SPONGE 4X4 12PLY STRL (GAUZE/BANDAGES/DRESSINGS) ×2 IMPLANT
GAUZE XEROFORM 1X8 LF (GAUZE/BANDAGES/DRESSINGS) ×2 IMPLANT
GLOVE BIO SURGEON STRL SZ8 (GLOVE) ×4 IMPLANT
GLOVE BIOGEL PI IND STRL 8 (GLOVE) ×2 IMPLANT
GLOVE INDICATOR 8.0 STRL GRN (GLOVE) ×2 IMPLANT
GOWN STRL REUS W/ TWL LRG LVL3 (GOWN DISPOSABLE) ×2 IMPLANT
GOWN STRL REUS W/ TWL XL LVL3 (GOWN DISPOSABLE) ×2 IMPLANT
KIT ESCP INSRT D SLOT CANN KN (MISCELLANEOUS) ×2 IMPLANT
KIT TURNOVER KIT A (KITS) ×2 IMPLANT
MANIFOLD NEPTUNE II (INSTRUMENTS) ×2 IMPLANT
NDL HYPO 25X1 1.5 SAFETY (NEEDLE) ×2 IMPLANT
NEEDLE HYPO 25X1 1.5 SAFETY (NEEDLE) ×2 IMPLANT
NS IRRIG 500ML POUR BTL (IV SOLUTION) ×2 IMPLANT
PACK EXTREMITY ARMC (MISCELLANEOUS) ×2 IMPLANT
SPLINT WRIST XL RT TX990305 (SOFTGOODS) IMPLANT
STOCKINETTE IMPERV 14X48 (MISCELLANEOUS) ×2 IMPLANT
STOCKINETTE IMPERVIOUS 9X36 MD (GAUZE/BANDAGES/DRESSINGS) ×2 IMPLANT
SUT PROLENE 4 0 PS 2 18 (SUTURE) ×2 IMPLANT
SYR 10ML LL (SYRINGE) ×2 IMPLANT
TRAP FLUID SMOKE EVACUATOR (MISCELLANEOUS) ×2 IMPLANT

## 2023-05-28 NOTE — Anesthesia Preprocedure Evaluation (Addendum)
 Anesthesia Evaluation  Patient identified by MRN, date of birth, ID band Patient awake    Reviewed: Allergy & Precautions, H&P , NPO status , Patient's Chart, lab work & pertinent test results  Airway Mallampati: III  TM Distance: >3 FB Neck ROM: full    Dental no notable dental hx.    Pulmonary neg pulmonary ROS   Pulmonary exam normal        Cardiovascular hypertension, Normal cardiovascular exam     Neuro/Psych  PSYCHIATRIC DISORDERS  Depression    negative neurological ROS     GI/Hepatic negative GI ROS, Neg liver ROS,,,  Endo/Other    Class 3 obesity  Renal/GU negative Renal ROS  negative genitourinary   Musculoskeletal  (+) Arthritis ,    Abdominal  (+) + obese  Peds  Hematology negative hematology ROS (+)   Anesthesia Other Findings Past Medical History: No date: Arthritis No date: Chronic back pain No date: History of bronchitis No date: Hypertension No date: Pre-diabetes  Past Surgical History: No date: BOWEL RESECTION     Comment:  patient had some muscle removed  No date: CHOLECYSTECTOMY 03/11/2022: COLONOSCOPY WITH PROPOFOL ; N/A     Comment:  Procedure: COLONOSCOPY WITH PROPOFOL ;  Surgeon:               Shane Darling, MD;  Location: [facility] ENDOSCOPY;                Service: Endoscopy;  Laterality: N/A; No date: EYE SURGERY     Comment:  lasix surgery 08/24/2015: LUMBAR LAMINECTOMY/DECOMPRESSION MICRODISCECTOMY; N/A     Comment:  Procedure: Thoracic nine- ten, Thoracic ten- eleven               Laminectomy;  Surgeon: Garry Kansas, MD;  Location: [facility]              NEURO ORS;  Service: Neurosurgery;  Laterality: N/A;                T9-10, T10-11 Laminectomy No date: TONSILLECTOMY     Reproductive/Obstetrics negative OB ROS                             Anesthesia Physical Anesthesia Plan  ASA: 3  Anesthesia Plan: General   Post-op Pain Management:     Induction: Intravenous  PONV Risk Score and Plan:   Airway Management Planned: LMA and Oral ETT  Additional Equipment:   Intra-op Plan:   Post-operative Plan: Extubation in OR  Informed Consent: I have reviewed the patients History and Physical, chart, labs and discussed the procedure including the risks, benefits and alternatives for the proposed anesthesia with the patient or authorized representative who has indicated his/her understanding and acceptance.     Dental Advisory Given  Plan Discussed with: CRNA and Surgeon  Anesthesia Plan Comments:         Anesthesia Quick Evaluation

## 2023-05-28 NOTE — Anesthesia Procedure Notes (Signed)
 Procedure Name: LMA Insertion Date/Time: 05/28/2023 9:00 AM  Performed by: Bill Budd, CRNAPre-anesthesia Checklist: Patient identified, Patient being monitored, Timeout performed, Emergency Drugs available and Suction available Patient Re-evaluated:Patient Re-evaluated prior to induction Oxygen Delivery Method: Circle system utilized Preoxygenation: Pre-oxygenation with 100% oxygen Induction Type: IV induction Ventilation: Mask ventilation without difficulty LMA: LMA inserted LMA Size: 4.0 Tube type: Oral Number of attempts: 1 Placement Confirmation: positive ETCO2 and breath sounds checked- equal and bilateral Tube secured with: Tape Dental Injury: Teeth and Oropharynx as per pre-operative assessment

## 2023-05-28 NOTE — H&P (Signed)
 History of Present Illness: Taylor Valencia is a 53 y.o. female who presents today for her surgical history and physical for upcoming right endoscopic carpal tunnel release in addition to trigger finger release involving the right long finger. Surgery is scheduled with Dr. Daun Epstein on 05/28/2023. The patient denies any changes in her medical history since she was last evaluated. She denies any trauma or injury affecting the right wrist or hand since her last evaluation. She does continue to report catching and locking involving the right long finger and a sharp pain especially at night that will wake her up. She reports a 4 out of 10 pain score at today's visit. She continues report ongoing numbness and ting of the right hand. She denies any personal history of heart attack, stroke, asthma or COPD. Denies any history of blood clots. Denies any history of diabetes.  Past Medical History: Adult BMI 45.0-49.9 kg/sq m (CMS/HHS-HCC) 05/24/2015  Benign essential hypertension 07/28/2014  Chickenpox  Essential hypertension 09/15/2014  Hypertension  Seasonal allergies  Vitamin D deficiency 08/08/2016   Past Surgical History: Colon @ [facility] 03/11/2022 (Polyp was benign. 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  B-complex with vitamin C (VITAMIN B COMPLEX-C ORAL) Take 1 tablet by mouth once daily  calcium carbonate-vitamin D3 (CALTRATE 600+D) 600 mg(1,500mg ) -200 unit tablet Take 1 tablet by mouth at bedtime  hydroCHLOROthiazide  (HYDRODIURIL ) 12.5 MG tablet  TAKE 1 TABLET(12.5 MG) BY MOUTH EVERY DAY 90 tablet 0  ketoconazole (NIZORAL) 2 % cream Apply topically once daily 30 g 3  lisinopriL  (ZESTRIL ) 10 MG tablet TAKE 1 TABLET(10 MG) BY MOUTH EVERY DAY 90 tablet 1  magnesium oxide (MAG-OX) 400 mg (241.3 mg magnesium) tablet TAKE 1 TABLET(400 MG) BY MOUTH EVERY DAY 30 tablet 5  meloxicam  (MOBIC ) 15 MG tablet Take 1 tablet (15 mg total) by mouth once daily as needed for Pain 90 tablet 1  POTASSIUM ORAL Take 1 tablet by mouth once daily   Allergies: No Known Allergies   Review of Systems:  A comprehensive 14 point ROS was performed, reviewed by me today, and the pertinent orthopaedic findings are documented in the HPI.  Physical Exam: BP 130/80  Ht 152.4 cm (5')  Wt (!) 115.2 kg (254 lb)  BMI 49.61 kg/m  General/Constitutional: The patient appears to be well-nourished, well-developed, and in no acute distress. Neuro/Psych: Normal mood and affect, oriented to person, place and time. Eyes: Non-icteric. Pupils are equal, round, and reactive to light, and exhibit synchronous movement. ENT: Unremarkable. Lymphatic: No palpable adenopathy. Respiratory: Lungs clear to auscultation, Normal chest excursion, No wheezes, and Non-labored breathing Cardiovascular: Regular rate and rhythm. No murmurs. and No edema, swelling or tenderness, except as noted in detailed exam. Integumentary: No impressive skin lesions present, except as noted in detailed exam. Musculoskeletal: Unremarkable, except as noted in detailed exam.  Right wrist/hand exam: Skin inspection of the right wrist and hand again is unremarkable. No swelling, erythema, ecchymosis, abrasions, or other skin abnormalities are identified. There is mild-moderate tenderness to palpation over the volar aspect of the right long finger at the level of the metacarpal head. Otherwise,  she denies any other areas of tenderness around the dorsal or volar aspects of the wrist, nor does she have any areas of  discomfort over the dorsal or palmar aspects of the hand. She exhibits full active and passive range of motion of the wrist without any pain or catching. She can actively flex and extend all digits fully, but has painful catching of the right long finger, consistent with a trigger finger. She is neurovascularly intact to all digits. She demonstrates a positive Phalen's test, but has a negative Tinel's over the carpal tunnel.  EMG/NCS (01/16/22):  Abnormal study. There is electrodiagnostic evidence of chronic, moderate right carpal tunnel syndrome.   Impression: Carpal tunnel syndrome, right [G56.01] Carpal tunnel syndrome, right (primary encounter diagnosis) Trigger middle finger of right hand  Plan:  1. Treatment options were discussed today with the patient. 2. The patient is scheduled for a right endoscopic carpal tunnel release in addition to trigger finger release involving the right long finger. Surgery scheduled with Dr. Daun Epstein on 05/28/2023. 3. The patient was instructed on the risk and benefits of surgical intervention and wishes to proceed at this time. 4. This document will serve as a surgical history and physical for the patient. 5. The patient will follow-up per standard postop protocol. They can call the clinic they have any questions, new symptoms develop or symptoms worsen.  The procedure was discussed with the patient, as were the potential risks (including bleeding, infection, nerve and/or blood vessel injury, persistent or recurrent pain, failure of the release, need for further surgery, blood clots, strokes, heart attacks and/or arhythmias, pneumonia, etc.) and benefits. The patient states her understanding and wishes to proceed.    H&P reviewed and patient re-examined. No changes.

## 2023-05-28 NOTE — Discharge Instructions (Addendum)
 Orthopedic discharge instructions: Keep dressing dry and intact. Keep hand elevated above heart level. May shower after dressing removed on postop day 4 (Sunday). Cover sutures with Band-Aids after drying off, then reapply Velcro splint. Apply ice to affected area frequently. Take ibuprofen 800 mg TID with meals for 3-5 days, then as necessary. Take ES Tylenol  if/when needed.  Return for follow-up in 10-14 days or as scheduled.

## 2023-05-28 NOTE — Op Note (Signed)
 05/28/2023  10:03 AM  Patient:   Taylor Valencia  Pre-Op Diagnosis:   1. Right carpal tunnel syndrome.  2. Right long trigger finger.  Post-Op Diagnosis:   Same.  Procedure:   1. Endoscopic right carpal tunnel release.  2. Release right long trigger finger.  Surgeon:   Lonnie Roberts, MD  Anesthesia:   General LMA  Findings:   As above.  Complications:   None  EBL:   0 cc  Fluids:   500 cc crystalloid  TT:   21 minutes at 250 mmHg  Drains:   None  Closure:   4-0 Prolene interrupted sutures  Brief Clinical Note:   The patient is a 53 year old female who with a history of progressively worsening pain and paresthesias to her right hand. Her symptoms have progressed despite medications, activity modification, etc. Her history and examination consistent with carpal tunnel syndrome, confirmed by EMG. The patient presents at this time for an endoscopic right carpal tunnel release.   The patient also complains of recurrent painful catching of her right long finger. These symptoms have persisted despite medications, activity modification, etc. Her history and examination are consistent with a painful right long trigger finger. She presents at this time for release of her right long trigger finger.  Procedure:   The patient was brought into the operating room and lain in the supine position. After adequate general laryngeal mask anesthesia was obtained, the right hand and upper extremity were prepped with ChloraPrep solution before being draped sterilely. Preoperative antibiotics were administered. A timeout was performed to verify the appropriate surgical site before the limb was exsanguinated with an Esmarch and the tourniquet inflated to 250 mmHg.   An approximately 1.5-2 cm incision was made over the volar wrist flexion crease, centered over the palmaris longus tendon. The incision was carried down through the subcutaneous tissues with care taken to identify and protect any  neurovascular structures. The distal forearm fascia was penetrated just proximal to the transverse carpal ligament. The soft tissues were released off the superficial and deep surfaces of the distal forearm fascia and this was released proximally for 3-4 cm under direct visualization.  Attention was directed distally. The Therapist, nutritional was passed beneath the transverse carpal ligament along the ulnar aspect of the carpal tunnel and used to release any adhesions as well as to remove any adherent synovial tissue before first the smaller then the larger of the two dilators were passed beneath the transverse carpal ligament along the ulnar margin of the carpal tunnel. The slotted cannula was introduced and the endoscope was placed into the slotted cannula and the undersurface of the transverse carpal ligament visualized. The distal margin of the transverse carpal ligament was marked by placing a 25-gauge needle percutaneously at Kaplan's cardinal point so that it entered the distal portion of the slotted cannula. Under endoscopic visualization, the transverse carpal ligament was released from proximal to distal using the end-cutting blade. A second pass was performed to ensure complete release of the ligament. The adequacy of release was verified both endoscopically and by palpation using the freer elevator.  Next, the right long trigger finger was addressed.  Utilizing the existing flexor crease, an approximately 1.5-2.0 cm incision was made over the volar aspect of the right long finger at the level of the metacarpal head centered over the flexor sheath. The incision was carried down through the subcutaneous tissues with care taken to identify and protect the digital neurovascular structures. The flexor sheath was  entered just proximal to the A1 pulley. The sheath was released proximally for several centimeters under direct visualization. Distally, a clamp was placed beneath the A1 pulley and used to release any  adhesions. The clamp was repositioned so that one jaw was superficial to and the other jaw deep to the A1 pulley. The A1 pulley was incised on either side of the clamp to remove a 2 mm strip of tissue. Metzenbaum scissors were used to ensure complete release of the A1 pulley more distally. The underlying tendons were carefully inspected and found to be intact.  Each wound was irrigated thoroughly with sterile saline solution before being closed using 4-0 Prolene interrupted sutures. A total of 15 cc of 0.5% plain Sensorcaine  was injected in and around the incisions before a sterile bulky dressing was applied to the wound. The patient was placed into a volar wrist splint before being awakened, extubated, and returned to the recovery room in satisfactory condition after tolerating the procedure well.

## 2023-05-28 NOTE — Transfer of Care (Signed)
 Immediate Anesthesia Transfer of Care Note  Patient: Taylor Valencia  Procedure(s) Performed: RELEASE, CARPAL TUNNEL, ENDOSCOPIC (Right: Wrist) RELEASE, A1 PULLEY, FOR TRIGGER FINGER (Right: Middle Finger)  Patient Location: PACU  Anesthesia Type:General  Level of Consciousness: drowsy  Airway & Oxygen Therapy: Patient Spontanous Breathing and Patient connected to face mask oxygen  Post-op Assessment: Report given to RN and Post -op Vital signs reviewed and stable  Post vital signs: Reviewed and stable  Last Vitals:  Vitals Value Taken Time  BP 132/69 05/28/23 0945  Temp    Pulse 72 05/28/23 0948  Resp 19 05/28/23 0948  SpO2 100 % 05/28/23 0948  Vitals shown include unfiled device data.  Last Pain:  Vitals:   05/28/23 0808  TempSrc: Temporal  PainSc: 0-No pain      Patients Stated Pain Goal: 0 (05/28/23 7829)  Complications: No notable events documented.

## 2023-05-28 NOTE — Anesthesia Postprocedure Evaluation (Signed)
 Anesthesia Post Note  Patient: Taylor Valencia  Procedure(s) Performed: RELEASE, CARPAL TUNNEL, ENDOSCOPIC (Right: Wrist) RELEASE, A1 PULLEY, FOR TRIGGER FINGER (Right: Middle Finger)  Patient location during evaluation: PACU Anesthesia Type: General Level of consciousness: awake and alert Pain management: pain level controlled Vital Signs Assessment: post-procedure vital signs reviewed and stable Respiratory status: spontaneous breathing, nonlabored ventilation and respiratory function stable Cardiovascular status: blood pressure returned to baseline and stable Postop Assessment: no apparent nausea or vomiting Anesthetic complications: no   No notable events documented.   Last Vitals:  Vitals:   05/28/23 1015 05/28/23 1036  BP: 112/63 130/75  Pulse: 79 80  Resp: 16 18  Temp: (!) 36.1 C (!) 36.3 C  SpO2: 97% 97%    Last Pain:  Vitals:   05/28/23 1036  TempSrc: Temporal  PainSc: 0-No pain                 Baltazar Bonier

## 2023-05-29 ENCOUNTER — Encounter: Payer: Self-pay | Admitting: Surgery

## 2023-06-13 NOTE — Therapy (Signed)
 OUTPATIENT OCCUPATIONAL THERAPY ORTHO EVALUATION  Patient Name: Taylor Valencia MRN: [id number] DOB:[birth year], 53 y.o., female Today's Date: 06/16/2023  PCP: Dr Isadore Marble PROVIDER: Darleene Ege PA  END OF SESSION:  OT End of Session - 06/16/23 1608     Visit Number 1    Number of Visits 12    Date for OT Re-Evaluation 08/11/23    OT Start Time 1604    OT Stop Time 1655    OT Time Calculation (min) 51 min    Activity Tolerance Patient tolerated treatment well    Behavior During Therapy WFL for tasks assessed/performed             Past Medical History:  Diagnosis Date   Arthritis    Chronic back pain    History of bronchitis    Hypertension    Pre-diabetes    Past Surgical History:  Procedure Laterality Date   BOWEL RESECTION     patient had some muscle removed    CARPAL TUNNEL RELEASE Right 05/28/2023   Procedure: RELEASE, CARPAL TUNNEL, ENDOSCOPIC;  Surgeon: Elner Hahn, MD;  Location: [facility];  Service: Orthopedics;  Laterality: Right;   CHOLECYSTECTOMY     COLONOSCOPY WITH PROPOFOL  N/A 03/11/2022   Procedure: COLONOSCOPY WITH PROPOFOL ;  Surgeon: Shane Darling, MD;  Location: [facility];  Service: Endoscopy;  Laterality: N/A;   EYE SURGERY     lasix surgery   LUMBAR LAMINECTOMY/DECOMPRESSION MICRODISCECTOMY N/A 08/24/2015   Procedure: Thoracic nine- ten, Thoracic ten- eleven Laminectomy;  Surgeon: Garry Kansas, MD;  Location: [facility];  Service: Neurosurgery;  Laterality: N/A;  T9-10, T10-11 Laminectomy   TONSILLECTOMY     TRIGGER FINGER RELEASE Right 05/28/2023   Procedure: RELEASE, A1 PULLEY, FOR TRIGGER FINGER;  Surgeon: Elner Hahn, MD;  Location: [facility] OR[facility];  Service: Orthopedics;  Laterality: Right;   Patient Active Problem List   Diagnosis Date Noted   Essential hypertension 12/22/2018   Morbid obesity (HCC) 12/22/2018   Depression 12/22/2018   Thoracic myelopathy 08/24/2015    ONSET DATE: 05/28/23  REFERRING DIAG: R CTS  and 3rd trigger finger release  THERAPY DIAG:  Stiffness of right hand, not elsewhere classified  Stiffness of right wrist, not elsewhere classified  Localized edema  Pain in right hand  Muscle weakness (generalized)  Scar condition and fibrosis of skin  Rationale for Evaluation and Treatment: Rehabilitation  SUBJECTIVE:   SUBJECTIVE STATEMENT: Patient report her Steri-Strips came off and her incision started opening since last Monday.  With her doing some of the exercises that was showed at the doctor's office with opening her hand.  Patient arrived with a Band-Aid. Pt accompanied by: self  PERTINENT HISTORY: Impression: Carpal tunnel syndrome, right [G56.01] Carpal tunnel syndrome, right (primary encounter diagnosis) Trigger middle finger of right hand  Plan:  1. Treatment options were discussed today with the patient. 2. Sutures removed at today's visit, benzoin and Steri-Strips were applied. The patient was instructed that she may begin the shower at this time. 3. She was given hand exercises to perform at home at this time. She was instructed to begin massaging the incision sites later this week to decrease chances of scar tissue development. 4. The patient was instructed to continue to wear the Velcro wrist splint for 1 more week at night but she does not need to wear it during today. Gradually increase activities as tolerated to the right hand. 5. Given her limitations with flexion at today's visit, a referral for formal  occupational therapy was placed to the patient. A work note was provided keeping the patient out until her next follow-up appointment. 6. The patient will follow-up in 1 month with Dr. Daun Epstein for repeat evaluation of the right hand. They can call the clinic they have any questions, new symptoms develop or symptoms worsen.   PRECAUTIONS: None     WEIGHT BEARING RESTRICTIONS: No  PAIN:  Are you having pain?   FALLS: Has patient fallen in last 6  months? 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  In her free time doing things around the house  PATIENT GOALS: Get the scar better as well as her motion and strength to be able to do her job and things around the house  NEXT MD VISIT: 07/11/23  OBJECTIVE:  Note: Objective measures were completed at Evaluation unless otherwise noted.  HAND DOMINANCE: Right  ADLs: Patient limited in use of right dominant hand because of incision not closed and guarding as well as increased edema and decreased motion and increased pain   FUNCTIONAL OUTCOME MEASURES: Next session   UPPER EXTREMITY ROM:     Active ROM Right eval Left eval  Shoulder flexion    Shoulder abduction    Shoulder adduction    Shoulder extension    Shoulder internal rotation    Shoulder external rotation    Elbow flexion    Elbow extension    Wrist flexion 40    Wrist extension 70 digits bend   Wrist ulnar deviation    Wrist radial deviation    Wrist pronation    Wrist supination    (Blank rows = not tested)  Active ROM Right eval Left eval  Thumb MCP (0-60)    Thumb IP (0-80)    Thumb Radial abd/add (0-55) 42    Thumb Palmar abd/add (0-45) 50    Thumb Opposition to Small Finger     Index MCP (0-90) 50    Index PIP (0-100) 70    Index DIP (0-70)      Long MCP (0-90) 45     Long PIP (0-100)  90    Long DIP (0-70)      Ring MCP (0-90) 60     Ring PIP (0-100) 80     Ring DIP (0-70)      Little MCP (0-90) 60     Little PIP (0-100) 80     Little DIP (0-70)      (Blank rows = not tested)   HAND FUNCTION: NT Incision open still - Grip strength: Right:   lbs; Left:   lbs, Lateral pinch: Right:   lbs, Left:   lbs, and 3 point pinch: Right:   lbs, Left:   lbs  COORDINATION: Impaired because of increased swelling and pain in stiffness  SENSATION: Numbness in 2nd, 3rd and 4th digits with third  throwers  EDEMA: edema on the right hand  COGNITION: Overall cognitive status: Within functional limits for tasks assessed       TREATMENT DATE: 06/16/23  Modalities: Recommend for patient to do contrast using hot and cold pack alternating prior to home exercises to decrease edema and decrease stiffness.  2 minutes heat 1 minute ice alternating 3 times  Patient reports coming in with incision opening with attempts of doing her home exercises after stitches was removed.  Patient has Band-Aid in place. This date applied some Steri-Strips.  With a Band-Aid. Patient to contact doctors office if not staying in place.  After contrast 3 times a day patient to do wrist flexion extension with a loose fist 10 reps Followed by thumb palmar radial abduction 12 reps followed by gentle MCP flexion with gentle extension 10 reps Intrinsic if is blocked 12 reps Like composite flexion and opening not putting too much stretch on the incision until healed or until seeing orthopedics.  Tubigrip D applied over right hand. If not incision is not closing and Steri-Strips not staying in place patient to contact orthopedics in the next 24 to 48 hours    PATIENT EDUCATION: Education details: findings of eval and HEP  Person educated: Patient Education method: Explanation, Demonstration, Tactile cues, Verbal cues, and Handouts Education comprehension: verbalized understanding, returned demonstration, verbal cues required, and needs further education    GOALS: Goals reviewed with patient? Yes   LONG TERM GOALS: Target date: 8 wks  Patient be independent in home program to decrease edema and pain for patient to increase digit flexion to touching palm. Baseline: Incision not closed.  Increased edema and pain increased to 8-10/10 in the third digit; MC flexion 45 to 60 degrees and  PIP 70-90 Goal status: INITIAL  2.  Wrist flexion extension on the right improved to within normal limits for patient to push and pull heavy door as well as turn doorknob without increased symptoms Baseline: Wrist flexion is 40 degrees and extension 70 with loose fist patient not using hand because of incision not closed Goal status: INITIAL  3.  Incisional right hand improved for patient to initiate scar massage as well as being able to tolerate different textures including tapping and clapping hands with no increase symptoms Baseline: Patient report incision not closed since last time.  Steri-Strips came off and incision opening with digit extension.  Steri-Strips applied if not staying in place patient to contact orthopedics in the next 24 to 48 hours.  Keep home exercises without opening or putting stress on incision Goal status: INITIAL  4.  Right grip and prehension strength improve to more than 75% compared to the left for patient to return to using hand normally at home and cooking and laundry and cleaning activities without increase symptoms Baseline: Patient is about 3 weeks postop.  Patient with increased edema and pain and stiffness.  Incision not closed-not tested Goal status: INITIAL   ASSESSMENT:  CLINICAL IMPRESSION: Patient seen today for occupational therapy evaluation for right arthroscopic carpal tunnel release and third digit trigger finger release by Dr. Daun Epstein -patient had surgery 05/28/2023.  Patient arrive in clinic with increased edema and pain and stiffness in the right hand.  With reports of incision not closed yet.  Opened with exercises provided and done since stitches was removed.  Patient with decreased range of motion in wrist as well as digits.  Steri-Strips was applied.  If not staying in place patient to contact orthopedics in the next 24 to 48 hours to be seen.  Patient can do gentle tendon glides as well as wrist flexion extension without putting stress on the  incision.  Also  provided with thumb palmar radial abduction.  Patient limited in functional use of right dominant hand in ADLs and IADLs.  Patient can benefit from skilled OT services to decrease edema, pain, scar tissue and increased motion and strength to return to prior level of function  PERFORMANCE DEFICITS: in functional skills including ADLs, IADLs, ROM, strength, pain, flexibility, decreased knowledge of use of DME, and UE functional use,   and psychosocial skills including environmental adaptation and routines and behaviors.   IMPAIRMENTS: are limiting patient from ADLs, IADLs, rest and sleep, play, leisure, and social participation.   COMORBIDITIES: has no other co-morbidities that affects occupational performance. Patient will benefit from skilled OT to address above impairments and improve overall function.  MODIFICATION OR ASSISTANCE TO COMPLETE EVALUATION: No modification of tasks or assist necessary to complete an evaluation.  OT OCCUPATIONAL PROFILE AND HISTORY: Problem focused assessment: Including review of records relating to presenting problem.  CLINICAL DECISION MAKING: LOW - limited treatment options, no task modification necessary  REHAB POTENTIAL: Good for goals  EVALUATION COMPLEXITY: Low    PLAN:  OT FREQUENCY: 1-2x/week  OT DURATION: 8 weeks  PLANNED INTERVENTIONS: 97168 OT Re-evaluation, 97535 self care/ADL training, 82956 therapeutic exercise, 97530 therapeutic activity, 97140 manual therapy, 97018 paraffin, 21308 fluidotherapy, 97034 contrast bath, scar mobilization, passive range of motion, patient/family education, and DME and/or AE instructions   CONSULTED AND AGREED WITH PLAN OF CARE: Patient     Heloise Lobo, OTR/L,CLT 06/16/2023, 6:11 PM

## 2023-06-16 ENCOUNTER — Ambulatory Visit: Attending: Student | Admitting: Occupational Therapy

## 2023-06-16 ENCOUNTER — Encounter: Payer: Self-pay | Admitting: Occupational Therapy

## 2023-06-16 DIAGNOSIS — R600 Localized edema: Secondary | ICD-10-CM | POA: Insufficient documentation

## 2023-06-16 DIAGNOSIS — M25631 Stiffness of right wrist, not elsewhere classified: Secondary | ICD-10-CM | POA: Diagnosis present

## 2023-06-16 DIAGNOSIS — M25641 Stiffness of right hand, not elsewhere classified: Secondary | ICD-10-CM | POA: Insufficient documentation

## 2023-06-16 DIAGNOSIS — M6281 Muscle weakness (generalized): Secondary | ICD-10-CM | POA: Insufficient documentation

## 2023-06-16 DIAGNOSIS — M79641 Pain in right hand: Secondary | ICD-10-CM | POA: Diagnosis present

## 2023-06-16 DIAGNOSIS — L905 Scar conditions and fibrosis of skin: Secondary | ICD-10-CM | POA: Insufficient documentation

## 2023-06-18 ENCOUNTER — Ambulatory Visit: Admitting: Occupational Therapy

## 2023-06-18 DIAGNOSIS — R600 Localized edema: Secondary | ICD-10-CM

## 2023-06-18 DIAGNOSIS — M79641 Pain in right hand: Secondary | ICD-10-CM

## 2023-06-18 DIAGNOSIS — M25641 Stiffness of right hand, not elsewhere classified: Secondary | ICD-10-CM | POA: Diagnosis not present

## 2023-06-18 DIAGNOSIS — L905 Scar conditions and fibrosis of skin: Secondary | ICD-10-CM

## 2023-06-18 DIAGNOSIS — M25631 Stiffness of right wrist, not elsewhere classified: Secondary | ICD-10-CM

## 2023-06-18 DIAGNOSIS — M6281 Muscle weakness (generalized): Secondary | ICD-10-CM

## 2023-06-18 NOTE — Therapy (Signed)
 OUTPATIENT OCCUPATIONAL THERAPY ORTHO TREATMENT  Patient Name: Taylor Valencia MRN: [id number] DOB:Oct 02, 1970, 53 y.o., female Today's Date: 06/18/2023  PCP: Dr Isadore Marble PROVIDER: Darleene Ege PA  END OF SESSION:  OT End of Session - 06/18/23 1331     Visit Number 2    Number of Visits 12    Date for OT Re-Evaluation 08/11/23    OT Start Time 1315    Activity Tolerance Patient tolerated treatment well    Behavior During Therapy [facility] for tasks assessed/performed             Past Medical History:  Diagnosis Date   Arthritis    Chronic back pain    History of bronchitis    Hypertension    Pre-diabetes    Past Surgical History:  Procedure Laterality Date   BOWEL RESECTION     patient had some muscle removed    CARPAL TUNNEL RELEASE Right 05/28/2023   Procedure: RELEASE, CARPAL TUNNEL, ENDOSCOPIC;  Surgeon: Elner Hahn, MD;  Location: [facility];  Service: Orthopedics;  Laterality: Right;   CHOLECYSTECTOMY     COLONOSCOPY WITH PROPOFOL  N/A 03/11/2022   Procedure: COLONOSCOPY WITH PROPOFOL ;  Surgeon: Shane Darling, MD;  Location: [facility];  Service: Endoscopy;  Laterality: N/A;   EYE SURGERY     lasix surgery   LUMBAR LAMINECTOMY/DECOMPRESSION MICRODISCECTOMY N/A 08/24/2015   Procedure: Thoracic nine- ten, Thoracic ten- eleven Laminectomy;  Surgeon: Garry Kansas, MD;  Location: [facility];  Service: Neurosurgery;  Laterality: N/A;  T9-10, T10-11 Laminectomy   TONSILLECTOMY     TRIGGER FINGER RELEASE Right 05/28/2023   Procedure: RELEASE, A1 PULLEY, FOR TRIGGER FINGER;  Surgeon: Elner Hahn, MD;  Location: [facility];  Service: Orthopedics;  Laterality: Right;   Patient Active Problem List   Diagnosis Date Noted   Essential hypertension 12/22/2018   Morbid obesity (HCC) 12/22/2018   Depression 12/22/2018   Thoracic myelopathy 08/24/2015    ONSET DATE: 05/28/23  REFERRING DIAG: R CTS and 3rd trigger finger release  THERAPY DIAG:  Stiffness  of right hand, not elsewhere classified  Localized edema  Muscle weakness (generalized)  Stiffness of right wrist, not elsewhere classified  Pain in right hand  Scar condition and fibrosis of skin  Rationale for Evaluation and Treatment: Rehabilitation  SUBJECTIVE:   SUBJECTIVE STATEMENT: Pt reports the steri strips did not stick well, the ones she purchased, pt still with an opening in the skin, no drainage and tissue underneath healing.  Reports no pain but some "discomfort", numbness in index, MF and ring fingers to the base of the digits.  Reports pain has improved since taking gabapentin .  Pt accompanied by: self  PERTINENT HISTORY: Impression: Carpal tunnel syndrome, right [G56.01] Carpal tunnel syndrome, right (primary encounter diagnosis) Trigger middle finger of right hand  Plan:  1. Treatment options were discussed today with the patient. 2. Sutures removed at today's visit, benzoin and Steri-Strips were applied. The patient was instructed that she may begin the shower at this time. 3. She was given hand exercises to perform at home at this time. She was instructed to begin massaging the incision sites later this week to decrease chances of scar tissue development. 4. The patient was instructed to continue to wear the Velcro wrist splint for 1 more week at night but she does not need to wear it during today. Gradually increase activities as tolerated to the right hand. 5. Given her limitations with flexion at today's visit, a referral for  formal occupational therapy was placed to the patient. A work note was provided keeping the patient out until her next follow-up appointment. 6. The patient will follow-up in 1 month with Dr. Daun Epstein for repeat evaluation of the right hand. They can call the clinic they have any questions, new symptoms develop or symptoms worsen.   PRECAUTIONS: None     WEIGHT BEARING RESTRICTIONS: No  PAIN:  Are you having pain?   FALLS: Has  patient fallen in last 6 months? 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  In her free time doing things around the house  PATIENT GOALS: Get the scar better as well as her motion and strength to be able to do her job and things around the house  NEXT MD VISIT: 07/11/23  OBJECTIVE:  Note: Objective measures were completed at Evaluation unless otherwise noted.  HAND DOMINANCE: Right  ADLs: Patient limited in use of right dominant hand because of incision not closed and guarding as well as increased edema and decreased motion and increased pain   FUNCTIONAL OUTCOME MEASURES: Next session   UPPER EXTREMITY ROM:     Active ROM Right eval Left eval  Shoulder flexion    Shoulder abduction    Shoulder adduction    Shoulder extension    Shoulder internal rotation    Shoulder external rotation    Elbow flexion    Elbow extension    Wrist flexion 40  42  Wrist extension 70 digits bend 66, digits slightly flexed  Wrist ulnar deviation    Wrist radial deviation    Wrist pronation    Wrist supination    (Blank rows = not tested)  Active ROM Right eval Left eval  Thumb MCP (0-60)    Thumb IP (0-80)    Thumb Radial abd/add (0-55) 42    Thumb Palmar abd/add (0-45) 50    Thumb Opposition to Small Finger     Index MCP (0-90) 50    Index PIP (0-100) 70    Index DIP (0-70)      Long MCP (0-90) 45     Long PIP (0-100)  90    Long DIP (0-70)      Ring MCP (0-90) 60     Ring PIP (0-100) 80     Ring DIP (0-70)      Little MCP (0-90) 60     Little PIP (0-100) 80     Little DIP (0-70)      (Blank rows = not tested)   HAND FUNCTION: NT Incision open still - Grip strength: Right:   lbs; Left:   lbs, Lateral pinch: Right:   lbs, Left:   lbs, and 3 point pinch: Right:   lbs, Left:   lbs  COORDINATION: Impaired because of increased swelling and pain in stiffness  SENSATION: Numbness in  2nd, 3rd and 4th digits with third throwers  EDEMA: edema on the right hand  COGNITION: Overall cognitive status: Within functional limits for tasks assessed  TREATMENT DATE: 06/18/23  Contrast:  Use of contrast using hot and cold pack to decrease edema, pain and improve tissue mobility.   2-3 minutes heat 1 minute ice, ending with warm prior to performing exercises.    Pt arrived with new set of steri strips but not wanting to stay in place, like due to location in the palm and arches of the hand.  Had a bandaid over the top as well.  Skin inspected and replaced steri strips which held better this time.  She still has a small spot still open but no drainage and has healing tissue underneath area.  Will follow up with doctor's office is strips do not stay in place and area remains open.   Therapeutic Exercises: Tendon gliding exercises modified and gentle for ROM, 10 reps Wrist flexion/extension with a loose fist 10 reps Thumb palmar radial abduction, 12 reps Gentle MCP flexion with gentle extension 10 reps Intrinsic, blocked 12 reps Composite flexion and opening not putting too much stretch on the incision until healed or until seeing orthopedics.  Issued additional Tubigrip D for right hand Pt to contact MD regarding opening in palm for recheck.  PATIENT EDUCATION: Education details: findings of eval and HEP  Person educated: Patient Education method: Explanation, Demonstration, Tactile cues, Verbal cues, and Handouts Education comprehension: verbalized understanding, returned demonstration, verbal cues required, and needs further education    GOALS: Goals reviewed with patient? Yes   LONG TERM GOALS: Target date: 8 wks  Patient be independent in home program to decrease edema and pain for patient to increase digit flexion to touching palm. Baseline:  Incision not closed.  Increased edema and pain increased to 8-10/10 in the third digit; MC flexion 45 to 60 degrees and PIP 70-90 Goal status: INITIAL  2.  Wrist flexion extension on the right improved to within normal limits for patient to push and pull heavy door as well as turn doorknob without increased symptoms Baseline: Wrist flexion is 40 degrees and extension 70 with loose fist patient not using hand because of incision not closed Goal status: INITIAL  3.  Incisional right hand improved for patient to initiate scar massage as well as being able to tolerate different textures including tapping and clapping hands with no increase symptoms Baseline: Patient report incision not closed since last time.  Steri-Strips came off and incision opening with digit extension.  Steri-Strips applied if not staying in place patient to contact orthopedics in the next 24 to 48 hours.  Keep home exercises without opening or putting stress on incision Goal status: INITIAL  4.  Right grip and prehension strength improve to more than 75% compared to the left for patient to return to using hand normally at home and cooking and laundry and cleaning activities without increase symptoms Baseline: Patient is about 3 weeks postop.  Patient with increased edema and pain and stiffness.  Incision not closed-not tested Goal status: INITIAL   ASSESSMENT:  CLINICAL IMPRESSION: Patient seen for occupational therapy evaluation for right arthroscopic carpal tunnel release and third digit trigger finger release by Dr. Daun Epstein -patient had surgery 05/28/2023.  Pt with new steri strips which do not stick well and incision not closed yet. Patient continues to present with decreased range of motion in wrist as well as digits.  New steri-Strips applied, these staying in place and with greater stick but may loosen due to area they are applied since in palmar arches. Pt to contact MD regarding opening to recheck area.  Patient to  continue  with gentle tendon glides as well as wrist flexion/extension without putting stress on the incision.  Pt to also perform thumb palmar radial abduction.  Otherwise pt is doing well, issued additional tubigrip D to wear over hand to provide light compression for edema control and to cover incision area.  Pt to continue with contrast for edema control but using warm and cold packs. Patient limited in functional use of right dominant hand in ADLs and IADLs.  Patient can benefit from skilled OT services to decrease edema, pain, scar tissue and increased motion and strength to return to prior level of function  PERFORMANCE DEFICITS: in functional skills including ADLs, IADLs, ROM, strength, pain, flexibility, decreased knowledge of use of DME, and UE functional use,   and psychosocial skills including environmental adaptation and routines and behaviors.   IMPAIRMENTS: are limiting patient from ADLs, IADLs, rest and sleep, play, leisure, and social participation.   COMORBIDITIES: has no other co-morbidities that affects occupational performance. Patient will benefit from skilled OT to address above impairments and improve overall function.  MODIFICATION OR ASSISTANCE TO COMPLETE EVALUATION: No modification of tasks or assist necessary to complete an evaluation.  OT OCCUPATIONAL PROFILE AND HISTORY: Problem focused assessment: Including review of records relating to presenting problem.  CLINICAL DECISION MAKING: LOW - limited treatment options, no task modification necessary  REHAB POTENTIAL: Good for goals  EVALUATION COMPLEXITY: Low    PLAN:  OT FREQUENCY: 1-2x/week  OT DURATION: 8 weeks  PLANNED INTERVENTIONS: 97168 OT Re-evaluation, 97535 self care/ADL training, 16109 therapeutic exercise, 97530 therapeutic activity, 97140 manual therapy, 97018 paraffin, 60454 fluidotherapy, 97034 contrast bath, scar mobilization, passive range of motion, patient/family education, and DME and/or AE  instructions  CONSULTED AND AGREED WITH PLAN OF CARE: Patient   Donie Lemelin, OTR/L,CLT 06/18/2023, 1:32 PM

## 2023-06-25 ENCOUNTER — Ambulatory Visit: Admitting: Occupational Therapy

## 2023-06-25 DIAGNOSIS — R600 Localized edema: Secondary | ICD-10-CM

## 2023-06-25 DIAGNOSIS — M25641 Stiffness of right hand, not elsewhere classified: Secondary | ICD-10-CM

## 2023-06-25 DIAGNOSIS — M25631 Stiffness of right wrist, not elsewhere classified: Secondary | ICD-10-CM

## 2023-06-25 DIAGNOSIS — M79641 Pain in right hand: Secondary | ICD-10-CM

## 2023-06-25 DIAGNOSIS — M6281 Muscle weakness (generalized): Secondary | ICD-10-CM

## 2023-06-25 DIAGNOSIS — L905 Scar conditions and fibrosis of skin: Secondary | ICD-10-CM

## 2023-06-27 ENCOUNTER — Encounter: Payer: Self-pay | Admitting: Occupational Therapy

## 2023-06-27 NOTE — Therapy (Signed)
 OUTPATIENT OCCUPATIONAL THERAPY ORTHO TREATMENT  Patient Name: Taylor Valencia MRN: [id number] DOB:02-26-1970, 53 y.o., female Today's Date: 06/27/2023  PCP: Dr Isadore Marble PROVIDER: Darleene Ege PA  END OF SESSION:  OT End of Session - 06/27/23 1733     Visit Number 3    Number of Visits 12    Date for OT Re-Evaluation 08/11/23    OT Start Time 1630    OT Stop Time 1721    OT Time Calculation (min) 51 min    Activity Tolerance Patient tolerated treatment well    Behavior During Therapy WFL for tasks assessed/performed             Past Medical History:  Diagnosis Date   Arthritis    Chronic back pain    History of bronchitis    Hypertension    Pre-diabetes    Past Surgical History:  Procedure Laterality Date   BOWEL RESECTION     patient had some muscle removed    CARPAL TUNNEL RELEASE Right 05/28/2023   Procedure: RELEASE, CARPAL TUNNEL, ENDOSCOPIC;  Surgeon: Elner Hahn, MD;  Location: [facility];  Service: Orthopedics;  Laterality: Right;   CHOLECYSTECTOMY     COLONOSCOPY WITH PROPOFOL  N/A 03/11/2022   Procedure: COLONOSCOPY WITH PROPOFOL ;  Surgeon: Shane Darling, MD;  Location: [facility];  Service: Endoscopy;  Laterality: N/A;   EYE SURGERY     lasix surgery   LUMBAR LAMINECTOMY/DECOMPRESSION MICRODISCECTOMY N/A 08/24/2015   Procedure: Thoracic nine- ten, Thoracic ten- eleven Laminectomy;  Surgeon: Garry Kansas, MD;  Location: [facility];  Service: Neurosurgery;  Laterality: N/A;  T9-10, T10-11 Laminectomy   TONSILLECTOMY     TRIGGER FINGER RELEASE Right 05/28/2023   Procedure: RELEASE, A1 PULLEY, FOR TRIGGER FINGER;  Surgeon: Elner Hahn, MD;  Location: [facility];  Service: Orthopedics;  Laterality: Right;   Patient Active Problem List   Diagnosis Date Noted   Essential hypertension 12/22/2018   Morbid obesity (HCC) 12/22/2018   Depression 12/22/2018   Thoracic myelopathy 08/24/2015    ONSET DATE: 05/28/23  REFERRING DIAG: R CTS  and 3rd trigger finger release  THERAPY DIAG:  Stiffness of right hand, not elsewhere classified  Muscle weakness (generalized)  Pain in right hand  Stiffness of right wrist, not elsewhere classified  Localized edema  Scar condition and fibrosis of skin  Rationale for Evaluation and Treatment: Rehabilitation  SUBJECTIVE:   SUBJECTIVE STATEMENT: Pt reports the steri strips did not stick well, the ones she purchased, pt still with an opening in the skin, no drainage and tissue underneath healing.  Reports no pain but some "discomfort", numbness in index, MF and ring fingers to the base of the digits.  Reports pain has improved since taking gabapentin .  Pt accompanied by: self  PERTINENT HISTORY: Impression: Carpal tunnel syndrome, right [G56.01] Carpal tunnel syndrome, right (primary encounter diagnosis) Trigger middle finger of right hand  Plan:  1. Treatment options were discussed today with the patient. 2. Sutures removed at today's visit, benzoin and Steri-Strips were applied. The patient was instructed that she may begin the shower at this time. 3. She was given hand exercises to perform at home at this time. She was instructed to begin massaging the incision sites later this week to decrease chances of scar tissue development. 4. The patient was instructed to continue to wear the Velcro wrist splint for 1 more week at night but she does not need to wear it during today. Gradually increase activities as tolerated  to the right hand. 5. Given her limitations with flexion at today's visit, a referral for formal occupational therapy was placed to the patient. A work note was provided keeping the patient out until her next follow-up appointment. 6. The patient will follow-up in 1 month with Dr. Daun Epstein for repeat evaluation of the right hand. They can call the clinic they have any questions, new symptoms develop or symptoms worsen.   PRECAUTIONS: None     WEIGHT BEARING  RESTRICTIONS: No  PAIN:  Are you having pain?   FALLS: Has patient fallen in last 6 months? 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  In her free time doing things around the house  PATIENT GOALS: Get the scar better as well as her motion and strength to be able to do her job and things around the house  NEXT MD VISIT: 07/11/23  OBJECTIVE:  Note: Objective measures were completed at Evaluation unless otherwise noted.  HAND DOMINANCE: Right  ADLs: Patient limited in use of right dominant hand because of incision not closed and guarding as well as increased edema and decreased motion and increased pain   FUNCTIONAL OUTCOME MEASURES: Next session   UPPER EXTREMITY ROM:     Active ROM Right eval Left eval  Shoulder flexion    Shoulder abduction    Shoulder adduction    Shoulder extension    Shoulder internal rotation    Shoulder external rotation    Elbow flexion    Elbow extension    Wrist flexion 40  42  Wrist extension 70 digits bend 66, digits slightly flexed  Wrist ulnar deviation    Wrist radial deviation    Wrist pronation    Wrist supination    (Blank rows = not tested)  Active ROM Right eval Left eval R 06/25/23  Thumb MCP (0-60)     Thumb IP (0-80)     Thumb Radial abd/add (0-55) 42     Thumb Palmar abd/add (0-45) 50     Thumb Opposition to Small Finger      Index MCP (0-90) 50   60  Index PIP (0-100) 70   85  Index DIP (0-70)       Long MCP (0-90) 45    55  Long PIP (0-100)  90   80  Long DIP (0-70)       Ring MCP (0-90) 60    60  Ring PIP (0-100) 80    80  Ring DIP (0-70)       Little MCP (0-90) 60    55  Little PIP (0-100) 80    80  Little DIP (0-70)       (Blank rows = not tested)   HAND FUNCTION: NT Incision open still - Grip strength: Right:   lbs; Left:   lbs, Lateral pinch: Right:   lbs, Left:   lbs, and 3 point pinch: Right:   lbs, Left:    lbs  COORDINATION: Impaired because of increased swelling and pain in stiffness  SENSATION: Numbness in 2nd, 3rd and 4th digits with third throwers  EDEMA: edema on the right hand  COGNITION: Overall cognitive status: Within functional limits for tasks assessed  TREATMENT DATE: 06/25/23  Pt reports she has 3/10 pain, occasionally sharp and occasional burning sensation.  Contrast:  Use of contrast using hot and cold pack to decrease edema, pain and improve tissue mobility.   3 minutes heat 1 minute ice, ending with warm prior to performing exercises.    Area in the palm closed this date, some dead skin around the middle and edges of the area, still tender to the touch.  Scar on wrist doing well with no issues or tenderness.   Manual Therapy: Following contrast, pt seen for manual therapy techniques by therapist for scar massage to palm and wrist.  Utilized cica care silicone pad over palm scar due to sensitivity and to tolerate initiation of gentle scar massage to this area.  Pt educated on massage for home program to perform daily 2-3 times a day and monitor tenderness.  Pt to also perform scar massage to area at wrist, no sensitivity in this area today.    Therapeutic Exercises: Tendon gliding exercises for ROM, 10 reps Wrist flexion/extension, 10 reps Thumb palmar radial abduction, 12 reps MCP flexion and extension 10 reps Intrinsic, blocked 12 reps Measurements taken this date Pt able to demonstrate opposition of thumb to index and middle fingers only. Thumb webspace stretch followed by thumb palmar and radial ABD, ADD for 10 reps.  Supination/pronation of forearm for 10 reps.    Issued additional Tubigrip D for right hand Issued Cica care scar pads to wear at night to hydrate, soften scar and promote healing.  Pt has one for her palm, one for the wrist and  an additional piece to place over palm area when performing scar massage due to increased sensitivity.      PATIENT EDUCATION: Education details: findings of eval and HEP  Person educated: Patient Education method: Explanation, Demonstration, Tactile cues, Verbal cues, and Handouts Education comprehension: verbalized understanding, returned demonstration, verbal cues required, and needs further education    GOALS: Goals reviewed with patient? Yes   LONG TERM GOALS: Target date: 8 wks  Patient be independent in home program to decrease edema and pain for patient to increase digit flexion to touching palm. Baseline: Incision not closed.  Increased edema and pain increased to 8-10/10 in the third digit; MC flexion 45 to 60 degrees and PIP 70-90 Goal status: INITIAL  2.  Wrist flexion extension on the right improved to within normal limits for patient to push and pull heavy door as well as turn doorknob without increased symptoms Baseline: Wrist flexion is 40 degrees and extension 70 with loose fist patient not using hand because of incision not closed Goal status: INITIAL  3.  Incisional right hand improved for patient to initiate scar massage as well as being able to tolerate different textures including tapping and clapping hands with no increase symptoms Baseline: Patient report incision not closed since last time.  Steri-Strips came off and incision opening with digit extension.  Steri-Strips applied if not staying in place patient to contact orthopedics in the next 24 to 48 hours.  Keep home exercises without opening or putting stress on incision Goal status: INITIAL  4.  Right grip and prehension strength improve to more than 75% compared to the left for patient to return to using hand normally at home and cooking and laundry and cleaning activities without increase symptoms Baseline: Patient is about 3 weeks postop.  Patient with increased edema and pain and stiffness.  Incision not  closed-not tested Goal status: INITIAL   ASSESSMENT:  CLINICAL IMPRESSION:  Patient seen for occupational therapy evaluation for right arthroscopic carpal tunnel release and third digit trigger finger release by Dr. Daun Epstein -patient had surgery 05/28/2023. Patient continues to present with decreased range of motion in wrist as well as digits.  Patient has been performing ROM exercises at home over the last few days and demonstrates increased ROM in digits.  Pt to also perform thumb palmar radial abduction, web space stretch and opposition. Issued CICA care scar pad this date for scar in palm and also wrist as well as additional tubigrip D to wear over hand to provide light compression for edema control and keep scar pads in place.  Pt with increased sensitivity at palm scar but able to tolerate scar massage with use of silicone pad over area.   Pt to continue with contrast for edema control, ROM exercises and scar massage, to wear CICA care at night.  Patient limited in functional use of right dominant hand in ADLs and IADLs.  Patient can benefit from skilled OT services to decrease edema, pain, scar tissue and increased motion and strength to return to prior level of function  PERFORMANCE DEFICITS: in functional skills including ADLs, IADLs, ROM, strength, pain, flexibility, decreased knowledge of use of DME, and UE functional use,   and psychosocial skills including environmental adaptation and routines and behaviors.   IMPAIRMENTS: are limiting patient from ADLs, IADLs, rest and sleep, play, leisure, and social participation.   COMORBIDITIES: has no other co-morbidities that affects occupational performance. Patient will benefit from skilled OT to address above impairments and improve overall function.  MODIFICATION OR ASSISTANCE TO COMPLETE EVALUATION: No modification of tasks or assist necessary to complete an evaluation.  OT OCCUPATIONAL PROFILE AND HISTORY: Problem focused assessment: Including  review of records relating to presenting problem.  CLINICAL DECISION MAKING: LOW - limited treatment options, no task modification necessary  REHAB POTENTIAL: Good for goals  EVALUATION COMPLEXITY: Low    PLAN:  OT FREQUENCY: 1-2x/week  OT DURATION: 8 weeks  PLANNED INTERVENTIONS: 97168 OT Re-evaluation, 97535 self care/ADL training, 16109 therapeutic exercise, 97530 therapeutic activity, 97140 manual therapy, 97018 paraffin, 60454 fluidotherapy, 97034 contrast bath, scar mobilization, passive range of motion, patient/family education, and DME and/or AE instructions  CONSULTED AND AGREED WITH PLAN OF CARE: Patient   Rosan Comfort, OTR/L,CLT 06/27/2023, 5:34 PM

## 2023-06-30 ENCOUNTER — Ambulatory Visit: Attending: Student | Admitting: Occupational Therapy

## 2023-06-30 DIAGNOSIS — M79641 Pain in right hand: Secondary | ICD-10-CM | POA: Insufficient documentation

## 2023-06-30 DIAGNOSIS — M25631 Stiffness of right wrist, not elsewhere classified: Secondary | ICD-10-CM | POA: Insufficient documentation

## 2023-06-30 DIAGNOSIS — M6281 Muscle weakness (generalized): Secondary | ICD-10-CM | POA: Diagnosis present

## 2023-06-30 DIAGNOSIS — M25641 Stiffness of right hand, not elsewhere classified: Secondary | ICD-10-CM | POA: Diagnosis present

## 2023-06-30 DIAGNOSIS — L905 Scar conditions and fibrosis of skin: Secondary | ICD-10-CM | POA: Insufficient documentation

## 2023-06-30 DIAGNOSIS — R600 Localized edema: Secondary | ICD-10-CM | POA: Insufficient documentation

## 2023-06-30 NOTE — Therapy (Signed)
 OUTPATIENT OCCUPATIONAL THERAPY ORTHO TREATMENT  Patient Name: Taylor Valencia MRN: [id number] DOB:Nov 21, 1970, 53 y.o., female Today's Date: 06/30/2023  PCP: Dr Isadore Marble PROVIDER: Darleene Ege PA  END OF SESSION:  OT End of Session - 06/30/23 1118     Visit Number 4    Number of Visits 12    Date for OT Re-Evaluation 08/11/23    OT Start Time 1119    OT Stop Time 1210    OT Time Calculation (min) 51 min    Activity Tolerance Patient tolerated treatment well    Behavior During Therapy WFL for tasks assessed/performed             Past Medical History:  Diagnosis Date   Arthritis    Chronic back pain    History of bronchitis    Hypertension    Pre-diabetes    Past Surgical History:  Procedure Laterality Date   BOWEL RESECTION     patient had some muscle removed    CARPAL TUNNEL RELEASE Right 05/28/2023   Procedure: RELEASE, CARPAL TUNNEL, ENDOSCOPIC;  Surgeon: Elner Hahn, MD;  Location: [facility];  Service: Orthopedics;  Laterality: Right;   CHOLECYSTECTOMY     COLONOSCOPY WITH PROPOFOL  N/A 03/11/2022   Procedure: COLONOSCOPY WITH PROPOFOL ;  Surgeon: Shane Darling, MD;  Location: [facility];  Service: Endoscopy;  Laterality: N/A;   EYE SURGERY     lasix surgery   LUMBAR LAMINECTOMY/DECOMPRESSION MICRODISCECTOMY N/A 08/24/2015   Procedure: Thoracic nine- ten, Thoracic ten- eleven Laminectomy;  Surgeon: Garry Kansas, MD;  Location: [facility];  Service: Neurosurgery;  Laterality: N/A;  T9-10, T10-11 Laminectomy   TONSILLECTOMY     TRIGGER FINGER RELEASE Right 05/28/2023   Procedure: RELEASE, A1 PULLEY, FOR TRIGGER FINGER;  Surgeon: Elner Hahn, MD;  Location: [facility];  Service: Orthopedics;  Laterality: Right;   Patient Active Problem List   Diagnosis Date Noted   Essential hypertension 12/22/2018   Morbid obesity (HCC) 12/22/2018   Depression 12/22/2018   Thoracic myelopathy 08/24/2015    ONSET DATE: 05/28/23  REFERRING DIAG: R CTS  and 3rd trigger finger release  THERAPY DIAG:  Stiffness of right hand, not elsewhere classified  Muscle weakness (generalized)  Pain in right hand  Stiffness of right wrist, not elsewhere classified  Localized edema  Scar condition and fibrosis of skin  Rationale for Evaluation and Treatment: Rehabilitation  SUBJECTIVE:   SUBJECTIVE STATEMENT: My incision is closed.  The middle finger is numb most of my index finger.  And it hurts to really bad in the back of my hand sometimes I try make a fist.   Pt accompanied by: self  PERTINENT HISTORY: Impression: Carpal tunnel syndrome, right [G56.01] Carpal tunnel syndrome, right (primary encounter diagnosis) Trigger middle finger of right hand  Plan:  1. Treatment options were discussed today with the patient. 2. Sutures removed at today's visit, benzoin and Steri-Strips were applied. The patient was instructed that she may begin the shower at this time. 3. She was given hand exercises to perform at home at this time. She was instructed to begin massaging the incision sites later this week to decrease chances of scar tissue development. 4. The patient was instructed to continue to wear the Velcro wrist splint for 1 more week at night but she does not need to wear it during today. Gradually increase activities as tolerated to the right hand. 5. Given her limitations with flexion at today's visit, a referral for formal occupational therapy was  placed to the patient. A work note was provided keeping the patient out until her next follow-up appointment. 6. The patient will follow-up in 1 month with Dr. Daun Epstein for repeat evaluation of the right hand. They can call the clinic they have any questions, new symptoms develop or symptoms worsen.   PRECAUTIONS: None     WEIGHT BEARING RESTRICTIONS: No  PAIN:  Are you having pain? No pain ocming in -but when making fist back of the middle finger 8/10 - sharp pain   FALLS: Has patient fallen  in last 6 months? 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  In her free time doing things around the house  PATIENT GOALS: Get the scar better as well as her motion and strength to be able to do her job and things around the house  NEXT MD VISIT: 07/11/23  OBJECTIVE:  Note: Objective measures were completed at Evaluation unless otherwise noted.  HAND DOMINANCE: Right  ADLs: Patient limited in use of right dominant hand because of incision not closed and guarding as well as increased edema and decreased motion and increased pain   FUNCTIONAL OUTCOME MEASURES: Next session   UPPER EXTREMITY ROM:     Active ROM Right eval Left eval R 06/30/23  Shoulder flexion     Shoulder abduction     Shoulder adduction     Shoulder extension     Shoulder internal rotation     Shoulder external rotation     Elbow flexion     Elbow extension     Wrist flexion 40  42 65  Wrist extension 70 digits bend 66, digits slightly flexed 65  Wrist ulnar deviation     Wrist radial deviation     Wrist pronation     Wrist supination     (Blank rows = not tested)  Active ROM Right eval Left eval R 06/25/23 R  06/30/23  Thumb MCP (0-60)      Thumb IP (0-80)      Thumb Radial abd/add (0-55) 42    55  Thumb Palmar abd/add (0-45) 50    50  Thumb Opposition to Small Finger       Index MCP (0-90) 50   60 65  Index PIP (0-100) 70   85 95  Index DIP (0-70)        Long MCP (0-90) 45    55 60  Long PIP (0-100)  90   80 80  Long DIP (0-70)        Ring MCP (0-90) 60    60 60  Ring PIP (0-100) 80    80 95  Ring DIP (0-70)        Little MCP (0-90) 60    55 65  Little PIP (0-100) 80    80 85  Little DIP (0-70)        (Blank rows = not tested)   HAND FUNCTION: NT Incision open still - Grip strength: Right:   lbs; Left:   lbs, Lateral pinch: Right:   lbs, Left:   lbs, and 3 point pinch: Right:   lbs, Left:    lbs  COORDINATION: Impaired because of increased swelling and pain in stiffness  SENSATION: Numbness in 2nd, 3rd and 4th digits with third throwers  EDEMA: edema on the right hand  COGNITION: Overall cognitive status: Within functional limits for tasks assessed  TREATMENT DATE:  06/30/23                                                                                                                            Patient report mostly limited in bending the fingers.  Middle finger especially with pain in the back of the hand.  Sharp shooting pains at times. Numbness in the middle finger and index finger. Sensation appear within normal limits in the fifth.  Diminished in the right fourth digit. Numbness in the third as well as the middle and distal phalanges of the second. Thumb normal sensation Patient incision healed and closed. Because of delayed healing patient with scar adhesion limiting digit flexion of the third mostly   Contrast:  Use of contrast using hot and cold pack to decrease edema, pain and improve tissue mobility.   8 minutes prior to scar mobilization and range of motion Area in the palm closed this date, some dead skin around the middle and edges of the area remove this today tolerated well- scar on wrist doing well with no issues or tenderness.   Manual Therapy: Following contrast, pt seen for manual therapy techniques by therapist for scar massage to palm and wrist.  Utilized cica care silicone pad over palm scar due to sensitivity and to tolerate initiation of gentle scar massage to this area.  Pt educated on massage for home program to perform daily 3 times a day and monitor tenderness.  Patient to focus on carpal tunnel scar with wrist in flexion and extension And for third digit to focus in combination with MCP flexion and extension making sure she does not slide or cause friction.  Continue with Cica -Care scar pad at nighttime OT did use extractor at wrist and palm  patient tolerating well.  Therapeutic Exercises: Tendon gliding exercises for active assisted range of motion for MC flexion, as well as placing hold for composite flexion after blocked intrinsic a fist active range of motion  10 reps Wrist flexion/extension, 10 reps At prayer stretch for composite wrist extension 10 reps Thumb palmar radial abduction, 12 reps Pt able to demonstrate opposition of thumb to index -with impaired opposition to  3rd, 4th and 5th digit and some discomfort Thumb thumb palmar and radial ABD, ADD for 10 reps.  Supination/pronation of forearm for 10 reps.    Issued additional Tubigrip D for right hand Issued Cica care scar pads to wear at night to hydrate, soften scar and promote healing.  Pt has one for her palm, one for the wrist and an additional piece to place over palm area when performing scar massage due to increased sensitivity.      PATIENT EDUCATION: Education details: findings of eval and HEP  Person educated: Patient Education method: Explanation, Demonstration, Tactile cues, Verbal cues, and Handouts Education comprehension: verbalized understanding, returned demonstration, verbal cues required, and needs further education    GOALS: Goals reviewed with patient? Yes   LONG TERM GOALS: Target date: 8  wks  Patient be independent in home program to decrease edema and pain for patient to increase digit flexion to touching palm. Baseline: Incision not closed.  Increased edema and pain increased to 8-10/10 in the third digit; MC flexion 45 to 60 degrees and PIP 70-90 Goal status: INITIAL  2.  Wrist flexion extension on the right improved to within normal limits for patient to push and pull heavy door as well as turn doorknob without increased symptoms Baseline: Wrist flexion is 40 degrees and extension 70 with loose fist patient not using hand because of incision not closed Goal status: INITIAL  3.  Incisional right hand improved for patient to  initiate scar massage as well as being able to tolerate different textures including tapping and clapping hands with no increase symptoms Baseline: Patient report incision not closed since last time.  Steri-Strips came off and incision opening with digit extension.  Steri-Strips applied if not staying in place patient to contact orthopedics in the next 24 to 48 hours.  Keep home exercises without opening or putting stress on incision Goal status: INITIAL  4.  Right grip and prehension strength improve to more than 75% compared to the left for patient to return to using hand normally at home and cooking and laundry and cleaning activities without increase symptoms Baseline: Patient is about 3 weeks postop.  Patient with increased edema and pain and stiffness.  Incision not closed-not tested Goal status: INITIAL   ASSESSMENT:  CLINICAL IMPRESSION: Patient seen for occupational therapy evaluation for right arthroscopic carpal tunnel release and third digit trigger finger release by Dr. Daun Epstein -patient had surgery 05/28/2023.  At evaluation patient arrived with incision open.  Last 2 visits incision closed but appear to have scar adhesion limiting composite flexion especially third digit flexion.  Reviewed with patient again scar mobilization as well as upgraded wrist composite flexion and extension as well as active assisted range of motion for MC flexion and composite flexion placing hold.   Pt to also perform thumb palmar radial abduction, web space stretch and opposition.  CICA care scar pad for scar in palm and also wrist as well as additional tubigrip D to wear over hand to provide light compression for edema control and keep scar pads in place.  Pt with increased sensitivity at palm scar but able to tolerate scar mobilization this date.   Pt to continue with contrast for edema control, ROM exercises and scar massage, to wear CICA care at night.  Patient limited in functional use of right dominant hand  in ADLs and IADLs.  Patient can benefit from skilled OT services to decrease edema, pain, scar tissue and increased motion and strength to return to prior level of function  PERFORMANCE DEFICITS: in functional skills including ADLs, IADLs, ROM, strength, pain, flexibility, decreased knowledge of use of DME, and UE functional use,   and psychosocial skills including environmental adaptation and routines and behaviors.   IMPAIRMENTS: are limiting patient from ADLs, IADLs, rest and sleep, play, leisure, and social participation.   COMORBIDITIES: has no other co-morbidities that affects occupational performance. Patient will benefit from skilled OT to address above impairments and improve overall function.  MODIFICATION OR ASSISTANCE TO COMPLETE EVALUATION: No modification of tasks or assist necessary to complete an evaluation.  OT OCCUPATIONAL PROFILE AND HISTORY: Problem focused assessment: Including review of records relating to presenting problem.  CLINICAL DECISION MAKING: LOW - limited treatment options, no task modification necessary  REHAB POTENTIAL: Good for goals  EVALUATION  COMPLEXITY: Low    PLAN:  OT FREQUENCY: 1-2x/week  OT DURATION: 8 weeks  PLANNED INTERVENTIONS: 97168 OT Re-evaluation, 97535 self care/ADL training, 16109 therapeutic exercise, 97530 therapeutic activity, 97140 manual therapy, 97018 paraffin, 60454 fluidotherapy, 97034 contrast bath, scar mobilization, passive range of motion, patient/family education, and DME and/or AE instructions  CONSULTED AND AGREED WITH PLAN OF CARE: Patient   Heloise Lobo, Taylor Valencia,Taylor Valencia 06/30/2023, 12:55 PM

## 2023-07-01 ENCOUNTER — Ambulatory Visit (INDEPENDENT_AMBULATORY_CARE_PROVIDER_SITE_OTHER): Admitting: Podiatry

## 2023-07-01 DIAGNOSIS — M722 Plantar fascial fibromatosis: Secondary | ICD-10-CM

## 2023-07-01 DIAGNOSIS — M7751 Other enthesopathy of right foot: Secondary | ICD-10-CM | POA: Diagnosis not present

## 2023-07-01 NOTE — Progress Notes (Signed)
 Subjective:  Patient ID: Taylor Valencia, female    DOB: 06-08-1970,  MRN: [id number]  Chief Complaint  Patient presents with   Plantar Fasciitis    53 y.o. female presents with the above complaint.  Patient presents for follow-up of right plantar fasciitis she states is doing a lot better no further pain.  She would like to discuss the ankle pain that she is having.  She would like to discuss steroid injection for that.  Review of Systems: Negative except as noted in the HPI. Denies N/V/F/Ch.  Past Medical History:  Diagnosis Date   Arthritis    Chronic back pain    History of bronchitis    Hypertension    Pre-diabetes     Current Outpatient Medications:    acetaminophen  (TYLENOL ) 650 MG CR tablet, Take 1,300 mg by mouth every 8 (eight) hours as needed for pain., Disp: , Rfl:    Biotin w/ Vitamins C & E (HAIR SKIN & NAILS GUMMIES) 1250-7.5-7.5 MCG-MG-UNT CHEW, Chew 2 tablets by mouth daily., Disp: , Rfl:    hydrochlorothiazide  (MICROZIDE ) 12.5 MG capsule, Take 12.5 mg by mouth daily., Disp: , Rfl:    ibuprofen  (ADVIL ) 800 MG tablet, Take 1 tablet (800 mg total) by mouth 3 (three) times daily with meals., Disp: 30 tablet, Rfl: 0   ketoconazole (NIZORAL) 2 % cream, Apply 1 Application topically daily as needed for irritation., Disp: , Rfl:    lisinopril  (ZESTRIL ) 10 MG tablet, Take 10 mg by mouth daily., Disp: , Rfl:    Magnesium Glycinate 120 MG CAPS, Take 120 mg by mouth 2 (two) times daily., Disp: , Rfl:   Social History   Tobacco Use  Smoking Status Never  Smokeless Tobacco Never    No Known Allergies  Objective:  There were no vitals filed for this visit. There is no height or weight on file to calculate BMI. Constitutional Well developed. Well nourished.  Vascular Dorsalis pedis pulses palpable bilaterally. Posterior tibial pulses palpable bilaterally. Capillary refill normal to all digits.  No cyanosis or clubbing noted. Pedal hair growth normal.   Neurologic Normal speech. Oriented to person, place, and time. Epicritic sensation to light touch grossly present bilaterally.  Dermatologic Nails well groomed and normal in appearance. No open wounds. No skin lesions.  Orthopedic: Normal joint ROM without pain or crepitus bilaterally. No visible deformities. Tender to palpation at the calcaneal tuber right. No pain with calcaneal squeeze right. Ankle ROM diminished range of motion right. Silfverskiold Test: positive right.  Pain on palpation to the left ankle pain with range of motion of the ankle joint.  No deep intra-articular ankle pain noted.  No open wounds or lesion noted.   Radiographs: None  Assessment:   No diagnosis found.   Plan:  Patient was evaluated and treated and all questions answered.  Left ankle capsulitis I explained the patient this could likely be due to compensation however given the strength that has been there and how palpable it is patient benefit from a steroid injection to help decrease pain from regular sleep.  Patient agrees with plan and she would like to proceed with steroid injection. -A steroid injection was performed at right dorsal ankle using 1% plain Lidocaine  and 10 mg of Kenalog. This was well tolerated.   Plantar Fasciitis, right~recurrence with underlying gastrocnemius equinus - Clinically healed and officially discharged from my care discussed shoe gear modification orthotics management.  At this time if any foot and ankle issues in the future he  will come back to see me.  Pes planovalgus -I explained to patient the etiology of pes planovalgus and relationship with Planter fasciitis and various treatment options were discussed.  Given patient foot structure in the setting of Planter fasciitis I believe patient will benefit from custom-made orthotics to help control the hindfoot motion support the arch of the foot and take the stress away from plantar fascial.  Patient agrees with the  plan like to proceed with orthotics -Orthotics dispensed they are functioning well    No follow-ups on file.  Second injection orthotics pes planovalgus

## 2023-07-04 ENCOUNTER — Ambulatory Visit: Admitting: Occupational Therapy

## 2023-07-04 DIAGNOSIS — M6281 Muscle weakness (generalized): Secondary | ICD-10-CM

## 2023-07-04 DIAGNOSIS — M25641 Stiffness of right hand, not elsewhere classified: Secondary | ICD-10-CM

## 2023-07-04 DIAGNOSIS — L905 Scar conditions and fibrosis of skin: Secondary | ICD-10-CM

## 2023-07-04 DIAGNOSIS — M25631 Stiffness of right wrist, not elsewhere classified: Secondary | ICD-10-CM

## 2023-07-04 DIAGNOSIS — M79641 Pain in right hand: Secondary | ICD-10-CM

## 2023-07-04 DIAGNOSIS — R600 Localized edema: Secondary | ICD-10-CM

## 2023-07-04 NOTE — Therapy (Signed)
 OUTPATIENT OCCUPATIONAL THERAPY ORTHO TREATMENT  Patient Name: Taylor Valencia MRN: [id number] DOB:May 17, 1970, 53 y.o., female Today's Date: 07/04/2023  PCP: Dr Isadore Marble PROVIDER: Darleene Ege PA  END OF SESSION:  OT End of Session - 07/04/23 0816     Visit Number 5    Number of Visits 12    Date for OT Re-Evaluation 08/11/23    OT Start Time 0815    OT Stop Time 0903    OT Time Calculation (min) 48 min    Activity Tolerance Patient tolerated treatment well    Behavior During Therapy [facility] for tasks assessed/performed             Past Medical History:  Diagnosis Date   Arthritis    Chronic back pain    History of bronchitis    Hypertension    Pre-diabetes    Past Surgical History:  Procedure Laterality Date   BOWEL RESECTION     patient had some muscle removed    CARPAL TUNNEL RELEASE Right 05/28/2023   Procedure: RELEASE, CARPAL TUNNEL, ENDOSCOPIC;  Surgeon: Elner Hahn, MD;  Location: [facility] ORS;  Service: Orthopedics;  Laterality: Right;   CHOLECYSTECTOMY     COLONOSCOPY WITH PROPOFOL  N/A 03/11/2022   Procedure: COLONOSCOPY WITH PROPOFOL ;  Surgeon: Shane Darling, MD;  Location: [facility];  Service: Endoscopy;  Laterality: N/A;   EYE SURGERY     lasix surgery   LUMBAR LAMINECTOMY/DECOMPRESSION MICRODISCECTOMY N/A 08/24/2015   Procedure: Thoracic nine- ten, Thoracic ten- eleven Laminectomy;  Surgeon: Garry Kansas, MD;  Location: [facility];  Service: Neurosurgery;  Laterality: N/A;  T9-10, T10-11 Laminectomy   TONSILLECTOMY     TRIGGER FINGER RELEASE Right 05/28/2023   Procedure: RELEASE, A1 PULLEY, FOR TRIGGER FINGER;  Surgeon: Elner Hahn, MD;  Location: [facility] OR[facility];  Service: Orthopedics;  Laterality: Right;   Patient Active Problem List   Diagnosis Date Noted   Essential hypertension 12/22/2018   Morbid obesity (HCC) 12/22/2018   Depression 12/22/2018   Thoracic myelopathy 08/24/2015    ONSET DATE: 05/28/23  REFERRING DIAG: R CTS  and 3rd trigger finger release  THERAPY DIAG:  Stiffness of right hand, not elsewhere classified  Muscle weakness (generalized)  Pain in right hand  Stiffness of right wrist, not elsewhere classified  Localized edema  Scar condition and fibrosis of skin  Rationale for Evaluation and Treatment: Rehabilitation  SUBJECTIVE:   SUBJECTIVE STATEMENT: My hand felt little better yesterday but then this morning it stiff.  I massaging the scar.  Pt accompanied by: self  PERTINENT HISTORY: Impression: Carpal tunnel syndrome, right [G56.01] Carpal tunnel syndrome, right (primary encounter diagnosis) Trigger middle finger of right hand  Plan:  1. Treatment options were discussed today with the patient. 2. Sutures removed at today's visit, benzoin and Steri-Strips were applied. The patient was instructed that she may begin the shower at this time. 3. She was given hand exercises to perform at home at this time. She was instructed to begin massaging the incision sites later this week to decrease chances of scar tissue development. 4. The patient was instructed to continue to wear the Velcro wrist splint for 1 more week at night but she does not need to wear it during today. Gradually increase activities as tolerated to the right hand. 5. Given her limitations with flexion at today's visit, a referral for formal occupational therapy was placed to the patient. A work note was provided keeping the patient out until her next follow-up appointment.  6. The patient will follow-up in 1 month with Dr. Daun Epstein for repeat evaluation of the right hand. They can call the clinic they have any questions, new symptoms develop or symptoms worsen.   PRECAUTIONS: None     WEIGHT BEARING RESTRICTIONS: No  PAIN:  Are you having pain? No pain ocming in -but when making fist tight and stiff  FALLS: Has patient fallen in last 6 months? 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  In her free time doing things around the house  PATIENT GOALS: Get the scar better as well as her motion and strength to be able to do her job and things around the house  NEXT MD VISIT: 07/11/23  OBJECTIVE:  Note: Objective measures were completed at Evaluation unless otherwise noted.  HAND DOMINANCE: Right  ADLs: Patient limited in use of right dominant hand because of incision not closed and guarding as well as increased edema and decreased motion and increased pain   FUNCTIONAL OUTCOME MEASURES: Next session   UPPER EXTREMITY ROM:     Active ROM Right eval Left eval R 06/30/23  Shoulder flexion     Shoulder abduction     Shoulder adduction     Shoulder extension     Shoulder internal rotation     Shoulder external rotation     Elbow flexion     Elbow extension     Wrist flexion 40  42 65  Wrist extension 70 digits bend 66, digits slightly flexed 65  Wrist ulnar deviation     Wrist radial deviation     Wrist pronation     Wrist supination     (Blank rows = not tested)  Active ROM Right eval Left eval R 06/25/23 R  06/30/23  Thumb MCP (0-60)      Thumb IP (0-80)      Thumb Radial abd/add (0-55) 42    55  Thumb Palmar abd/add (0-45) 50    50  Thumb Opposition to Small Finger       Index MCP (0-90) 50   60 65  Index PIP (0-100) 70   85 95  Index DIP (0-70)        Long MCP (0-90) 45    55 60  Long PIP (0-100)  90   80 80  Long DIP (0-70)        Ring MCP (0-90) 60    60 60  Ring PIP (0-100) 80    80 95  Ring DIP (0-70)        Little MCP (0-90) 60    55 65  Little PIP (0-100) 80    80 85  Little DIP (0-70)        (Blank rows = not tested)   HAND FUNCTION: NT Incision open still - Grip strength: Right:   lbs; Left:   lbs, Lateral pinch: Right:   lbs, Left:   lbs, and 3 point pinch: Right:   lbs, Left:   lbs  COORDINATION: Impaired because of increased swelling and pain in  stiffness  SENSATION: Numbness in 2nd, 3rd and 4th digits with third throwers  EDEMA: edema on the right hand  COGNITION: Overall cognitive status: Within functional limits for tasks assessed  TREATMENT DATE: 07/04/23  Numbness in the middle finger and index finger still No stiffness tightness in the metacarpals with flexion Sensation appear within normal limits in the fifth.  Diminished in the right fourth digit. Because of delayed healing patient with scar adhesion limiting digit flexion of the third mostly  Fluidotherapy Use of Fluidotherapy with 2 rotations of 1 minute cold pack to decrease edema, pain and improve tissue mobility.   10 minutes prior to scar mobilization and range of motion  Manual Therapy: Following contrast, pt seen for manual therapy techniques by therapist for scar massage to palm and wrist.  Utilized cica care silicone pad over palm scar due to sensitivity and to tolerate initiation of gentle scar massage to this area.  Pt educated on massage for home program to perform daily 3 times a day and monitor tenderness.  Patient to focus on carpal tunnel scar with wrist in flexion and extension And for third digit to focus in combination with MCP flexion and extension making sure she does not slide or cause friction.  Continue with Cica -Care scar pad at nighttime-provided new pieces as well as Tubigrip D OT did use extractor at wrist and palm patient tolerating well. More as well as silicone sleeve for compression on the third digit  Patient fitted this today with a knuckle bender MC dynamic splint.  To assist with flexion of the MCPs. Patient was able to tolerate 3 bands on ulnar and radial side patient to do 2 to 5 minutes max prior to tendon glides. Great success in MCP flexion after use of knuckle bender splint.  Therapeutic Exercises: Tendon  gliding exercises for active assisted range of motion for MC flexion, as well as placing hold for composite flexion after blocked intrinsic a fist active range of motion  10 reps Wrist flexion/extension, 10 reps At prayer stretch for composite wrist extension 10 reps Thumb palmar radial abduction, 12 reps Pt to continue with opposition of thumb to index of all digits except fifth thumb thumb palmar and radial ABD, ADD for 10 reps.  Supination/pronation of forearm for 10 reps.    Issued additional Tubigrip D for right hand Issued Cica care scar pads to wear at night to hydrate, soften scar and promote healing.  Pt has one for her palm, one for the wrist and an additional piece to place over palm area when performing scar massage due to increased sensitivity.      PATIENT EDUCATION: Education details: findings of eval and HEP  Person educated: Patient Education method: Explanation, Demonstration, Tactile cues, Verbal cues, and Handouts Education comprehension: verbalized understanding, returned demonstration, verbal cues required, and needs further education    GOALS: Goals reviewed with patient? Yes   LONG TERM GOALS: Target date: 8 wks  Patient be independent in home program to decrease edema and pain for patient to increase digit flexion to touching palm. Baseline: Incision not closed.  Increased edema and pain increased to 8-10/10 in the third digit; MC flexion 45 to 60 degrees and PIP 70-90 Goal status: INITIAL  2.  Wrist flexion extension on the right improved to within normal limits for patient to push and pull heavy door as well as turn doorknob without increased symptoms Baseline: Wrist flexion is 40 degrees and extension 70 with loose fist patient not using hand because of incision not closed Goal status: INITIAL  3.  Incisional right hand improved for patient to initiate scar massage as well as being able to tolerate different textures including tapping and clapping hands  with no increase symptoms Baseline: Patient report incision not closed since last time.  Steri-Strips came off and incision opening with digit extension.  Steri-Strips applied if not staying in place patient to contact orthopedics in the next 24 to 48 hours.  Keep home exercises without opening or putting stress on incision Goal status: INITIAL  4.  Right grip and prehension strength improve to more than 75% compared to the left for patient to return to using hand normally at home and cooking and laundry and cleaning activities without increase symptoms Baseline: Patient is about 3 weeks postop.  Patient with increased edema and pain and stiffness.  Incision not closed-not tested Goal status: INITIAL   ASSESSMENT:  CLINICAL IMPRESSION: Patient seen for occupational therapy evaluation for right arthroscopic carpal tunnel release and third digit trigger finger release by Dr. Daun Epstein -patient had surgery 05/28/2023.  At evaluation patient arrived with incision open.  Last 2 visits incision closed but appear to have scar adhesion limiting composite flexion especially third digit flexion.  Reviewed with patient again scar mobilization as well as upgraded wrist composite flexion and extension as well as active assisted range of motion for MC flexion and composite flexion placing hold.   Pt to also perform thumb palmar radial abduction, web space stretch and opposition.  CICA care scar pad for scar in palm and also wrist as well as additional tubigrip D to wear over hand to provide light compression for edema control and keep scar pads in place.  Patient was fitted with a silicone sleeve for nighttime for compression for third digit.  Patient limited in composite flexion also on the left hand.  Patient fitted with a knuckle bender MC dynamic splint to use prior to tendon glides to facilitate increased MCP flexion.  Pt to continue with contrast for edema control, ROM exercises and scar massage, to wear CICA care at  night.  Patient limited in functional use of right dominant hand in ADLs and IADLs.  Patient can benefit from skilled OT services to decrease edema, pain, scar tissue and increased motion and strength to return to prior level of function  PERFORMANCE DEFICITS: in functional skills including ADLs, IADLs, ROM, strength, pain, flexibility, decreased knowledge of use of DME, and UE functional use,   and psychosocial skills including environmental adaptation and routines and behaviors.   IMPAIRMENTS: are limiting patient from ADLs, IADLs, rest and sleep, play, leisure, and social participation.   COMORBIDITIES: has no other co-morbidities that affects occupational performance. Patient will benefit from skilled OT to address above impairments and improve overall function.  MODIFICATION OR ASSISTANCE TO COMPLETE EVALUATION: No modification of tasks or assist necessary to complete an evaluation.  OT OCCUPATIONAL PROFILE AND HISTORY: Problem focused assessment: Including review of records relating to presenting problem.  CLINICAL DECISION MAKING: LOW - limited treatment options, no task modification necessary  REHAB POTENTIAL: Good for goals  EVALUATION COMPLEXITY: Low    PLAN:  OT FREQUENCY: 1-2x/week  OT DURATION: 8 weeks  PLANNED INTERVENTIONS: 97168 OT Re-evaluation, 97535 self care/ADL training, 04540 therapeutic exercise, 97530 therapeutic activity, 97140 manual therapy, 97018 paraffin, 98119 fluidotherapy, 97034 contrast bath, scar mobilization, passive range of motion, patient/family education, and DME and/or AE instructions  CONSULTED AND AGREED WITH PLAN OF CARE: Patient   Heloise Lobo, OTR/L,CLT 07/04/2023, 12:43 PM

## 2023-07-08 ENCOUNTER — Ambulatory Visit: Admitting: Occupational Therapy

## 2023-07-08 DIAGNOSIS — M6281 Muscle weakness (generalized): Secondary | ICD-10-CM

## 2023-07-08 DIAGNOSIS — M79641 Pain in right hand: Secondary | ICD-10-CM

## 2023-07-08 DIAGNOSIS — R600 Localized edema: Secondary | ICD-10-CM

## 2023-07-08 DIAGNOSIS — M25641 Stiffness of right hand, not elsewhere classified: Secondary | ICD-10-CM

## 2023-07-08 DIAGNOSIS — M25631 Stiffness of right wrist, not elsewhere classified: Secondary | ICD-10-CM

## 2023-07-08 DIAGNOSIS — L905 Scar conditions and fibrosis of skin: Secondary | ICD-10-CM

## 2023-07-08 NOTE — Therapy (Signed)
 OUTPATIENT OCCUPATIONAL THERAPY ORTHO TREATMENT  Patient Name: Taylor Valencia MRN: [id number] DOB:Oct 21, 1970, 53 y.o., female Today's Date: 07/08/2023  PCP: Dr Isadore Marble PROVIDER: Darleene Ege PA  END OF SESSION:  OT End of Session - 07/08/23 1319     Visit Number 6    Number of Visits 12    Date for OT Re-Evaluation 08/11/23    OT Start Time 1320    Activity Tolerance Patient tolerated treatment well    Behavior During Therapy [facility] for tasks assessed/performed             Past Medical History:  Diagnosis Date   Arthritis    Chronic back pain    History of bronchitis    Hypertension    Pre-diabetes    Past Surgical History:  Procedure Laterality Date   BOWEL RESECTION     patient had some muscle removed    CARPAL TUNNEL RELEASE Right 05/28/2023   Procedure: RELEASE, CARPAL TUNNEL, ENDOSCOPIC;  Surgeon: Elner Hahn, MD;  Location: [facility] ORS;  Service: Orthopedics;  Laterality: Right;   CHOLECYSTECTOMY     COLONOSCOPY WITH PROPOFOL  N/A 03/11/2022   Procedure: COLONOSCOPY WITH PROPOFOL ;  Surgeon: Shane Darling, MD;  Location: [facility] ENDOSCOPY;  Service: Endoscopy;  Laterality: N/A;   EYE SURGERY     lasix surgery   LUMBAR LAMINECTOMY/DECOMPRESSION MICRODISCECTOMY N/A 08/24/2015   Procedure: Thoracic nine- ten, Thoracic ten- eleven Laminectomy;  Surgeon: Garry Kansas, MD;  Location: [facility];  Service: Neurosurgery;  Laterality: N/A;  T9-10, T10-11 Laminectomy   TONSILLECTOMY     TRIGGER FINGER RELEASE Right 05/28/2023   Procedure: RELEASE, A1 PULLEY, FOR TRIGGER FINGER;  Surgeon: Elner Hahn, MD;  Location: [facility] ORS;  Service: Orthopedics;  Laterality: Right;   Patient Active Problem List   Diagnosis Date Noted   Essential hypertension 12/22/2018   Morbid obesity (HCC) 12/22/2018   Depression 12/22/2018   Thoracic myelopathy 08/24/2015    ONSET DATE: 05/28/23  REFERRING DIAG: R CTS and 3rd trigger finger release  THERAPY DIAG:  Stiffness  of right hand, not elsewhere classified  Muscle weakness (generalized)  Pain in right hand  Stiffness of right wrist, not elsewhere classified  Localized edema  Scar condition and fibrosis of skin  Rationale for Evaluation and Treatment: Rehabilitation  SUBJECTIVE:   SUBJECTIVE STATEMENT: Done the exercises in the splint and then massage like you told me- but still tight that middle finger - numbness little better - did not do exercises today -just got up about an hour ago.  When he gets closer to going back to work I would need to get back in my routine.  Pt accompanied by: self  PERTINENT HISTORY: Impression: Carpal tunnel syndrome, right [G56.01] Carpal tunnel syndrome, right (primary encounter diagnosis) Trigger middle finger of right hand  Plan:  1. Treatment options were discussed today with the patient. 2. Sutures removed at today's visit, benzoin and Steri-Strips were applied. The patient was instructed that she may begin the shower at this time. 3. She was given hand exercises to perform at home at this time. She was instructed to begin massaging the incision sites later this week to decrease chances of scar tissue development. 4. The patient was instructed to continue to wear the Velcro wrist splint for 1 more week at night but she does not need to wear it during today. Gradually increase activities as tolerated to the right hand. 5. Given her limitations with flexion at today's visit, a referral for  formal occupational therapy was placed to the patient. A work note was provided keeping the patient out until her next follow-up appointment. 6. The patient will follow-up in 1 month with Dr. Daun Epstein for repeat evaluation of the right hand. They can call the clinic they have any questions, new symptoms develop or symptoms worsen.   PRECAUTIONS: None     WEIGHT BEARING RESTRICTIONS: No  PAIN:  Are you having pain?  Pain over the dorsal third digit with composite  flexion  FALLS: Has patient fallen in last 6 months? 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  In her free time doing things around the house  PATIENT GOALS: Get the scar better as well as her motion and strength to be able to do her job and things around the house  NEXT MD VISIT: 07/11/23  OBJECTIVE:  Note: Objective measures were completed at Evaluation unless otherwise noted.  HAND DOMINANCE: Right  ADLs: Patient limited in use of right dominant hand because of incision not closed and guarding as well as increased edema and decreased motion and increased pain   FUNCTIONAL OUTCOME MEASURES: Next session   UPPER EXTREMITY ROM:     Active ROM Right eval Left eval R 06/30/23  Shoulder flexion     Shoulder abduction     Shoulder adduction     Shoulder extension     Shoulder internal rotation     Shoulder external rotation     Elbow flexion     Elbow extension     Wrist flexion 40  42 65  Wrist extension 70 digits bend 66, digits slightly flexed 65  Wrist ulnar deviation     Wrist radial deviation     Wrist pronation     Wrist supination     (Blank rows = not tested)  Active ROM Right eval Left eval R 06/25/23 R  06/30/23 R 07/08/23  Thumb MCP (0-60)       Thumb IP (0-80)       Thumb Radial abd/add (0-55) 42    55   Thumb Palmar abd/add (0-45) 50    50   Thumb Opposition to Small Finger        Index MCP (0-90) 50   60 65 60  Index PIP (0-100) 70   85 95 95  Index DIP (0-70)         Long MCP (0-90) 45    55 60 60  Long PIP (0-100)  90   80 80 95  Long DIP (0-70)         Ring MCP (0-90) 60    60 60 60  Ring PIP (0-100) 80    80 95 100  Ring DIP (0-70)         Little MCP (0-90) 60    55 65 60  Little PIP (0-100) 80    80 85 95  Little DIP (0-70)         (Blank rows = not tested)   HAND FUNCTION: NT Incision open still - Grip strength: Right:   lbs; Left:   lbs,  Lateral pinch: Right:   lbs, Left:   lbs, and 3 point pinch: Right:   lbs, Left:   lbs  COORDINATION: Impaired because of increased swelling and pain in stiffness  SENSATION: Numbness in 2nd, 3rd and 4th digits with third throwers  EDEMA: edema on the right  hand  COGNITION: Overall cognitive status: Within functional limits for tasks assessed  TREATMENT DATE: 07/08/23                                                                                                                            Patient arrive reporting just got up hour ago. Patient with increased stiffness coming in.  Especially in the metacarpals as well as third digits. Patient appeared to have improvement in numbness in the 3rd and 2nd digits. Will perform Semmes Weinstein extension.  Fluidotherapy Use of Fluidotherapy to decrease stiffness and pain and improve tissue mobility and mobility.   8 minutes prior to scar mobilization and range of motion  Manual Therapy: Following contrast, pt seen for manual therapy techniques by therapist for scar massage to palm and wrist.  Utilized cica care silicone pad over palm scar due to sensitivity and to tolerate initiation of gentle scar massage to this area.  Pt educated on massage for home program to perform daily 3 times a day and monitor tenderness.  Patient to focus on carpal tunnel scar with wrist in flexion and extension And for third digit to focus in combination with MCP flexion and extension making sure she does not slide or cause friction.  Continue with Cica -Care scar pad at nighttime-provided new pieces as well as Tubigrip D OT did use extractor at wrist and palm patient tolerating well. More as well as silicone sleeve for compression on the third digit  Reviewed with patient again knuckle bender MC dynamic splint.  Appear patient was putting it more on the middle phalanges doing PIP flexion.  Able to increase to 4 rubber bands on either sides.  And reviewed with  patient to provide pressure and dynamic stretch on proximal phalanges to increase MC flexion prior to range of motion but after manual therapy.   To assist with flexion of the MCPs. Patient was able to tolerate 5 minutes max prior to tendon glides. Great success in MCP flexion after use of knuckle bender splint.  Therapeutic Exercises: Tendon gliding exercises for active assisted range of motion for MC flexion, Followed by composite passive range of motion on thigh 10 reps Followed by composite flexion manually with other hand 10 reps Followed by placing hold.  10 reps  Patient needed mod verbal cueing as well as assistance to perform home exercises correctly  Patient to continue with prayer stretch for composite wrist extension 10 reps Thumb palmar radial abduction, 12 reps Pt to continue with opposition of thumb to index of all digits except fifth thumb thumb palmar and radial ABD, ADD for 10 reps.  Supination/pronation of forearm for 10 reps.    Issued additional Tubigrip D for right hand Issued Cica care scar pads to wear at night to hydrate, soften scar and promote healing.  Pt has one for her palm, one for the wrist and an additional piece to place over palm area when performing scar massage due to  increased sensitivity.      PATIENT EDUCATION: Education details: findings of eval and HEP  Person educated: Patient Education method: Explanation, Demonstration, Tactile cues, Verbal cues, and Handouts Education comprehension: verbalized understanding, returned demonstration, verbal cues required, and needs further education    GOALS: Goals reviewed with patient? Yes   LONG TERM GOALS: Target date: 8 wks  Patient be independent in home program to decrease edema and pain for patient to increase digit flexion to touching palm. Baseline: Incision not closed.  Increased edema and pain increased to 8-10/10 in the third digit; MC flexion 45 to 60 degrees and PIP 70-90 Goal status:  INITIAL  2.  Wrist flexion extension on the right improved to within normal limits for patient to push and pull heavy door as well as turn doorknob without increased symptoms Baseline: Wrist flexion is 40 degrees and extension 70 with loose fist patient not using hand because of incision not closed Goal status: INITIAL  3.  Incisional right hand improved for patient to initiate scar massage as well as being able to tolerate different textures including tapping and clapping hands with no increase symptoms Baseline: Patient report incision not closed since last time.  Steri-Strips came off and incision opening with digit extension.  Steri-Strips applied if not staying in place patient to contact orthopedics in the next 24 to 48 hours.  Keep home exercises without opening or putting stress on incision Goal status: INITIAL  4.  Right grip and prehension strength improve to more than 75% compared to the left for patient to return to using hand normally at home and cooking and laundry and cleaning activities without increase symptoms Baseline: Patient is about 3 weeks postop.  Patient with increased edema and pain and stiffness.  Incision not closed-not tested Goal status: INITIAL   ASSESSMENT:  CLINICAL IMPRESSION: Patient seen for occupational therapy evaluation for right arthroscopic carpal tunnel release and third digit trigger finger release by Dr. Daun Epstein -patient had surgery 05/28/2023.  At evaluation patient arrived with incision open.  Patient appear to have scar adhesion limiting composite flexion especially third digit flexion.  Reviewed with patient again scar mobilization as well as upgraded wrist composite flexion and extension as well as active assisted range of motion for MC flexion and composite flexion placing hold.   Pt to also perform thumb palmar radial abduction, web space stretch and opposition.  CICA care scar pad for scar in palm and also wrist as well as additional tubigrip D to  wear over hand to provide light compression for edema control and keep scar pads in place.  Recommend for patient to get a Isotoner glove for compression over the metacarpals and digits.  Reviewed with patient again the use of knuckle bender MC dynamic splint to increase range of motion in the MCP flexion.  Patient was performing it for PIP flexion.  Needed mod verbal cueing and demonstration to don and doff correctly.  Patient with great progress after use of knuckle bender in session.  Pt to continue with contrast for edema control, ROM exercises and scar massage, to wear CICA care at night.  Patient limited in functional use of right dominant hand in ADLs and IADLs.  Patient can benefit from skilled OT services to decrease edema, pain, scar tissue and increased motion and strength to return to prior level of function  PERFORMANCE DEFICITS: in functional skills including ADLs, IADLs, ROM, strength, pain, flexibility, decreased knowledge of use of DME, and UE functional use,  and psychosocial skills including environmental adaptation and routines and behaviors.   IMPAIRMENTS: are limiting patient from ADLs, IADLs, rest and sleep, play, leisure, and social participation.   COMORBIDITIES: has no other co-morbidities that affects occupational performance. Patient will benefit from skilled OT to address above impairments and improve overall function.  MODIFICATION OR ASSISTANCE TO COMPLETE EVALUATION: No modification of tasks or assist necessary to complete an evaluation.  OT OCCUPATIONAL PROFILE AND HISTORY: Problem focused assessment: Including review of records relating to presenting problem.  CLINICAL DECISION MAKING: LOW - limited treatment options, no task modification necessary  REHAB POTENTIAL: Good for goals  EVALUATION COMPLEXITY: Low    PLAN:  OT FREQUENCY: 1-2x/week  OT DURATION: 8 weeks  PLANNED INTERVENTIONS: 97168 OT Re-evaluation, 97535 self care/ADL training, 78295  therapeutic exercise, 97530 therapeutic activity, 97140 manual therapy, 97018 paraffin, 62130 fluidotherapy, 97034 contrast bath, scar mobilization, passive range of motion, patient/family education, and DME and/or AE instructions  CONSULTED AND AGREED WITH PLAN OF CARE: Patient   Heloise Lobo, OTR/L,CLT 07/08/2023, 1:20 PM

## 2023-07-10 ENCOUNTER — Ambulatory Visit: Admitting: Occupational Therapy

## 2023-07-10 DIAGNOSIS — L905 Scar conditions and fibrosis of skin: Secondary | ICD-10-CM

## 2023-07-10 DIAGNOSIS — M79641 Pain in right hand: Secondary | ICD-10-CM

## 2023-07-10 DIAGNOSIS — M6281 Muscle weakness (generalized): Secondary | ICD-10-CM

## 2023-07-10 DIAGNOSIS — M25641 Stiffness of right hand, not elsewhere classified: Secondary | ICD-10-CM

## 2023-07-10 DIAGNOSIS — M25631 Stiffness of right wrist, not elsewhere classified: Secondary | ICD-10-CM

## 2023-07-10 DIAGNOSIS — R600 Localized edema: Secondary | ICD-10-CM

## 2023-07-10 NOTE — Therapy (Signed)
 OUTPATIENT OCCUPATIONAL THERAPY ORTHO TREATMENT  Patient Name: Taylor Valencia MRN: [id number] DOB:[birth year], 53 y.o., female Today's Date: 07/10/2023  PCP: Dr Isadore Marble PROVIDER: Darleene Ege PA  END OF SESSION:  OT End of Session - 07/10/23 1403     Visit Number 7    Number of Visits 12    Date for OT Re-Evaluation 08/11/23    OT Start Time 1401    Activity Tolerance Patient tolerated treatment well    Behavior During Therapy [facility] for tasks assessed/performed          Past Medical History:  Diagnosis Date   Arthritis    Chronic back pain    History of bronchitis    Hypertension    Pre-diabetes    Past Surgical History:  Procedure Laterality Date   BOWEL RESECTION     patient had some muscle removed    CARPAL TUNNEL RELEASE Right 05/28/2023   Procedure: RELEASE, CARPAL TUNNEL, ENDOSCOPIC;  Surgeon: Elner Hahn, MD;  Location: [facility] ORS;  Service: Orthopedics;  Laterality: Right;   CHOLECYSTECTOMY     COLONOSCOPY WITH PROPOFOL  N/A 03/11/2022   Procedure: COLONOSCOPY WITH PROPOFOL ;  Surgeon: Shane Darling, MD;  Location: [facility] ENDOSCOPY;  Service: Endoscopy;  Laterality: N/A;   EYE SURGERY     lasix surgery   LUMBAR LAMINECTOMY/DECOMPRESSION MICRODISCECTOMY N/A 08/24/2015   Procedure: Thoracic nine- ten, Thoracic ten- eleven Laminectomy;  Surgeon: Garry Kansas, MD;  Location: [facility];  Service: Neurosurgery;  Laterality: N/A;  T9-10, T10-11 Laminectomy   TONSILLECTOMY     TRIGGER FINGER RELEASE Right 05/28/2023   Procedure: RELEASE, A1 PULLEY, FOR TRIGGER FINGER;  Surgeon: Elner Hahn, MD;  Location: [facility] ORS;  Service: Orthopedics;  Laterality: Right;   Patient Active Problem List   Diagnosis Date Noted   Essential hypertension 12/22/2018   Morbid obesity (HCC) 12/22/2018   Depression 12/22/2018   Thoracic myelopathy 08/24/2015    ONSET DATE: 05/28/23  REFERRING DIAG: R CTS and 3rd trigger finger release  THERAPY DIAG:  Stiffness of  right hand, not elsewhere classified  Muscle weakness (generalized)  Pain in right hand  Stiffness of right wrist, not elsewhere classified  Localized edema  Scar condition and fibrosis of skin  Rationale for Evaluation and Treatment: Rehabilitation  SUBJECTIVE:   SUBJECTIVE STATEMENT: I have been wearing the compression glove last night.  And then after from the splint correctly.  In the mornings it stiff but feeling better.  Pt accompanied by: self  PERTINENT HISTORY: Impression: Carpal tunnel syndrome, right [G56.01] Carpal tunnel syndrome, right (primary encounter diagnosis) Trigger middle finger of right hand  Plan:  1. Treatment options were discussed today with the patient. 2. Sutures removed at today's visit, benzoin and Steri-Strips were applied. The patient was instructed that she may begin the shower at this time. 3. She was given hand exercises to perform at home at this time. She was instructed to begin massaging the incision sites later this week to decrease chances of scar tissue development. 4. The patient was instructed to continue to wear the Velcro wrist splint for 1 more week at night but she does not need to wear it during today. Gradually increase activities as tolerated to the right hand. 5. Given her limitations with flexion at today's visit, a referral for formal occupational therapy was placed to the patient. A work note was provided keeping the patient out until her next follow-up appointment. 6. The patient will follow-up in 1 month with Dr.  Poggi for repeat evaluation of the right hand. They can call the clinic they have any questions, new symptoms develop or symptoms worsen.   PRECAUTIONS: None     WEIGHT BEARING RESTRICTIONS: No  PAIN:  Are you having pain?  Pain over the dorsal third digit with composite flexion  FALLS: Has patient fallen in last 6 months? 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  In her free time doing things around the house  PATIENT GOALS: Get the scar better as well as her motion and strength to be able to do her job and things around the house  NEXT MD VISIT: 07/11/23  OBJECTIVE:  Note: Objective measures were completed at Evaluation unless otherwise noted.  HAND DOMINANCE: Right  ADLs: Patient limited in use of right dominant hand because of incision not closed and guarding as well as increased edema and decreased motion and increased pain   FUNCTIONAL OUTCOME MEASURES: Next session   UPPER EXTREMITY ROM:     Active ROM Right eval Left eval R 06/30/23  Shoulder flexion     Shoulder abduction     Shoulder adduction     Shoulder extension     Shoulder internal rotation     Shoulder external rotation     Elbow flexion     Elbow extension     Wrist flexion 40  42 65  Wrist extension 70 digits bend 66, digits slightly flexed 65  Wrist ulnar deviation     Wrist radial deviation     Wrist pronation     Wrist supination     (Blank rows = not tested)  Active ROM Right eval Left eval R 06/25/23 R  06/30/23 R 07/08/23 R 07/10/23  Thumb MCP (0-60)        Thumb IP (0-80)        Thumb Radial abd/add (0-55) 42    55    Thumb Palmar abd/add (0-45) 50    50    Thumb Opposition to Small Finger         Index MCP (0-90) 50   60 65 60 65  Index PIP (0-100) 70   85 95 95   Index DIP (0-70)          Long MCP (0-90) 45    55 60 60 70  Long PIP (0-100)  90   80 80 95   Long DIP (0-70)          Ring MCP (0-90) 60    60 60 60 70  Ring PIP (0-100) 80    80 95 100   Ring DIP (0-70)          Little MCP (0-90) 60    55 65 60 70  Little PIP (0-100) 80    80 85 95   Little DIP (0-70)          (Blank rows = not tested)   HAND FUNCTION:  Grip strength: Right: 10 lbs; Left: 35 lbs, Lateral pinch: Right: 8 lbs, Left: 12 lbs, and 3 point pinch: Right: 4 lbs, Left: 12 lbs  COORDINATION: Impaired because of  increased swelling and pain in stiffness  SENSATION: Numbness in 2nd, 3rd and 4th digits with third throwers  EDEMA: edema on the right hand  COGNITION: Overall cognitive status: Within functional limits for tasks assessed  TREATMENT DATE: 07/10/23  Patient reports still stiffness in the morning. Patient show decrease edema in the digits in the hand. Increased MCP flexion with composite. Patient report numbness improving. Mostly in the tips of the index, middle and ring finger.  Fluidotherapy Use of Fluidotherapy to decrease stiffness and pain and improve tissue mobility and mobility.   8 minutes prior to scar mobilization and range of motion  Manual Therapy: manual therapy techniques by therapist for scar massage to palm and wrist.  Utilized cica care silicone pad over palm scar due to sensitivity and to tolerate initiation of gentle scar massage to this area.  Pt educated on massage for home program to perform daily 3 times a day and monitor tenderness. Rolling over red roller for palm , volar wrist and forearm - add AAROM for light hyper extention of digits.   Patient to focus on carpal tunnel scar with wrist in flexion and extension And for third digit to focus in combination with MCP flexion and extension making sure she does not slide or cause friction.  Continue with Cica -Care scar pad at nighttime-provided new pieces as well as Tubigrip D  Isotoner glove at nighttime  Reviewed with patient again knuckle bender MC dynamic splint.  -Patient with great progress this time using a knuckle bender correctly -4 rubber bands on either sides.  And reviewed with patient to provide pressure and dynamic stretch on proximal phalanges to increase MC flexion prior to range of motion but after manual therapy.   To assist with flexion of the MCPs. Patient was able to tolerate  5 minutes max prior to tendon glides. Great success in MCP flexion after use of knuckle bender splint.  Therapeutic Exercises: Tendon gliding exercises for active assisted range of motion for MC flexion, Followed by composite passive range of motion on thigh 10 reps-min assist for most pressure over the ulnar 3 digits MCPs. Followed by composite flexion 10 reps to 2 cm foam block Followed by squeezing lightly in 2 foam block focusing on MC flexion during composite.  Patient pain-free end of session  Patient needed min to mod verbal cueing as well as assistance to perform home exercises correctly  Patient to continue with prayer stretch for composite wrist extension 10 reps Thumb palmar radial abduction, 12 reps Pt to continue with opposition of thumb to index of all digits except fifth thumb thumb palmar and radial ABD, ADD for 10 reps.  Supination/pronation of forearm for 10 reps.    Issued additional Tubigrip D for right hand Issued Cica care scar pads to wear at night to hydrate, soften scar and promote healing.  Pt has one for her palm, one for the wrist and an additional piece to place over palm area when performing scar massage due to increased sensitivity.      PATIENT EDUCATION: Education details: findings of eval and HEP  Person educated: Patient Education method: Explanation, Demonstration, Tactile cues, Verbal cues, and Handouts Education comprehension: verbalized understanding, returned demonstration, verbal cues required, and needs further education    GOALS: Goals reviewed with patient? Yes   LONG TERM GOALS: Target date: 8 wks  Patient be independent in home program to decrease edema and pain for patient to increase digit flexion to touching palm. Baseline: Incision not closed.  Increased edema and pain increased to 8-10/10 in the third digit; MC flexion 45 to 60 degrees and PIP 70-90 Goal status: INITIAL  2.  Wrist flexion extension on the right improved to  within normal limits for patient  to push and pull heavy door as well as turn doorknob without increased symptoms Baseline: Wrist flexion is 40 degrees and extension 70 with loose fist patient not using hand because of incision not closed Goal status: INITIAL  3.  Incisional right hand improved for patient to initiate scar massage as well as being able to tolerate different textures including tapping and clapping hands with no increase symptoms Baseline: Patient report incision not closed since last time.  Steri-Strips came off and incision opening with digit extension.  Steri-Strips applied if not staying in place patient to contact orthopedics in the next 24 to 48 hours.  Keep home exercises without opening or putting stress on incision Goal status: INITIAL  4.  Right grip and prehension strength improve to more than 75% compared to the left for patient to return to using hand normally at home and cooking and laundry and cleaning activities without increase symptoms Baseline: Patient is about 3 weeks postop.  Patient with increased edema and pain and stiffness.  Incision not closed-not tested Goal status: INITIAL   ASSESSMENT:  CLINICAL IMPRESSION: Patient seen for occupational therapy for right arthroscopic carpal tunnel release and third digit trigger finger release by Dr. Daun Epstein -patient had surgery 05/28/2023.  At evaluation patient arrived with incision open.  Patient appear to have scar adhesion limiting composite flexion especially third digit flexion.  Reviewed with patient again scar mobilization as well as upgraded wrist composite flexion and extension as well as active assisted range of motion for MC flexion and composite flexion placing hold.   Pt to also perform thumb palmar radial abduction, web space stretch and opposition.  CICA care scar pad for scar in palm and also wrist as well as additional tubigrip D to wear over hand to provide light compression for edema control and keep scar  pads in place. Pt wore Isotoner glove for compression over the metacarpals and digits since yesterday with progress in edema with increase MC flexion.  Reviewed with patient use of knuckle bender MC dynamic splint to increase range of motion in the MCP flexion with great success-   Great progress in ROM in session - with less pain -   Pt to continue with contrast for edema control, ROM exercises and scar massage, to wear CICA care at night.  Patient limited in functional use of right dominant hand in ADLs and IADLs.  Patient can benefit from skilled OT services to decrease edema, pain, scar tissue and increased motion and strength to return to prior level of function  PERFORMANCE DEFICITS: in functional skills including ADLs, IADLs, ROM, strength, pain, flexibility, decreased knowledge of use of DME, and UE functional use,   and psychosocial skills including environmental adaptation and routines and behaviors.   IMPAIRMENTS: are limiting patient from ADLs, IADLs, rest and sleep, play, leisure, and social participation.   COMORBIDITIES: has no other co-morbidities that affects occupational performance. Patient will benefit from skilled OT to address above impairments and improve overall function.  MODIFICATION OR ASSISTANCE TO COMPLETE EVALUATION: No modification of tasks or assist necessary to complete an evaluation.  OT OCCUPATIONAL PROFILE AND HISTORY: Problem focused assessment: Including review of records relating to presenting problem.  CLINICAL DECISION MAKING: LOW - limited treatment options, no task modification necessary  REHAB POTENTIAL: Good for goals  EVALUATION COMPLEXITY: Low    PLAN:  OT FREQUENCY: 1-2x/week  OT DURATION: 8 weeks  PLANNED INTERVENTIONS: 97168 OT Re-evaluation, 97535 self care/ADL training, 19147 therapeutic exercise, 97530 therapeutic activity, 97140  manual therapy, 97018 paraffin, 95284 fluidotherapy, 97034 contrast bath, scar mobilization, passive range of  motion, patient/family education, and DME and/or AE instructions  CONSULTED AND AGREED WITH PLAN OF CARE: Patient   Heloise Lobo, OTR/L,CLT 07/10/2023, 2:03 PM

## 2023-07-14 ENCOUNTER — Ambulatory Visit: Admitting: Occupational Therapy

## 2023-07-14 DIAGNOSIS — M25631 Stiffness of right wrist, not elsewhere classified: Secondary | ICD-10-CM

## 2023-07-14 DIAGNOSIS — L905 Scar conditions and fibrosis of skin: Secondary | ICD-10-CM

## 2023-07-14 DIAGNOSIS — R600 Localized edema: Secondary | ICD-10-CM

## 2023-07-14 DIAGNOSIS — M25641 Stiffness of right hand, not elsewhere classified: Secondary | ICD-10-CM | POA: Diagnosis not present

## 2023-07-14 DIAGNOSIS — M6281 Muscle weakness (generalized): Secondary | ICD-10-CM

## 2023-07-14 DIAGNOSIS — M79641 Pain in right hand: Secondary | ICD-10-CM

## 2023-07-14 NOTE — Therapy (Signed)
 OUTPATIENT OCCUPATIONAL THERAPY ORTHO TREATMENT  Patient Name: Taylor Valencia MRN: [id number] DOB:01-13-1971, 53 y.o., female Today's Date: 07/14/2023  PCP: Dr Isadore Marble PROVIDER: Darleene Ege PA  END OF SESSION:  OT End of Session - 07/14/23 1429     Visit Number 8    Number of Visits 12    Date for OT Re-Evaluation 08/11/23    OT Start Time 1429    Activity Tolerance Patient tolerated treatment well    Behavior During Therapy [facility] for tasks assessed/performed          Past Medical History:  Diagnosis Date   Arthritis    Chronic back pain    History of bronchitis    Hypertension    Pre-diabetes    Past Surgical History:  Procedure Laterality Date   BOWEL RESECTION     patient had some muscle removed    CARPAL TUNNEL RELEASE Right 05/28/2023   Procedure: RELEASE, CARPAL TUNNEL, ENDOSCOPIC;  Surgeon: Elner Hahn, MD;  Location: [facility] ORS;  Service: Orthopedics;  Laterality: Right;   CHOLECYSTECTOMY     COLONOSCOPY WITH PROPOFOL  N/A 03/11/2022   Procedure: COLONOSCOPY WITH PROPOFOL ;  Surgeon: Shane Darling, MD;  Location: [facility] ENDOSCOPY;  Service: Endoscopy;  Laterality: N/A;   EYE SURGERY     lasix surgery   LUMBAR LAMINECTOMY/DECOMPRESSION MICRODISCECTOMY N/A 08/24/2015   Procedure: Thoracic nine- ten, Thoracic ten- eleven Laminectomy;  Surgeon: Garry Kansas, MD;  Location: [facility];  Service: Neurosurgery;  Laterality: N/A;  T9-10, T10-11 Laminectomy   TONSILLECTOMY     TRIGGER FINGER RELEASE Right 05/28/2023   Procedure: RELEASE, A1 PULLEY, FOR TRIGGER FINGER;  Surgeon: Elner Hahn, MD;  Location: [facility] ORS;  Service: Orthopedics;  Laterality: Right;   Patient Active Problem List   Diagnosis Date Noted   Essential hypertension 12/22/2018   Morbid obesity (HCC) 12/22/2018   Depression 12/22/2018   Thoracic myelopathy 08/24/2015    ONSET DATE: 05/28/23  REFERRING DIAG: R CTS and 3rd trigger finger release  THERAPY DIAG:  Stiffness of  right hand, not elsewhere classified  Muscle weakness (generalized)  Pain in right hand  Stiffness of right wrist, not elsewhere classified  Localized edema  Scar condition and fibrosis of skin  Rationale for Evaluation and Treatment: Rehabilitation  SUBJECTIVE:   SUBJECTIVE STATEMENT: Seen DR Poggi -  keeping out of work for another 6-7 wks - middle finger hurting when making fist and pinkie also   Pt accompanied by: self  PERTINENT HISTORY: Impression: Carpal tunnel syndrome, right [G56.01] Carpal tunnel syndrome, right (primary encounter diagnosis) Trigger middle finger of right hand  Plan:  1. Treatment options were discussed today with the patient. 2. Sutures removed at today's visit, benzoin and Steri-Strips were applied. The patient was instructed that she may begin the shower at this time. 3. She was given hand exercises to perform at home at this time. She was instructed to begin massaging the incision sites later this week to decrease chances of scar tissue development. 4. The patient was instructed to continue to wear the Velcro wrist splint for 1 more week at night but she does not need to wear it during today. Gradually increase activities as tolerated to the right hand. 5. Given her limitations with flexion at today's visit, a referral for formal occupational therapy was placed to the patient. A work note was provided keeping the patient out until her next follow-up appointment. 6. The patient will follow-up in 1 month with Dr. Daun Epstein for  repeat evaluation of the right hand. They can call the clinic they have any questions, new symptoms develop or symptoms worsen.   PRECAUTIONS: None     WEIGHT BEARING RESTRICTIONS: No  PAIN:  Are you having pain?  Pain middle finger 5-6/10 at PIP making fist   FALLS: Has patient fallen in last 6 months? 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  In her free time doing things around the house  PATIENT GOALS: Get the scar better as well as her motion and strength to be able to do her job and things around the house  NEXT MD VISIT: 07/11/23  OBJECTIVE:  Note: Objective measures were completed at Evaluation unless otherwise noted.  HAND DOMINANCE: Right  ADLs: Patient limited in use of right dominant hand because of incision not closed and guarding as well as increased edema and decreased motion and increased pain   FUNCTIONAL OUTCOME MEASURES: Next session   UPPER EXTREMITY ROM:     Active ROM Right eval Left eval R 06/30/23  Shoulder flexion     Shoulder abduction     Shoulder adduction     Shoulder extension     Shoulder internal rotation     Shoulder external rotation     Elbow flexion     Elbow extension     Wrist flexion 40  42 65  Wrist extension 70 digits bend 66, digits slightly flexed 65  Wrist ulnar deviation     Wrist radial deviation     Wrist pronation     Wrist supination     (Blank rows = not tested)  Active ROM Right eval Left eval R 06/25/23 R  06/30/23 R 07/08/23 R 07/10/23  Thumb MCP (0-60)        Thumb IP (0-80)        Thumb Radial abd/add (0-55) 42    55    Thumb Palmar abd/add (0-45) 50    50    Thumb Opposition to Small Finger         Index MCP (0-90) 50   60 65 60 65  Index PIP (0-100) 70   85 95 95   Index DIP (0-70)          Long MCP (0-90) 45    55 60 60 70  Long PIP (0-100)  90   80 80 95   Long DIP (0-70)          Ring MCP (0-90) 60    60 60 60 70  Ring PIP (0-100) 80    80 95 100   Ring DIP (0-70)          Little MCP (0-90) 60    55 65 60 70  Little PIP (0-100) 80    80 85 95   Little DIP (0-70)          (Blank rows = not tested)   HAND FUNCTION:  Grip strength: Right: 10 lbs; Left: 35 lbs, Lateral pinch: Right: 8 lbs, Left: 12 lbs, and 3 point pinch: Right: 4 lbs, Left: 12 lbs 07/14/23 Grip strength: Right: 12lbs; Left: 35 lbs, Lateral pinch: Right: 8 lbs,  Left: 12 lbs, and 3 point pinch: Right: 5 lbs, Left: 12 lbs COORDINATION: Impaired because of increased swelling and pain in stiffness  SENSATION: Numbness in 2nd, 3rd and 4th digits with third throwers  EDEMA: edema on the right  hand  COGNITION: Overall cognitive status: Within functional limits for tasks assessed  TREATMENT DATE: 07/14/23                                                                                                                            Patient reports still stiffness in the morning. Increase pain in 3rd over PIP and 5th - with PROM fisting  Grip and prehension assess see flowsheet  Semmes weinstein done -middle phalanges on 3rd 4.56 and DIP of 4th and 3rd was deep pressure  Other digits and distal palm was 4.31  Patient report numbness improving.   Fluidotherapy Use of Fluidotherapy t 2 cycles of ice for 1 minute to decrease edema pain as well as  decrease stiffness -improve tissue mobility and motion prior to scar mobilization and range of motion  Manual Therapy: manual therapy techniques by therapist for scar massage to palm and wrist.  Utilized cica care silicone pad over palm scar due to sensitivity and to tolerate initiation of gentle scar massage to this area.  Done xtractor and Radiation protection practitioner - Pt educated on massage for home program to perform daily 3 times a day and monitor tenderness. Rolling over red roller for palm , volar wrist and forearm - add AAROM for light hyper extention of digits.   Patient to focus on carpal tunnel scar with wrist in flexion and extension And for third digit to focus in combination with MCP flexion and extension making sure she does not slide or cause friction.  Continue with Cica -Care scar pad at nighttime-provided new pieces as well as Tubigrip D  Isotoner glove at nighttime  Pt to cont with knuckle bender MC dynamic splint.  - using a knuckle bender correctly -4 rubber bands on either sides.   Patient was able to  tolerate 5 minutes with slight pull over the proximal phalanges for MC flexion stretch   Therapeutic Exercises: Reviewed with patient and change her  composite passive range of motion stretch to using left palm for MC flexion while maintaining MCP flexion use thumb to facilitate composite flexion at PIP DIP for composite flexion stretch stop when feeling a pull.   Followed by composite flexion 10 reps to 2 cm foam roller And then progress over the next few days to marker  Patient pain-free end of session  Patient needed min to mod verbal cueing as well as assistance to perform home exercises correctly  Patient to continue with prayer stretch for composite wrist extension 10 reps Thumb palmar radial abduction, 12 reps Continue to work on opposition to all digits Reviewed and added with patient 16 ounce hammer 12 reps 2 times a day pain-free Supination pronation, radial ulnar deviation and wrist flexion extension    Issued Cica care scar pads to wear at night to hydrate, soften scar and promote healing.  Pt has one for her palm, one for the wrist and an additional piece to place over palm area when  performing scar massage due to increased sensitivity.      PATIENT EDUCATION: Education details: findings of eval and HEP  Person educated: Patient Education method: Explanation, Demonstration, Tactile cues, Verbal cues, and Handouts Education comprehension: verbalized understanding, returned demonstration, verbal cues required, and needs further education    GOALS: Goals reviewed with patient? Yes   LONG TERM GOALS: Target date: 8 wks  Patient be independent in home program to decrease edema and pain for patient to increase digit flexion to touching palm. Baseline: Incision not closed.  Increased edema and pain increased to 8-10/10 in the third digit; MC flexion 45 to 60 degrees and PIP 70-90 Goal status: INITIAL  2.  Wrist flexion extension on the right improved to within normal  limits for patient to push and pull heavy door as well as turn doorknob without increased symptoms Baseline: Wrist flexion is 40 degrees and extension 70 with loose fist patient not using hand because of incision not closed Goal status: INITIAL  3.  Incisional right hand improved for patient to initiate scar massage as well as being able to tolerate different textures including tapping and clapping hands with no increase symptoms Baseline: Patient report incision not closed since last time.  Steri-Strips came off and incision opening with digit extension.  Steri-Strips applied if not staying in place patient to contact orthopedics in the next 24 to 48 hours.  Keep home exercises without opening or putting stress on incision Goal status: INITIAL  4.  Right grip and prehension strength improve to more than 75% compared to the left for patient to return to using hand normally at home and cooking and laundry and cleaning activities without increase symptoms Baseline: Patient is about 3 weeks postop.  Patient with increased edema and pain and stiffness.  Incision not closed-not tested Goal status: INITIAL   ASSESSMENT:  CLINICAL IMPRESSION: Patient seen for occupational therapy for right arthroscopic carpal tunnel release and third digit trigger finger release by Dr. Daun Epstein -patient had surgery 05/28/2023.  At evaluation patient arrived with incision open.  Patient appear to have scar adhesion limiting composite flexion especially third digit flexion.  Reviewed with patient again scar mobilization as well as upgraded wrist composite flexion and extension as well as active assisted range of motion for MC flexion and composite flexion placing hold.  Change composite flexion to using left hand for MC flexion, maintain MC flexion adding composite flexion.  Pt to also perform thumb palmar radial abduction, web space stretch and opposition.  CICA care scar pad for scar in palm and also wrist and wear Isotoner  glove for compression over the metacarpals and digits with progress in edema with increase MC flexion.  Patient to continue to use of knuckle bender MC dynamic splint to increase range of motion in the MCP flexion with great success-change patient's composite flexion range of motion of home program to decrease force and discomfort at the PIPs and decrease pain.  Pt to continue with contrast for edema control, ROM exercises and scar massage, to wear CICA care at night.  Was able to initiate light strengthening for wrist and forearm tolerating well.  Patient limited in functional use of right dominant hand in ADLs and IADLs.  Patient can benefit from skilled OT services to decrease edema, pain, scar tissue and increased motion and strength to return to prior level of function  PERFORMANCE DEFICITS: in functional skills including ADLs, IADLs, ROM, strength, pain, flexibility, decreased knowledge of use of DME, and UE functional  use,   and psychosocial skills including environmental adaptation and routines and behaviors.   IMPAIRMENTS: are limiting patient from ADLs, IADLs, rest and sleep, play, leisure, and social participation.   COMORBIDITIES: has no other co-morbidities that affects occupational performance. Patient will benefit from skilled OT to address above impairments and improve overall function.  MODIFICATION OR ASSISTANCE TO COMPLETE EVALUATION: No modification of tasks or assist necessary to complete an evaluation.  OT OCCUPATIONAL PROFILE AND HISTORY: Problem focused assessment: Including review of records relating to presenting problem.  CLINICAL DECISION MAKING: LOW - limited treatment options, no task modification necessary  REHAB POTENTIAL: Good for goals  EVALUATION COMPLEXITY: Low    PLAN:  OT FREQUENCY: 1-2x/week  OT DURATION: 8 weeks  PLANNED INTERVENTIONS: 97168 OT Re-evaluation, 97535 self care/ADL training, 60454 therapeutic exercise, 97530 therapeutic activity, 97140  manual therapy, 97018 paraffin, 09811 fluidotherapy, 97034 contrast bath, scar mobilization, passive range of motion, patient/family education, and DME and/or AE instructions  CONSULTED AND AGREED WITH PLAN OF CARE: Patient   Heloise Lobo, OTR/L,CLT 07/14/2023, 2:29 PM

## 2023-07-15 ENCOUNTER — Ambulatory Visit: Admitting: Occupational Therapy

## 2023-07-17 ENCOUNTER — Ambulatory Visit: Admitting: Occupational Therapy

## 2023-07-17 DIAGNOSIS — M6281 Muscle weakness (generalized): Secondary | ICD-10-CM

## 2023-07-17 DIAGNOSIS — R600 Localized edema: Secondary | ICD-10-CM

## 2023-07-17 DIAGNOSIS — M25641 Stiffness of right hand, not elsewhere classified: Secondary | ICD-10-CM | POA: Diagnosis not present

## 2023-07-17 DIAGNOSIS — M79641 Pain in right hand: Secondary | ICD-10-CM

## 2023-07-17 DIAGNOSIS — L905 Scar conditions and fibrosis of skin: Secondary | ICD-10-CM

## 2023-07-17 DIAGNOSIS — M25631 Stiffness of right wrist, not elsewhere classified: Secondary | ICD-10-CM

## 2023-07-17 NOTE — Therapy (Signed)
 OUTPATIENT OCCUPATIONAL THERAPY ORTHO TREATMENT  Patient Name: Taylor Valencia MRN: [id number] DOB:Jun 12, 1970, 53 y.o., female Today's Date: 07/17/2023  PCP: Dr Isadore Marble PROVIDER: Darleene Ege PA  END OF SESSION:  OT End of Session - 07/17/23 1210     Visit Number 9    Number of Visits 12    Date for OT Re-Evaluation 08/11/23    OT Start Time 1200    Activity Tolerance Patient tolerated treatment well    Behavior During Therapy [facility] for tasks assessed/performed          Past Medical History:  Diagnosis Date   Arthritis    Chronic back pain    History of bronchitis    Hypertension    Pre-diabetes    Past Surgical History:  Procedure Laterality Date   BOWEL RESECTION     patient had some muscle removed    CARPAL TUNNEL RELEASE Right 05/28/2023   Procedure: RELEASE, CARPAL TUNNEL, ENDOSCOPIC;  Surgeon: Elner Hahn, MD;  Location: [facility] ORS;  Service: Orthopedics;  Laterality: Right;   CHOLECYSTECTOMY     COLONOSCOPY WITH PROPOFOL  N/A 03/11/2022   Procedure: COLONOSCOPY WITH PROPOFOL ;  Surgeon: Shane Darling, MD;  Location: [facility] ENDOSCOPY;  Service: Endoscopy;  Laterality: N/A;   EYE SURGERY     lasix surgery   LUMBAR LAMINECTOMY/DECOMPRESSION MICRODISCECTOMY N/A 08/24/2015   Procedure: Thoracic nine- ten, Thoracic ten- eleven Laminectomy;  Surgeon: Garry Kansas, MD;  Location: [facility];  Service: Neurosurgery;  Laterality: N/A;  T9-10, T10-11 Laminectomy   TONSILLECTOMY     TRIGGER FINGER RELEASE Right 05/28/2023   Procedure: RELEASE, A1 PULLEY, FOR TRIGGER FINGER;  Surgeon: Elner Hahn, MD;  Location: [facility] ORS;  Service: Orthopedics;  Laterality: Right;   Patient Active Problem List   Diagnosis Date Noted   Essential hypertension 12/22/2018   Morbid obesity (HCC) 12/22/2018   Depression 12/22/2018   Thoracic myelopathy 08/24/2015    ONSET DATE: 05/28/23  REFERRING DIAG: R CTS and 3rd trigger finger release  THERAPY DIAG:  Stiffness of  right hand, not elsewhere classified  Muscle weakness (generalized)  Pain in right hand  Stiffness of right wrist, not elsewhere classified  Localized edema  Scar condition and fibrosis of skin  Rationale for Evaluation and Treatment: Rehabilitation  SUBJECTIVE:   SUBJECTIVE STATEMENT: Yesterday the top of my hand was hurting.  We might binoculars as well as my carpal tunnel scar is 1 area is tender.   Pt accompanied by: self  PERTINENT HISTORY: Impression: Carpal tunnel syndrome, right [G56.01] Carpal tunnel syndrome, right (primary encounter diagnosis) Trigger middle finger of right hand  Plan:  1. Treatment options were discussed today with the patient. 2. Sutures removed at today's visit, benzoin and Steri-Strips were applied. The patient was instructed that she may begin the shower at this time. 3. She was given hand exercises to perform at home at this time. She was instructed to begin massaging the incision sites later this week to decrease chances of scar tissue development. 4. The patient was instructed to continue to wear the Velcro wrist splint for 1 more week at night but she does not need to wear it during today. Gradually increase activities as tolerated to the right hand. 5. Given her limitations with flexion at today's visit, a referral for formal occupational therapy was placed to the patient. A work note was provided keeping the patient out until her next follow-up appointment. 6. The patient will follow-up in 1 month with Dr. Daun Epstein  for repeat evaluation of the right hand. They can call the clinic they have any questions, new symptoms develop or symptoms worsen.   PRECAUTIONS: None     WEIGHT BEARING RESTRICTIONS: No  PAIN:  Are you having pain?  Dorsal 3rd digit with flexion 3/10   FALLS: Has patient fallen in last 6 months? 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  In her free time doing things around the house  PATIENT GOALS: Get the scar better as well as her motion and strength to be able to do her job and things around the house  NEXT MD VISIT: 07/11/23  OBJECTIVE:  Note: Objective measures were completed at Evaluation unless otherwise noted.  HAND DOMINANCE: Right  ADLs: Patient limited in use of right dominant hand because of incision not closed and guarding as well as increased edema and decreased motion and increased pain   FUNCTIONAL OUTCOME MEASURES: Next session   UPPER EXTREMITY ROM:     Active ROM Right eval Left eval R 06/30/23  Shoulder flexion     Shoulder abduction     Shoulder adduction     Shoulder extension     Shoulder internal rotation     Shoulder external rotation     Elbow flexion     Elbow extension     Wrist flexion 40  42 65  Wrist extension 70 digits bend 66, digits slightly flexed 65  Wrist ulnar deviation     Wrist radial deviation     Wrist pronation     Wrist supination     (Blank rows = not tested)  Active ROM Right eval Left eval R 06/25/23 R  06/30/23 R 07/08/23 R 07/10/23  Thumb MCP (0-60)        Thumb IP (0-80)        Thumb Radial abd/add (0-55) 42    55    Thumb Palmar abd/add (0-45) 50    50    Thumb Opposition to Small Finger         Index MCP (0-90) 50   60 65 60 65  Index PIP (0-100) 70   85 95 95   Index DIP (0-70)          Long MCP (0-90) 45    55 60 60 70  Long PIP (0-100)  90   80 80 95   Long DIP (0-70)          Ring MCP (0-90) 60    60 60 60 70  Ring PIP (0-100) 80    80 95 100   Ring DIP (0-70)          Little MCP (0-90) 60    55 65 60 70  Little PIP (0-100) 80    80 85 95   Little DIP (0-70)          (Blank rows = not tested)   HAND FUNCTION:  Grip strength: Right: 10 lbs; Left: 35 lbs, Lateral pinch: Right: 8 lbs, Left: 12 lbs, and 3 point pinch: Right: 4 lbs, Left: 12 lbs 07/14/23 Grip strength: Right: 12lbs; Left: 35 lbs, Lateral pinch: Right: 8 lbs, Left:  12 lbs, and 3 point pinch: Right: 5 lbs, Left: 12 lbs COORDINATION: Impaired because of increased swelling and pain in stiffness  SENSATION: Numbness in 2nd, 3rd and 4th digits with third throwers  EDEMA: edema on the right hand  COGNITION: Overall cognitive status: Within functional limits for tasks assessed  TREATMENT DATE: 07/17/23                                                                                                                           Patient with increased tenderness at lateral side of carpal tunnel scar. Report some pain yesterday over the dorsal hand at the third digit. Reviewed with patient composite passive range of motion flexion. Patient to keep her wrist straight during composite flexion Patient flexing wrist with attempts of passive range of motion composite for digits  Patient reports still stiffness in the morning. Increase pain in 3rd over PIP  - with PROM fisting  Grip and prehension assess see flowsheet-same as last time Add light blue putty for gripping as well as lateral and 3-point pinch. Patient to do 15 reps 2 times a day pain-free. Reinforced to keep wrist straight and stop before she feels pain in the third digit. Limited by numbness in the DIPs of 2nd through 4th.   Fluidotherapy Use of Fluidotherapy decrease stiffness and pain  -improve tissue mobility and motion prior to soft tissue and range of motion   Manual Therapy: manual therapy techniques by therapist for scar massage to palm and wrist.  Utilized cica care silicone pad over palm scar due to sensitivity and to tolerate initiation of gentle scar massage to this area.  Done xtractor and Radiation protection practitioner - Pt educated on massage for home program to perform daily 3 times a day and monitor tenderness. Rolling over red roller for palm , volar wrist and forearm - add AAROM for light hyper extention of digits.   Patient to focus on carpal tunnel scar with wrist in flexion and extension And for  third digit to focus in combination with MCP flexion and extension making sure she does not slide or cause friction.  Continue with Cica -Care scar pad at nighttime-provided new pieces as well as Tubigrip D  Isotoner glove at nighttime     Therapeutic Exercises: Reviewed again with patient and change her  composite passive range of motion stretch to using left palm for MC flexion while maintaining MCP flexion use thumb to facilitate composite flexion at PIP DIP for composite flexion stretch stop when feeling a pull.  Patient to make sure wrist stays straight. Followed by composite flexion to palm Followed by placing hold 10 reps each    Patient to continue with prayer stretch for composite wrist extension 10 reps Add also composite extended arm wrist flexion stretch for forearm 5 reps hold 5 seconds Thumb palmar radial abduction, 12 reps Continue to work on opposition to all digits Patient to continue with 16 ounce hammer 12 reps 2 times a day pain-free Supination pronation, radial ulnar deviation and wrist flexion extension Tolerating well    Issued Cica care scar pads to wear at night to hydrate, soften scar and promote healing.  Pt has one for her palm, one for  the wrist and an additional piece to place over palm area when performing scar massage due to increased sensitivity.      PATIENT EDUCATION: Education details: findings of eval and HEP  Person educated: Patient Education method: Explanation, Demonstration, Tactile cues, Verbal cues, and Handouts Education comprehension: verbalized understanding, returned demonstration, verbal cues required, and needs further education    GOALS: Goals reviewed with patient? Yes   LONG TERM GOALS: Target date: 8 wks  Patient be independent in home program to decrease edema and pain for patient to increase digit flexion to touching palm. Baseline: Incision not closed.  Increased edema and pain increased to 8-10/10 in the third digit;  MC flexion 45 to 60 degrees and PIP 70-90 Goal status: INITIAL  2.  Wrist flexion extension on the right improved to within normal limits for patient to push and pull heavy door as well as turn doorknob without increased symptoms Baseline: Wrist flexion is 40 degrees and extension 70 with loose fist patient not using hand because of incision not closed Goal status: INITIAL  3.  Incisional right hand improved for patient to initiate scar massage as well as being able to tolerate different textures including tapping and clapping hands with no increase symptoms Baseline: Patient report incision not closed since last time.  Steri-Strips came off and incision opening with digit extension.  Steri-Strips applied if not staying in place patient to contact orthopedics in the next 24 to 48 hours.  Keep home exercises without opening or putting stress on incision Goal status: INITIAL  4.  Right grip and prehension strength improve to more than 75% compared to the left for patient to return to using hand normally at home and cooking and laundry and cleaning activities without increase symptoms Baseline: Patient is about 3 weeks postop.  Patient with increased edema and pain and stiffness.  Incision not closed-not tested Goal status: INITIAL   ASSESSMENT:  CLINICAL IMPRESSION: Patient seen for occupational therapy for right arthroscopic carpal tunnel release and third digit trigger finger release by Dr. Daun Epstein -patient had surgery 05/28/2023.  At evaluation patient arrived with incision open.  Patient appear to have scar adhesion limiting composite flexion especially third digit flexion.  Review with patient again to focus on composite  flexion of digits using left hand for MC flexion, maintain MC flexion adding composite flexion.  Placing hold to palm.  At this site light blue putty for gripping and prehension strength pain-free.  Continue with thumb palmar radial abduction, web space stretch and opposition.   CICA care scar pad for scar in palm and also wrist and wear Isotoner glove for compression over the metacarpals and digits with progress in edema with increase MC flexion.    Pt to continue with contrast for edema control, ROM exercises and scar massage, to wear CICA care at night.  Patient to continue with Isotoner glove doing really well with edema.  Patient limited in functional use of right dominant hand in ADLs and IADLs.  Patient can benefit from skilled OT services to decrease edema, pain, scar tissue and increased motion and strength to return to prior level of function  PERFORMANCE DEFICITS: in functional skills including ADLs, IADLs, ROM, strength, pain, flexibility, decreased knowledge of use of DME, and UE functional use,   and psychosocial skills including environmental adaptation and routines and behaviors.   IMPAIRMENTS: are limiting patient from ADLs, IADLs, rest and sleep, play, leisure, and social participation.   COMORBIDITIES: has no other co-morbidities that affects  occupational performance. Patient will benefit from skilled OT to address above impairments and improve overall function.  MODIFICATION OR ASSISTANCE TO COMPLETE EVALUATION: No modification of tasks or assist necessary to complete an evaluation.  OT OCCUPATIONAL PROFILE AND HISTORY: Problem focused assessment: Including review of records relating to presenting problem.  CLINICAL DECISION MAKING: LOW - limited treatment options, no task modification necessary  REHAB POTENTIAL: Good for goals  EVALUATION COMPLEXITY: Low    PLAN:  OT FREQUENCY: 1-2x/week  OT DURATION: 8 weeks  PLANNED INTERVENTIONS: 97168 OT Re-evaluation, 97535 self care/ADL training, 16109 therapeutic exercise, 97530 therapeutic activity, 97140 manual therapy, 97018 paraffin, 60454 fluidotherapy, 97034 contrast bath, scar mobilization, passive range of motion, patient/family education, and DME and/or AE instructions  CONSULTED AND AGREED  WITH PLAN OF CARE: Patient   Heloise Lobo, OTR/L,CLT 07/17/2023, 12:11 PM

## 2023-07-21 ENCOUNTER — Ambulatory Visit: Admitting: Occupational Therapy

## 2023-07-21 DIAGNOSIS — L905 Scar conditions and fibrosis of skin: Secondary | ICD-10-CM

## 2023-07-21 DIAGNOSIS — M25631 Stiffness of right wrist, not elsewhere classified: Secondary | ICD-10-CM

## 2023-07-21 DIAGNOSIS — M79641 Pain in right hand: Secondary | ICD-10-CM

## 2023-07-21 DIAGNOSIS — M6281 Muscle weakness (generalized): Secondary | ICD-10-CM

## 2023-07-21 DIAGNOSIS — M25641 Stiffness of right hand, not elsewhere classified: Secondary | ICD-10-CM

## 2023-07-21 DIAGNOSIS — R600 Localized edema: Secondary | ICD-10-CM

## 2023-07-21 NOTE — Therapy (Signed)
 OUTPATIENT OCCUPATIONAL THERAPY ORTHO TREATMENT/10th visit  Patient Name: Taylor Valencia MRN: [id number] DOB:[birth year], 53 y.o., female Today's Date: 07/21/2023  PCP: Dr Sadie MART PROVIDER: Kip PA  END OF SESSION:  OT End of Session - 07/21/23 1349     Visit Number 10    Number of Visits 12    Date for OT Re-Evaluation 08/11/23    OT Start Time 1349    OT Stop Time 1434    OT Time Calculation (min) 45 min    Activity Tolerance Patient tolerated treatment well    Behavior During Therapy WFL for tasks assessed/performed          Past Medical History:  Diagnosis Date   Arthritis    Chronic back pain    History of bronchitis    Hypertension    Pre-diabetes    Past Surgical History:  Procedure Laterality Date   BOWEL RESECTION     patient had some muscle removed    CARPAL TUNNEL RELEASE Right 05/28/2023   Procedure: RELEASE, CARPAL TUNNEL, ENDOSCOPIC;  Surgeon: Edie Norleen PARAS, MD;  Location: [facility];  Service: Orthopedics;  Laterality: Right;   CHOLECYSTECTOMY     COLONOSCOPY WITH PROPOFOL  N/A 03/11/2022   Procedure: COLONOSCOPY WITH PROPOFOL ;  Surgeon: Maryruth Ole DASEN, MD;  Location: [facility];  Service: Endoscopy;  Laterality: N/A;   EYE SURGERY     lasix surgery   LUMBAR LAMINECTOMY/DECOMPRESSION MICRODISCECTOMY N/A 08/24/2015   Procedure: Thoracic nine- ten, Thoracic ten- eleven Laminectomy;  Surgeon: Reyes Budge, MD;  Location: [facility];  Service: Neurosurgery;  Laterality: N/A;  T9-10, T10-11 Laminectomy   TONSILLECTOMY     TRIGGER FINGER RELEASE Right 05/28/2023   Procedure: RELEASE, A1 PULLEY, FOR TRIGGER FINGER;  Surgeon: Edie Norleen PARAS, MD;  Location: [facility];  Service: Orthopedics;  Laterality: Right;   Patient Active Problem List   Diagnosis Date Noted   Essential hypertension 12/22/2018   Morbid obesity (HCC) 12/22/2018   Depression 12/22/2018   Thoracic myelopathy 08/24/2015    ONSET DATE: 05/28/23  REFERRING DIAG:  R CTS and 3rd trigger finger release  THERAPY DIAG:  Stiffness of right hand, not elsewhere classified  Muscle weakness (generalized)  Pain in right hand  Stiffness of right wrist, not elsewhere classified  Localized edema  Scar condition and fibrosis of skin  Rationale for Evaluation and Treatment: Rehabilitation  SUBJECTIVE:   SUBJECTIVE STATEMENT: I try to use my hand more normally today.  Over the weekend I did some cooking and some cleaning.  When I used the sweeper duster my forearm got tender. Pt accompanied by: self  PERTINENT HISTORY: Impression: Carpal tunnel syndrome, right [G56.01] Carpal tunnel syndrome, right (primary encounter diagnosis) Trigger middle finger of right hand  Plan:  1. Treatment options were discussed today with the patient. 2. Sutures removed at today's visit, benzoin and Steri-Strips were applied. The patient was instructed that she may begin the shower at this time. 3. She was given hand exercises to perform at home at this time. She was instructed to begin massaging the incision sites later this week to decrease chances of scar tissue development. 4. The patient was instructed to continue to wear the Velcro wrist splint for 1 more week at night but she does not need to wear it during today. Gradually increase activities as tolerated to the right hand. 5. Given her limitations with flexion at today's visit, a referral for formal occupational therapy was placed to the patient. A work note  was provided keeping the patient out until her next follow-up appointment. 6. The patient will follow-up in 1 month with Dr. Edie for repeat evaluation of the right hand. They can call the clinic they have any questions, new symptoms develop or symptoms worsen.   PRECAUTIONS: None     WEIGHT BEARING RESTRICTIONS: No  PAIN:  Are you having pain?  Dorsal 3rd digit with flexion 3/10 and volar hand and wrist with pushing door   FALLS: Has patient fallen in  last 6 months? 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  In her free time doing things around the house  PATIENT GOALS: Get the scar better as well as her motion and strength to be able to do her job and things around the house  NEXT MD VISIT: 07/11/23  OBJECTIVE:  Note: Objective measures were completed at Evaluation unless otherwise noted.  HAND DOMINANCE: Right  ADLs: Patient limited in use of right dominant hand because of incision not closed and guarding as well as increased edema and decreased motion and increased pain   FUNCTIONAL OUTCOME MEASURES: Next session   UPPER EXTREMITY ROM:     Active ROM Right eval Left eval R 06/30/23  Shoulder flexion     Shoulder abduction     Shoulder adduction     Shoulder extension     Shoulder internal rotation     Shoulder external rotation     Elbow flexion     Elbow extension     Wrist flexion 40  42 65  Wrist extension 70 digits bend 66, digits slightly flexed 65  Wrist ulnar deviation     Wrist radial deviation     Wrist pronation     Wrist supination     (Blank rows = not tested)  Active ROM Right eval Left eval R 06/25/23 R  06/30/23 R 07/08/23 R 07/10/23  Thumb MCP (0-60)        Thumb IP (0-80)        Thumb Radial abd/add (0-55) 42    55    Thumb Palmar abd/add (0-45) 50    50    Thumb Opposition to Small Finger         Index MCP (0-90) 50   60 65 60 65  Index PIP (0-100) 70   85 95 95   Index DIP (0-70)          Long MCP (0-90) 45    55 60 60 70  Long PIP (0-100)  90   80 80 95   Long DIP (0-70)          Ring MCP (0-90) 60    60 60 60 70  Ring PIP (0-100) 80    80 95 100   Ring DIP (0-70)          Little MCP (0-90) 60    55 65 60 70  Little PIP (0-100) 80    80 85 95   Little DIP (0-70)          (Blank rows = not tested)   HAND FUNCTION:  Grip strength: Right: 10 lbs; Left: 35 lbs, Lateral pinch: Right: 8 lbs,  Left: 12 lbs, and 3 point pinch: Right: 4 lbs, Left: 12 lbs 07/14/23 Grip strength: Right: 12lbs; Left: 35 lbs, Lateral pinch: Right: 8 lbs, Left: 12 lbs, and 3 point pinch: Right: 5 lbs, Left: 12  lbs COORDINATION: Impaired because of increased swelling and pain in stiffness  SENSATION: Numbness in 2nd, 3rd and 4th digits with third throwers  EDEMA: edema on the right hand  COGNITION: Overall cognitive status: Within functional limits for tasks assessed  TREATMENT DATE: 07/21/23                                                                                                                           Assess patient use of right hand and pushing and pulling door.  Patient with some discomfort with pushing over the volar scars.  Pushing up from a chair same. Discomfort over volar scar with pulling. Patient able to carry 3 and 4 pounds with a slight pull over the volar scar.  1 to 2 pounds pain-free. Patient this date with more of a pull and burning pain at 2 volar scars with pushing and increased tightness over the flexors more than fisting and wrist flexion. Continued to have most of the stiffness tightness over the metacarpals.  Fluidotherapy Use of Fluidotherapy decrease stiffness and pain   8 min  -improve tissue mobility and motion prior to soft tissue and range of motion   Manual Therapy: manual therapy techniques by therapist for scar massage to palm and wrist.  Utilized cica care silicone pad over palm scar due to sensitivity and to tolerate  scar massage to this area.  Done xtractor and mini massager -done extractor with composite digit and wrist extension more than flexion.  To facilitate scar mobilization  pt educated on massage for home program to perform daily 3 times a day and monitor tenderness. Rolling over red roller for palm , volar wrist and forearm - add AAROM for light hyper extention of digits.    And for third digit to focus in combination with MCP flexion and extension  making sure she does not slide or cause friction.  Continue with Cica -Care scar pad at nighttime-provided new pieces as well as Tubigrip D  Isotoner glove at nighttime     Therapeutic Exercises:  Patient to continue with prayer stretch for composite wrist extension 10 reps Add and reviewed with patient table slides for finger and wrist composite extension stretch 20 reps can do up to 3 times a day   Reviewed again with patient and reinforced composite passive range of motion stretch to using left palm for MC flexion while maintaining MCP flexion use thumb to facilitate composite flexion at PIP DIP for composite flexion stretch stop when feeling a pull.  Patient to make sure wrist stays straight. Followed by composite flexion to palm Followed by placing hold 10 reps each Reinforced the patient not just to start with composite flexion but work on [facility] and intrinsic of his fist Prior to functional use  Thumb palmar radial abduction, 12 reps Continue to work on opposition to all digits Patient to continue with 16 ounce hammer 12 reps 2 times a day pain-free Supination pronation, radial ulnar deviation and  wrist flexion extension Tolerating well    Issued Cica care scar pads to wear at night to hydrate, soften scar and promote healing.  Pt has one for her palm, one for the wrist and an additional piece to place over palm area when performing scar massage due to increased sensitivity.      PATIENT EDUCATION: Education details: findings of eval and HEP  Person educated: Patient Education method: Explanation, Demonstration, Tactile cues, Verbal cues, and Handouts Education comprehension: verbalized understanding, returned demonstration, verbal cues required, and needs further education    GOALS: Goals reviewed with patient? Yes   LONG TERM GOALS: Target date: 8 wks  Patient be independent in home program to decrease edema and pain for patient to increase digit flexion to touching  palm. Baseline: Incision not closed.  Increased edema and pain increased to 8-10/10 in the third digit; MC flexion 45 to 60 degrees and PIP 70-90 Goal status: INITIAL  2.  Wrist flexion extension on the right improved to within normal limits for patient to push and pull heavy door as well as turn doorknob without increased symptoms Baseline: Wrist flexion is 40 degrees and extension 70 with loose fist patient not using hand because of incision not closed Goal status: INITIAL  3.  Incisional right hand improved for patient to initiate scar massage as well as being able to tolerate different textures including tapping and clapping hands with no increase symptoms Baseline: Patient report incision not closed since last time.  Steri-Strips came off and incision opening with digit extension.  Steri-Strips applied if not staying in place patient to contact orthopedics in the next 24 to 48 hours.  Keep home exercises without opening or putting stress on incision Goal status: INITIAL  4.  Right grip and prehension strength improve to more than 75% compared to the left for patient to return to using hand normally at home and cooking and laundry and cleaning activities without increase symptoms Baseline: Patient is about 3 weeks postop.  Patient with increased edema and pain and stiffness.  Incision not closed-not tested Goal status: INITIAL   ASSESSMENT:  CLINICAL IMPRESSION: Patient seen for occupational therapy for right arthroscopic carpal tunnel release and third digit trigger finger release by Dr. Edie -patient had surgery 05/28/2023.  At evaluation patient arrived with incision open.  Patient appear to have scar adhesion limiting composite flexion especially third digit flexion and composite extension and pushing activities and weightbearing.  Review with patient again to focus on composite  flexion of digits using left hand for MC flexion, maintain MC flexion adding composite flexion.  Placing hold  to palm. Cont light blue putty for gripping and prehension strength pain-free.  Continue with thumb palmar radial abduction, web space stretch and opposition.  CICA care scar pad for scar in palm and also wrist and wear Isotoner glove for compression over the metacarpals and digits with progress in edema with increase MC flexion.  Also this date added table slides facilitation of weightbearing and composite wrist extension.  Pt to continue with contrast for edema control, ROM exercises and scar massage, to wear CICA care at night.  Patient to continue with Isotoner glove doing really well with edema.  Patient limited in functional use of right dominant hand in ADLs and IADLs.  Patient can benefit from skilled OT services to decrease edema, pain, scar tissue and increased motion and strength to return to prior level of function  PERFORMANCE DEFICITS: in functional skills including ADLs, IADLs, ROM, strength,  pain, flexibility, decreased knowledge of use of DME, and UE functional use,   and psychosocial skills including environmental adaptation and routines and behaviors.   IMPAIRMENTS: are limiting patient from ADLs, IADLs, rest and sleep, play, leisure, and social participation.   COMORBIDITIES: has no other co-morbidities that affects occupational performance. Patient will benefit from skilled OT to address above impairments and improve overall function.  MODIFICATION OR ASSISTANCE TO COMPLETE EVALUATION: No modification of tasks or assist necessary to complete an evaluation.  OT OCCUPATIONAL PROFILE AND HISTORY: Problem focused assessment: Including review of records relating to presenting problem.  CLINICAL DECISION MAKING: LOW - limited treatment options, no task modification necessary  REHAB POTENTIAL: Good for goals  EVALUATION COMPLEXITY: Low    PLAN:  OT FREQUENCY: 1-2x/week  OT DURATION: 8 weeks  PLANNED INTERVENTIONS: 97168 OT Re-evaluation, 97535 self care/ADL training, 02889  therapeutic exercise, 97530 therapeutic activity, 97140 manual therapy, 97018 paraffin, 02960 fluidotherapy, 97034 contrast bath, scar mobilization, passive range of motion, patient/family education, and DME and/or AE instructions  CONSULTED AND AGREED WITH PLAN OF CARE: Patient   Ancel Peters, OTR/L,CLT 07/21/2023, 3:07 PM

## 2023-07-24 ENCOUNTER — Ambulatory Visit: Admitting: Occupational Therapy

## 2023-07-24 DIAGNOSIS — M79641 Pain in right hand: Secondary | ICD-10-CM

## 2023-07-24 DIAGNOSIS — M25641 Stiffness of right hand, not elsewhere classified: Secondary | ICD-10-CM | POA: Diagnosis not present

## 2023-07-24 DIAGNOSIS — M6281 Muscle weakness (generalized): Secondary | ICD-10-CM

## 2023-07-24 DIAGNOSIS — R600 Localized edema: Secondary | ICD-10-CM

## 2023-07-24 DIAGNOSIS — L905 Scar conditions and fibrosis of skin: Secondary | ICD-10-CM

## 2023-07-24 DIAGNOSIS — M25631 Stiffness of right wrist, not elsewhere classified: Secondary | ICD-10-CM

## 2023-07-24 NOTE — Therapy (Signed)
 OUTPATIENT OCCUPATIONAL THERAPY ORTHO TREATMENT  Patient Name: Taylor Valencia MRN: [id number] DOB:12-Feb-1970, 53 y.o., female Today's Date: 07/24/2023  PCP: Dr Sadie MART PROVIDER: Kip PA  END OF SESSION:  OT End of Session - 07/24/23 0907     Visit Number 11    Number of Visits 12    Date for OT Re-Evaluation 08/11/23    OT Start Time 0907    OT Stop Time 0950    OT Time Calculation (min) 43 min    Activity Tolerance Patient tolerated treatment well    Behavior During Therapy [facility] for tasks assessed/performed          Past Medical History:  Diagnosis Date   Arthritis    Chronic back pain    History of bronchitis    Hypertension    Pre-diabetes    Past Surgical History:  Procedure Laterality Date   BOWEL RESECTION     patient had some muscle removed    CARPAL TUNNEL RELEASE Right 05/28/2023   Procedure: RELEASE, CARPAL TUNNEL, ENDOSCOPIC;  Surgeon: Edie Norleen PARAS, MD;  Location: [facility] ORS;  Service: Orthopedics;  Laterality: Right;   CHOLECYSTECTOMY     COLONOSCOPY WITH PROPOFOL  N/A 03/11/2022   Procedure: COLONOSCOPY WITH PROPOFOL ;  Surgeon: Maryruth Ole DASEN, MD;  Location: [facility] ENDOSCOPY;  Service: Endoscopy;  Laterality: N/A;   EYE SURGERY     lasix surgery   LUMBAR LAMINECTOMY/DECOMPRESSION MICRODISCECTOMY N/A 08/24/2015   Procedure: Thoracic nine- ten, Thoracic ten- eleven Laminectomy;  Surgeon: Reyes Budge, MD;  Location: [facility];  Service: Neurosurgery;  Laterality: N/A;  T9-10, T10-11 Laminectomy   TONSILLECTOMY     TRIGGER FINGER RELEASE Right 05/28/2023   Procedure: RELEASE, A1 PULLEY, FOR TRIGGER FINGER;  Surgeon: Edie Norleen PARAS, MD;  Location: [facility] ORS;  Service: Orthopedics;  Laterality: Right;   Patient Active Problem List   Diagnosis Date Noted   Essential hypertension 12/22/2018   Morbid obesity (HCC) 12/22/2018   Depression 12/22/2018   Thoracic myelopathy 08/24/2015    ONSET DATE: 05/28/23  REFERRING DIAG: R CTS and  3rd trigger finger release  THERAPY DIAG:  Stiffness of right hand, not elsewhere classified  Muscle weakness (generalized)  Pain in right hand  Stiffness of right wrist, not elsewhere classified  Localized edema  Scar condition and fibrosis of skin  Rationale for Evaluation and Treatment: Rehabilitation  SUBJECTIVE:   SUBJECTIVE STATEMENT: My middle joint of my third digit was bothering me a lot.  Stiff in the morning.  This carpal tunnel scar still tender.  I am trying to use it more. Pt accompanied by: self  PERTINENT HISTORY: Impression: Carpal tunnel syndrome, right [G56.01] Carpal tunnel syndrome, right (primary encounter diagnosis) Trigger middle finger of right hand  Plan:  1. Treatment options were discussed today with the patient. 2. Sutures removed at today's visit, benzoin and Steri-Strips were applied. The patient was instructed that she may begin the shower at this time. 3. She was given hand exercises to perform at home at this time. She was instructed to begin massaging the incision sites later this week to decrease chances of scar tissue development. 4. The patient was instructed to continue to wear the Velcro wrist splint for 1 more week at night but she does not need to wear it during today. Gradually increase activities as tolerated to the right hand. 5. Given her limitations with flexion at today's visit, a referral for formal occupational therapy was placed to the patient. A work note  was provided keeping the patient out until her next follow-up appointment. 6. The patient will follow-up in 1 month with Dr. Edie for repeat evaluation of the right hand. They can call the clinic they have any questions, new symptoms develop or symptoms worsen.   PRECAUTIONS: None     WEIGHT BEARING RESTRICTIONS: No  PAIN:  Are you having pain?  3rd PIP 4-5/10 pain - tendernenss   FALLS: Has patient fallen in last 6 months? 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  In her free time doing things around the house  PATIENT GOALS: Get the scar better as well as her motion and strength to be able to do her job and things around the house  NEXT MD VISIT: 07/11/23  OBJECTIVE:  Note: Objective measures were completed at Evaluation unless otherwise noted.  HAND DOMINANCE: Right  ADLs: Patient limited in use of right dominant hand because of incision not closed and guarding as well as increased edema and decreased motion and increased pain   FUNCTIONAL OUTCOME MEASURES: Next session   UPPER EXTREMITY ROM:     Active ROM Right eval Left eval R 06/30/23 R 07/24/23  Shoulder flexion      Shoulder abduction      Shoulder adduction      Shoulder extension      Shoulder internal rotation      Shoulder external rotation      Elbow flexion      Elbow extension      Wrist flexion 40  42 65 70  Wrist extension 70 digits bend 66, digits slightly flexed 65 70  Wrist ulnar deviation      Wrist radial deviation      Wrist pronation      Wrist supination      (Blank rows = not tested)  Active ROM Right eval Left eval R 06/25/23 R  06/30/23 R 07/08/23 R 07/10/23 R 07/24/23  Thumb MCP (0-60)         Thumb IP (0-80)         Thumb Radial abd/add (0-55) 42    55     Thumb Palmar abd/add (0-45) 50    50     Thumb Opposition to Small Finger          Index MCP (0-90) 50   60 65 60 65 60  Index PIP (0-100) 70   85 95 95  90  Index DIP (0-70)           Long MCP (0-90) 45    55 60 60 70 60  Long PIP (0-100)  90   80 80 95  95  Long DIP (0-70)           Ring MCP (0-90) 60    60 60 60 70 65  Ring PIP (0-100) 80    80 95 100  100  Ring DIP (0-70)           Little MCP (0-90) 60    55 65 60 70 70  Little PIP (0-100) 80    80 85 95  100  Little DIP (0-70)           (Blank rows = not tested)   HAND FUNCTION:  Grip strength: Right: 10 lbs; Left: 35 lbs, Lateral pinch:  Right: 8 lbs, Left: 12 lbs, and 3 point pinch: Right: 4 lbs, Left:  12 lbs 07/14/23 Grip strength: Right: 12lbs; Left: 35 lbs, Lateral pinch: Right: 8 lbs, Left: 12 lbs, and 3 point pinch: Right: 5 lbs, Left: 12 lbs 07/24/23 Grip strength: Right: 16lbs; Left: 35 lbs, Lateral pinch: Right: 8 lbs, Left: 12 lbs, and 3 point pinch: Right: 5 lbs, Left: 12 lbs COORDINATION: Impaired because of increased swelling and pain in stiffness  SENSATION: Numbness in 2nd, 3rd and 4th digits with third throwers  EDEMA: edema on the right hand  COGNITION: Overall cognitive status: Within functional limits for tasks assessed  TREATMENT DATE: 07/24/23                                                                                                                           Increase tenderness and pain over PIP of 3rd coming in today Patient compensating with composite fist with PIP flexion of the third. Observed patient using 2-point pinch to pick up objects because of numbness and pins-and-needles in the third. Patient fitted with a buddy strap to use on 2nd and 3rd to encourage functional use of third digit during the day.  Patient educated in wearing.   Fluidotherapy Use of Fluidotherapy decrease stiffness and pain   8 min  -improve tissue mobility and motion prior to soft tissue and range of motion  Motion increase by 5-10 decrease at [facility] 's  Add Knuckle bender splint with 4 bands each side for 2 times 2 min to increase MC flexion - prior to composite flexion And putty  Reassess AROM at [facility] - increase to 65-85 degrees  Educated patient on using knuckle bender MC flexion splint instead of composite passive range of motion decreasing pressure on the third PIP and decreasing pain over the next few days   Manual Therapy: manual therapy techniques by therapist for scar massage to palm and wrist.  Utilized cica care silicone pad over palm scar due to sensitivity and to tolerate  scar massage to this area.  Done  mini massager  pt educated on massage for home program to perform daily 3 times a day and monitor tenderness. Rolling over red roller for palm , volar wrist and forearm - add AAROM for light hyper extention of digits.    Continue with Cica -Care scar pad at nighttime-provided new pieces as well as Tubigrip D  Isotoner glove at nighttime and as needed during the day     Therapeutic Exercises:  Patient to continue with prayer stretch for composite wrist extension 10 reps Cont  table slides for finger and wrist composite extension stretch 20 reps can do up to 3 times a day   Knuckle bender splint 2-3 min x 2 Prior to  composite flexion to palm Buddy strap add to 2nd and 3rd t digit to facilitate active assisted range of motion for composite flexion Teal medium putty for gripping as well as lateral pinch.  12 reps pain-free 2 times a day.  Patient can wear buddy strap with lateral  pinch. Patient demonstrate decreased functional use of the third digit because of numbness and decree sensation-Buddy strap to facilitate increased functional use of third  Thumb palmar radial abduction, 12 reps Continue to work on opposition to all digits   Issued Cica care scar pads to wear at night to hydrate, soften scar and promote healing.  Pt has one for her palm, one for the wrist and an additional piece to place over palm area when performing scar massage due to increased sensitivity.      PATIENT EDUCATION: Education details: findings of eval and HEP  Person educated: Patient Education method: Explanation, Demonstration, Tactile cues, Verbal cues, and Handouts Education comprehension: verbalized understanding, returned demonstration, verbal cues required, and needs further education    GOALS: Goals reviewed with patient? Yes   LONG TERM GOALS: Target date: 8 wks  Patient be independent in home program to decrease edema and pain for patient to increase digit flexion to touching  palm. Baseline: Incision not closed.  Increased edema and pain increased to 8-10/10 in the third digit; MC flexion 45 to 60 degrees and PIP 70-90 Goal status: INITIAL  2.  Wrist flexion extension on the right improved to within normal limits for patient to push and pull heavy door as well as turn doorknob without increased symptoms Baseline: Wrist flexion is 40 degrees and extension 70 with loose fist patient not using hand because of incision not closed Goal status: INITIAL  3.  Incisional right hand improved for patient to initiate scar massage as well as being able to tolerate different textures including tapping and clapping hands with no increase symptoms Baseline: Patient report incision not closed since last time.  Steri-Strips came off and incision opening with digit extension.  Steri-Strips applied if not staying in place patient to contact orthopedics in the next 24 to 48 hours.  Keep home exercises without opening or putting stress on incision Goal status: INITIAL  4.  Right grip and prehension strength improve to more than 75% compared to the left for patient to return to using hand normally at home and cooking and laundry and cleaning activities without increase symptoms Baseline: Patient is about 3 weeks postop.  Patient with increased edema and pain and stiffness.  Incision not closed-not tested Goal status: INITIAL   ASSESSMENT:  CLINICAL IMPRESSION: Patient seen for occupational therapy for right arthroscopic carpal tunnel release and third digit trigger finger release by Dr. Edie -patient had surgery 05/28/2023.  At evaluation patient arrived with incision open.  Patient appear to have scar adhesion limiting composite flexion especially third digit flexion and composite extension and pushing activities and weightbearing.  Patient continues to need passive range of motion for composite flexion especially third digit.  Observed patient not using third digit because of numbness and  increased pins-and-needles.  Change home program for patient to use knuckle bender MC splint again prior to facilitation of composite flexion.  Great success in session after heat.  Patient to continue with medium teal putty at the end of session pain-free.  Also assists fitted patient with a buddy strap for 2nd and 3rd to facilitate increased functional use of third digit.  Pt to continue with contrast for edema control, ROM exercises and scar massage, to wear CICA care at night.  Patient to continue with Isotoner glove doing really well with edema.  Patient limited in functional use of right dominant hand in ADLs and IADLs.  Patient can benefit from skilled OT services to decrease edema, pain,  scar tissue and increased motion and strength to return to prior level of function  PERFORMANCE DEFICITS: in functional skills including ADLs, IADLs, ROM, strength, pain, flexibility, decreased knowledge of use of DME, and UE functional use,   and psychosocial skills including environmental adaptation and routines and behaviors.   IMPAIRMENTS: are limiting patient from ADLs, IADLs, rest and sleep, play, leisure, and social participation.   COMORBIDITIES: has no other co-morbidities that affects occupational performance. Patient will benefit from skilled OT to address above impairments and improve overall function.  MODIFICATION OR ASSISTANCE TO COMPLETE EVALUATION: No modification of tasks or assist necessary to complete an evaluation.  OT OCCUPATIONAL PROFILE AND HISTORY: Problem focused assessment: Including review of records relating to presenting problem.  CLINICAL DECISION MAKING: LOW - limited treatment options, no task modification necessary  REHAB POTENTIAL: Good for goals  EVALUATION COMPLEXITY: Low    PLAN:  OT FREQUENCY: 1-2x/week  OT DURATION: 8 weeks  PLANNED INTERVENTIONS: 97168 OT Re-evaluation, 97535 self care/ADL training, 02889 therapeutic exercise, 97530 therapeutic activity,  97140 manual therapy, 97018 paraffin, 02960 fluidotherapy, 97034 contrast bath, scar mobilization, passive range of motion, patient/family education, and DME and/or AE instructions  CONSULTED AND AGREED WITH PLAN OF CARE: Patient   Ancel Peters, OTR/L,CLT 07/24/2023, 12:44 PM

## 2023-07-28 ENCOUNTER — Ambulatory Visit: Admitting: Occupational Therapy

## 2023-07-28 DIAGNOSIS — L905 Scar conditions and fibrosis of skin: Secondary | ICD-10-CM

## 2023-07-28 DIAGNOSIS — M6281 Muscle weakness (generalized): Secondary | ICD-10-CM

## 2023-07-28 DIAGNOSIS — R600 Localized edema: Secondary | ICD-10-CM

## 2023-07-28 DIAGNOSIS — M79641 Pain in right hand: Secondary | ICD-10-CM

## 2023-07-28 DIAGNOSIS — M25641 Stiffness of right hand, not elsewhere classified: Secondary | ICD-10-CM | POA: Diagnosis not present

## 2023-07-28 DIAGNOSIS — M25631 Stiffness of right wrist, not elsewhere classified: Secondary | ICD-10-CM

## 2023-07-28 NOTE — Therapy (Signed)
 OUTPATIENT OCCUPATIONAL THERAPY ORTHO TREATMENT/RECERT  Patient Name: Taylor Valencia MRN: [id number] DOB:1970/08/08, 53 y.o., female Today's Date: 07/28/2023  PCP: Dr Sadie MART PROVIDER: Kip PA  END OF SESSION:  OT End of Session - 07/28/23 1439     Visit Number 12    Number of Visits 18    Date for OT Re-Evaluation 09/08/23    OT Start Time 1437    OT Stop Time 1515    OT Time Calculation (min) 38 min    Activity Tolerance Patient tolerated treatment well    Behavior During Therapy WFL for tasks assessed/performed          Past Medical History:  Diagnosis Date   Arthritis    Chronic back pain    History of bronchitis    Hypertension    Pre-diabetes    Past Surgical History:  Procedure Laterality Date   BOWEL RESECTION     patient had some muscle removed    CARPAL TUNNEL RELEASE Right 05/28/2023   Procedure: RELEASE, CARPAL TUNNEL, ENDOSCOPIC;  Surgeon: Edie Norleen PARAS, MD;  Location: [facility];  Service: Orthopedics;  Laterality: Right;   CHOLECYSTECTOMY     COLONOSCOPY WITH PROPOFOL  N/A 03/11/2022   Procedure: COLONOSCOPY WITH PROPOFOL ;  Surgeon: Maryruth Ole DASEN, MD;  Location: [facility];  Service: Endoscopy;  Laterality: N/A;   EYE SURGERY     lasix surgery   LUMBAR LAMINECTOMY/DECOMPRESSION MICRODISCECTOMY N/A 08/24/2015   Procedure: Thoracic nine- ten, Thoracic ten- eleven Laminectomy;  Surgeon: Reyes Budge, MD;  Location: [facility];  Service: Neurosurgery;  Laterality: N/A;  T9-10, T10-11 Laminectomy   TONSILLECTOMY     TRIGGER FINGER RELEASE Right 05/28/2023   Procedure: RELEASE, A1 PULLEY, FOR TRIGGER FINGER;  Surgeon: Edie Norleen PARAS, MD;  Location: [facility];  Service: Orthopedics;  Laterality: Right;   Patient Active Problem List   Diagnosis Date Noted   Essential hypertension 12/22/2018   Morbid obesity (HCC) 12/22/2018   Depression 12/22/2018   Thoracic myelopathy 08/24/2015    ONSET DATE: 05/28/23  REFERRING DIAG: R  CTS and 3rd trigger finger release  THERAPY DIAG:  Stiffness of right hand, not elsewhere classified  Muscle weakness (generalized)  Pain in right hand  Stiffness of right wrist, not elsewhere classified  Localized edema  Scar condition and fibrosis of skin  Rationale for Evaluation and Treatment: Rehabilitation  SUBJECTIVE:   SUBJECTIVE STATEMENT: My CT scar still hurt and times - like burning pain -and then the palm and thumb hurts after I do the putty - MIddle finger joint still tender  Pt accompanied by: self  PERTINENT HISTORY: Impression: Carpal tunnel syndrome, right [G56.01] Carpal tunnel syndrome, right (primary encounter diagnosis) Trigger middle finger of right hand  Plan:  1. Treatment options were discussed today with the patient. 2. Sutures removed at today's visit, benzoin and Steri-Strips were applied. The patient was instructed that she may begin the shower at this time. 3. She was given hand exercises to perform at home at this time. She was instructed to begin massaging the incision sites later this week to decrease chances of scar tissue development. 4. The patient was instructed to continue to wear the Velcro wrist splint for 1 more week at night but she does not need to wear it during today. Gradually increase activities as tolerated to the right hand. 5. Given her limitations with flexion at today's visit, a referral for formal occupational therapy was placed to the patient. A work note was provided  keeping the patient out until her next follow-up appointment. 6. The patient will follow-up in 1 month with Dr. Edie for repeat evaluation of the right hand. They can call the clinic they have any questions, new symptoms develop or symptoms worsen.   PRECAUTIONS: None     WEIGHT BEARING RESTRICTIONS: No  PAIN:  Are you having pain?  3rd PIP 4-5/10 pain - tendernenss   FALLS: Has patient fallen in last 6 months? 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  In her free time doing things around the house  PATIENT GOALS: Get the scar better as well as her motion and strength to be able to do her job and things around the house  NEXT MD VISIT: 07/11/23  OBJECTIVE:  Note: Objective measures were completed at Evaluation unless otherwise noted.  HAND DOMINANCE: Right  ADLs: Patient limited in use of right dominant hand because of incision not closed and guarding as well as increased edema and decreased motion and increased pain   FUNCTIONAL OUTCOME MEASURES: Next session   UPPER EXTREMITY ROM:     Active ROM Right eval Left eval R 06/30/23 R 07/24/23  Shoulder flexion      Shoulder abduction      Shoulder adduction      Shoulder extension      Shoulder internal rotation      Shoulder external rotation      Elbow flexion      Elbow extension      Wrist flexion 40  42 65 70  Wrist extension 70 digits bend 66, digits slightly flexed 65 70  Wrist ulnar deviation      Wrist radial deviation      Wrist pronation      Wrist supination      (Blank rows = not tested)  Active ROM Right eval Left eval R 06/25/23 R  06/30/23 R 07/08/23 R 07/10/23 R 07/24/23  Thumb MCP (0-60)         Thumb IP (0-80)         Thumb Radial abd/add (0-55) 42    55     Thumb Palmar abd/add (0-45) 50    50     Thumb Opposition to Small Finger          Index MCP (0-90) 50   60 65 60 65 60  Index PIP (0-100) 70   85 95 95  90  Index DIP (0-70)           Long MCP (0-90) 45    55 60 60 70 60  Long PIP (0-100)  90   80 80 95  95  Long DIP (0-70)           Ring MCP (0-90) 60    60 60 60 70 65  Ring PIP (0-100) 80    80 95 100  100  Ring DIP (0-70)           Little MCP (0-90) 60    55 65 60 70 70  Little PIP (0-100) 80    80 85 95  100  Little DIP (0-70)           (Blank rows = not tested)   HAND FUNCTION:  Grip strength: Right: 10 lbs; Left: 35 lbs, Lateral  pinch: Right: 8 lbs, Left: 12 lbs, and 3 point pinch: Right: 4 lbs, Left: 12 lbs  07/14/23 Grip strength: Right: 12lbs; Left: 35 lbs, Lateral pinch: Right: 8 lbs, Left: 12 lbs, and 3 point pinch: Right: 5 lbs, Left: 12 lbs 07/24/23 Grip strength: Right: 16lbs; Left: 35 lbs, Lateral pinch: Right: 8 lbs, Left: 12 lbs, and 3 point pinch: Right: 5 lbs, Left: 12 lbs COORDINATION: Impaired because of increased swelling and pain in stiffness  SENSATION: Numbness in 2nd, 3rd and 4th digits with third throwers  EDEMA: edema on the right hand  COGNITION: Overall cognitive status: Within functional limits for tasks assessed  TREATMENT DATE: 07/28/23                                                                                                                           Cont tenderness at PIP of 3rd 5/10  Grip strength this date 19 pounds pain at the third digit Pain after putty in the palm and thumb [facility] Ed pt not to use thumb with gripping - focus on digits flexion  Simulated lifting job activities- 8 and 10 lbs bilateral hands but 15 lbs pain in palm and wrist   Pt to cont with buddy strap to use on 2nd and 3rd to encourage functional use of third digit during the day.  Patient educated in wearing.   Fluidotherapy Use of Fluidotherapy decrease stiffness and pain   8 min  -improve tissue mobility and motion prior to soft tissue and range of motion   Reviewed again wearing off knuckle bender splint with 4 bands each side for 2 times 2 min to increase MC flexion - prior to composite flexion active range of motion to bar.  Not forcing third digit Can do placing hold pain-free Educated patient on using knuckle bender MC flexion splint instead of composite passive range of motion decreasing pressure on the third PIP and decreasing pain over the next few days Patient has some pain in the third digit with putty. Patient to switch to foam block pain-free not using thumb and not forcing endrange. Can  switch to light blue putty if pain-free Patient continue with composite wrist flexion.  As well as table slides. Reviewed with patient 1 pound weight for radial ulnar deviation wrist flexion extension and supination pronation 2 sets of 12 pain-free.   Manual Therapy: manual therapy techniques by therapist for scar massage to palm and wrist.  Utilized cica care silicone pad over palm scar due to sensitivity and to tolerate  scar massage to this area.  Done mini massager  pt educated on massage for home program to perform daily 3 times a day and monitor tenderness. Rolling over red roller for palm , volar wrist and forearm - add AAROM for light hyper extention of digits.    Continue with Cica -Care scar pad at nighttime-provided new pieces as well as Tubigrip D  Isotoner glove at nighttime and as needed during the day     Therapeutic Exercises:  Patient to continue with prayer stretch for composite wrist extension  10 reps Cont  table slides for finger and wrist composite extension stretch 20 reps can do up to 3 times a day   Knuckle bender splint 2-3 min x 2 Prior to  composite flexion to palm Buddy strap add to 2nd and 3rd t digit to facilitate active assisted range of motion for composite flexion Teal medium putty for gripping as well as lateral pinch.  12 reps pain-free 2 times a day.  Patient can wear buddy strap with lateral pinch. Patient demonstrate decreased functional use of the third digit because of numbness and decree sensation-Buddy strap to facilitate increased functional use of third  Thumb palmar radial abduction, 12 reps Continue to work on opposition to all digits   Issued Cica care scar pads to wear at night to hydrate, soften scar and promote healing.  Pt has one for her palm, one for the wrist and an additional piece to place over palm area when performing scar massage due to increased sensitivity.      PATIENT EDUCATION: Education details: findings of eval and  HEP  Person educated: Patient Education method: Explanation, Demonstration, Tactile cues, Verbal cues, and Handouts Education comprehension: verbalized understanding, returned demonstration, verbal cues required, and needs further education    GOALS: Goals reviewed with patient? Yes   LONG TERM GOALS: Target date: 8 wks  Patient be independent in home program to decrease edema and pain for patient to increase digit flexion to touching palm. Baseline: Incision not closed.  Increased edema and pain increased to 8-10/10 in the third digit; MC flexion 45 to 60 degrees and PIP 70-90 Goal status: Met  2.  Wrist flexion extension on the right improved to within normal limits for patient to push and pull heavy door as well as turn doorknob without increased symptoms Baseline: Wrist flexion is 40 degrees and extension 70 with loose fist patient not using hand because of incision not closed Goal status: Progressing  3.  Incisional right hand improved for patient to initiate scar massage as well as being able to tolerate different textures including tapping and clapping hands with no increase symptoms Baseline: Patient report incision not closed since last time.  Steri-Strips came off and incision opening with digit extension.  Steri-Strips applied if not staying in place patient to contact orthopedics in the next 24 to 48 hours.  Keep home exercises without opening or putting stress on incision Goal status: Met  4.  Right grip and prehension strength improve to more than 75% compared to the left for patient to return to using hand normally at home and cooking and laundry and cleaning activities without increase symptoms Baseline: Patient is about 3 weeks postop.  Patient with increased edema and pain and stiffness.  Incision not closed-not tested Goal status: Progressing   ASSESSMENT:  CLINICAL IMPRESSION: Patient seen for occupational therapy for right arthroscopic carpal tunnel release and  third digit trigger finger release by Dr. Edie -patient had surgery 05/28/2023.  At evaluation patient arrived with incision open.  Patient appear to have scar adhesion limiting composite flexion especially third digit flexion and composite extension and pushing activities and weightbearing.  Patient continues to need passive range of motion for composite flexion especially third digit.  Observed patient not using third digit because of numbness and increased pins-and-needles.  Change home program since last time to use knuckle bender MC splint again prior to AROM  composite flexion to bar.  Great success in session after heat. And place and hold pain free  on 3rd PIP - hold off on putty - and not using thumb with gripping - can do gripping into the foam block.  And can try a do light blue putty again if pain-free in the third PIP-and then progress again to teal.  Hold off on passive range of motion for PIP flexion composite flexion causing pain at the PIP.  Continue to wear buddy strap for 2nd and 3rd to facilitate increased functional use of third digit and decreasing stiffness.  Pt to continue with contrast for edema control, ROM exercises and scar massage, to wear CICA care at night.  Patient to continue with Isotoner glove doing really well with edema.  Patient limited in functional use of right dominant hand in ADLs and IADLs.  Patient can benefit from skilled OT services to decrease edema, pain, scar tissue and increased motion and strength to return to prior level of function  PERFORMANCE DEFICITS: in functional skills including ADLs, IADLs, ROM, strength, pain, flexibility, decreased knowledge of use of DME, and UE functional use,   and psychosocial skills including environmental adaptation and routines and behaviors.   IMPAIRMENTS: are limiting patient from ADLs, IADLs, rest and sleep, play, leisure, and social participation.   COMORBIDITIES: has no other co-morbidities that affects occupational  performance. Patient will benefit from skilled OT to address above impairments and improve overall function.  MODIFICATION OR ASSISTANCE TO COMPLETE EVALUATION: No modification of tasks or assist necessary to complete an evaluation.  OT OCCUPATIONAL PROFILE AND HISTORY: Problem focused assessment: Including review of records relating to presenting problem.  CLINICAL DECISION MAKING: LOW - limited treatment options, no task modification necessary  REHAB POTENTIAL: Good for goals  EVALUATION COMPLEXITY: Low    PLAN:  OT FREQUENCY: 1-2x/week  OT DURATION: 8 weeks  PLANNED INTERVENTIONS: 97168 OT Re-evaluation, 97535 self care/ADL training, 02889 therapeutic exercise, 97530 therapeutic activity, 97140 manual therapy, 97018 paraffin, 02960 fluidotherapy, 97034 contrast bath, scar mobilization, passive range of motion, patient/family education, and DME and/or AE instructions  CONSULTED AND AGREED WITH PLAN OF CARE: Patient   Ancel Peters, OTR/L,CLT 07/28/2023, 4:01 PM

## 2023-08-05 ENCOUNTER — Ambulatory Visit: Attending: Student | Admitting: Occupational Therapy

## 2023-08-05 DIAGNOSIS — M6281 Muscle weakness (generalized): Secondary | ICD-10-CM | POA: Insufficient documentation

## 2023-08-05 DIAGNOSIS — M25641 Stiffness of right hand, not elsewhere classified: Secondary | ICD-10-CM | POA: Diagnosis present

## 2023-08-05 DIAGNOSIS — M25631 Stiffness of right wrist, not elsewhere classified: Secondary | ICD-10-CM | POA: Insufficient documentation

## 2023-08-05 DIAGNOSIS — L905 Scar conditions and fibrosis of skin: Secondary | ICD-10-CM | POA: Insufficient documentation

## 2023-08-05 DIAGNOSIS — R600 Localized edema: Secondary | ICD-10-CM | POA: Insufficient documentation

## 2023-08-05 DIAGNOSIS — M79641 Pain in right hand: Secondary | ICD-10-CM | POA: Diagnosis present

## 2023-08-08 ENCOUNTER — Ambulatory Visit: Admitting: Occupational Therapy

## 2023-08-09 ENCOUNTER — Encounter: Payer: Self-pay | Admitting: Occupational Therapy

## 2023-08-09 NOTE — Therapy (Signed)
 OUTPATIENT OCCUPATIONAL THERAPY ORTHO TREATMENT/RECERT  Patient Name: Taylor Valencia MRN: [id number] DOB:12-May-1970, 53 y.o., female Today's Date: 08/09/2023  PCP: Dr Sadie MART PROVIDER: Kip PA  END OF SESSION:  OT End of Session - 08/09/23 1615     Visit Number 13    Number of Visits 18    Date for OT Re-Evaluation 09/08/23    OT Start Time 0947    OT Stop Time 1040    OT Time Calculation (min) 53 min    Activity Tolerance Patient tolerated treatment well    Behavior During Therapy [facility] for tasks assessed/performed          Past Medical History:  Diagnosis Date   Arthritis    Chronic back pain    History of bronchitis    Hypertension    Pre-diabetes    Past Surgical History:  Procedure Laterality Date   BOWEL RESECTION     patient had some muscle removed    CARPAL TUNNEL RELEASE Right 05/28/2023   Procedure: RELEASE, CARPAL TUNNEL, ENDOSCOPIC;  Surgeon: Edie Norleen PARAS, MD;  Location: [facility] OR[facility];  Service: Orthopedics;  Laterality: Right;   CHOLECYSTECTOMY     COLONOSCOPY WITH PROPOFOL  N/A 03/11/2022   Procedure: COLONOSCOPY WITH PROPOFOL ;  Surgeon: Maryruth Ole DASEN, MD;  Location: [facility];  Service: Endoscopy;  Laterality: N/A;   EYE SURGERY     lasix surgery   LUMBAR LAMINECTOMY/DECOMPRESSION MICRODISCECTOMY N/A 08/24/2015   Procedure: Thoracic nine- ten, Thoracic ten- eleven Laminectomy;  Surgeon: Reyes Budge, MD;  Location: [facility];  Service: Neurosurgery;  Laterality: N/A;  T9-10, T10-11 Laminectomy   TONSILLECTOMY     TRIGGER FINGER RELEASE Right 05/28/2023   Procedure: RELEASE, A1 PULLEY, FOR TRIGGER FINGER;  Surgeon: Edie Norleen PARAS, MD;  Location: [facility] OR[facility];  Service: Orthopedics;  Laterality: Right;   Patient Active Problem List   Diagnosis Date Noted   Essential hypertension 12/22/2018   Morbid obesity (HCC) 12/22/2018   Depression 12/22/2018   Thoracic myelopathy 08/24/2015    ONSET DATE: 05/28/23  REFERRING DIAG: R  CTS and 3rd trigger finger release  THERAPY DIAG:  Stiffness of right hand, not elsewhere classified  Pain in right hand  Muscle weakness (generalized)  Stiffness of right wrist, not elsewhere classified  Localized edema  Scar condition and fibrosis of skin  Rationale for Evaluation and Treatment: Rehabilitation  SUBJECTIVE:   SUBJECTIVE STATEMENT: See below for subjective in today's treatment note Pt accompanied by: self  PERTINENT HISTORY: Impression: Carpal tunnel syndrome, right [G56.01] Carpal tunnel syndrome, right (primary encounter diagnosis) Trigger middle finger of right hand  Plan:  1. Treatment options were discussed today with the patient. 2. Sutures removed at today's visit, benzoin and Steri-Strips were applied. The patient was instructed that she may begin the shower at this time. 3. She was given hand exercises to perform at home at this time. She was instructed to begin massaging the incision sites later this week to decrease chances of scar tissue development. 4. The patient was instructed to continue to wear the Velcro wrist splint for 1 more week at night but she does not need to wear it during today. Gradually increase activities as tolerated to the right hand. 5. Given her limitations with flexion at today's visit, a referral for formal occupational therapy was placed to the patient. A work note was provided keeping the patient out until her next follow-up appointment. 6. The patient will follow-up in 1 month with Dr. Edie for repeat  evaluation of the right hand. They can call the clinic they have any questions, new symptoms develop or symptoms worsen.   PRECAUTIONS: None  WEIGHT BEARING RESTRICTIONS: No  PAIN:  Are you having pain?  3rd PIP 4-5/10 pain - tendernenss   FALLS: Has patient fallen in last 6 months? 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   In her free time doing things around the house  PATIENT GOALS: Get the scar better as well as her motion and strength to be able to do her job and things around the house  NEXT MD VISIT: 07/11/23  OBJECTIVE:  Note: Objective measures were completed at Evaluation unless otherwise noted.  HAND DOMINANCE: Right  ADLs: Patient limited in use of right dominant hand because of incision not closed and guarding as well as increased edema and decreased motion and increased pain   FUNCTIONAL OUTCOME MEASURES: Next session   UPPER EXTREMITY ROM:     Active ROM Right eval Left eval R 06/30/23 R 07/24/23  Shoulder flexion      Shoulder abduction      Shoulder adduction      Shoulder extension      Shoulder internal rotation      Shoulder external rotation      Elbow flexion      Elbow extension      Wrist flexion 40  42 65 70  Wrist extension 70 digits bend 66, digits slightly flexed 65 70  Wrist ulnar deviation      Wrist radial deviation      Wrist pronation      Wrist supination      (Blank rows = not tested)  Active ROM Right eval Left eval R 06/25/23 R  06/30/23 R 07/08/23 R 07/10/23 R 07/24/23  Thumb MCP (0-60)         Thumb IP (0-80)         Thumb Radial abd/add (0-55) 42    55     Thumb Palmar abd/add (0-45) 50    50     Thumb Opposition to Small Finger          Index MCP (0-90) 50   60 65 60 65 60  Index PIP (0-100) 70   85 95 95  90  Index DIP (0-70)           Long MCP (0-90) 45    55 60 60 70 60  Long PIP (0-100)  90   80 80 95  95  Long DIP (0-70)           Ring MCP (0-90) 60    60 60 60 70 65  Ring PIP (0-100) 80    80 95 100  100  Ring DIP (0-70)           Little MCP (0-90) 60    55 65 60 70 70  Little PIP (0-100) 80    80 85 95  100  Little DIP (0-70)           (Blank rows = not tested)   HAND FUNCTION:  Grip strength: Right: 10 lbs; Left: 35 lbs, Lateral pinch: Right: 8 lbs, Left: 12 lbs, and 3 point pinch: Right: 4 lbs, Left: 12 lbs 07/14/23 Grip  strength: Right: 12lbs; Left: 35 lbs, Lateral pinch: Right: 8 lbs, Left: 12 lbs, and 3 point pinch: Right: 5 lbs, Left: 12  lbs 07/24/23 Grip strength: Right: 16lbs; Left: 35 lbs, Lateral pinch: Right: 8 lbs, Left: 12 lbs, and 3 point pinch: Right: 5 lbs, Left: 12 lbs COORDINATION: Impaired because of increased swelling and pain in stiffness  SENSATION: Numbness in 2nd, 3rd and 4th digits with third throwers  EDEMA: edema on the right hand  COGNITION: Overall cognitive status: Within functional limits for tasks assessed  TREATMENT DATE: 08/05/23  Pt reports pain in her palm this date, 5/10.  She reports using knuckle bender at home with 8 bands total, 2 mins at a time and tolerating well.  Also using buddy strap on 2nd and 3rd during the day while performing tasks and using hand.   Fluidotherapy: Use of Fluidotherapy decrease stiffness, increase tissue mobility, ROM and decrease pain  8 min  Pt performing active ROM while in fluidotherapy.    Manual Therapy: Following fluidotherapy, manual therapy techniques by therapist for scar massage to palm and wrist.  Rolling over red roller for palm , volar wrist and forearm - add AAROM for light hyper extention of digits. Continue with Cica -Care scar pad at nighttime-provided new pieces as well as Tubigrip D Isotoner glove at nighttime and as needed during the day  Therapeutic Exercises:    Knuckle bender splint with 4 bands each side for 2 times 3 min to increase MC flexion - prior to composite flexion active range of motion. Not forcing third digit Trials of place and hold Use of foam block for grasping.  Light blue putty for working on strengthening for composite fisting.  Table slides for 10 reps Prayer stretch for composite wrist extension 10 reps Thumb palmar radial abduction, 12 reps Opposition to all digits wrist composite extension stretch 20 reps can do up to 3 times a day  1 pound weight for radial ulnar deviation wrist flexion  extension and supination pronation, she was only doing 1 set at home, increase to 2 sets of 12 this date.                                                                                                                       PATIENT EDUCATION: Education details: findings of eval and HEP  Person educated: Patient Education method: Explanation, Demonstration, Tactile cues, Verbal cues, and Handouts Education comprehension: verbalized understanding, returned demonstration, verbal cues required, and needs further education    GOALS: Goals reviewed with patient? Yes   LONG TERM GOALS: Target date: 8 wks  Patient be independent in home program to decrease edema and pain for patient to increase digit flexion to touching palm. Baseline: Incision not closed.  Increased edema and pain increased to 8-10/10 in the third digit; MC flexion 45 to 60 degrees and PIP 70-90 Goal status: Met  2.  Wrist flexion extension on the right improved to within normal limits for patient to push and pull heavy door as well as turn doorknob without increased symptoms Baseline: Wrist flexion is 40 degrees and extension 70 with loose fist  patient not using hand because of incision not closed Goal status: Progressing  3.  Incisional right hand improved for patient to initiate scar massage as well as being able to tolerate different textures including tapping and clapping hands with no increase symptoms Baseline: Patient report incision not closed since last time.  Steri-Strips came off and incision opening with digit extension.  Steri-Strips applied if not staying in place patient to contact orthopedics in the next 24 to 48 hours.  Keep home exercises without opening or putting stress on incision Goal status: Met  4.  Right grip and prehension strength improve to more than 75% compared to the left for patient to return to using hand normally at home and cooking and laundry and cleaning activities without increase  symptoms Baseline: Patient is about 3 weeks postop.  Patient with increased edema and pain and stiffness.  Incision not closed-not tested Goal status: Progressing   ASSESSMENT:  CLINICAL IMPRESSION: Patient seen for occupational therapy for right arthroscopic carpal tunnel release and third digit trigger finger release by Dr. Edie -patient had surgery 05/28/2023.  At evaluation patient arrived with incision open.  Patient appear to have scar adhesion limiting composite flexion especially third digit flexion and composite extension and pushing activities and weightbearing.  Patient continues to need passive range of motion for composite flexion especially third digit.  Observed patient not using third digit because of numbness and increased pins-and-needles.  Change home program since last time to use knuckle bender MC splint again prior to AROM  composite flexion to bar.  Great success in session after heat. Place and hold remain pain free on 3rd PIP.  Pt transitioned to foam block last time, away from teal putty.  She was able to progress back to the lighter blue putty, not yet back to teal. Pt to increase knucklebender to 3 mins for 2-3 times a day, able to tolerate well during session.   Continue to wear buddy strap for 2nd and 3rd to facilitate increased functional use of third digit and decreasing stiffness.  Pt to continue with contrast for edema control, ROM exercises and scar massage, to wear CICA care at night.  Patient to continue with Isotoner glove doing really well with edema.  Patient limited in functional use of right dominant hand in ADLs and IADLs.  Patient can benefit from skilled OT services to decrease edema, pain, scar tissue and increased motion and strength to return to prior level of function  PERFORMANCE DEFICITS: in functional skills including ADLs, IADLs, ROM, strength, pain, flexibility, decreased knowledge of use of DME, and UE functional use,   and psychosocial skills  including environmental adaptation and routines and behaviors.   IMPAIRMENTS: are limiting patient from ADLs, IADLs, rest and sleep, play, leisure, and social participation.   COMORBIDITIES: has no other co-morbidities that affects occupational performance. Patient will benefit from skilled OT to address above impairments and improve overall function.  MODIFICATION OR ASSISTANCE TO COMPLETE EVALUATION: No modification of tasks or assist necessary to complete an evaluation.  OT OCCUPATIONAL PROFILE AND HISTORY: Problem focused assessment: Including review of records relating to presenting problem.  CLINICAL DECISION MAKING: LOW - limited treatment options, no task modification necessary  REHAB POTENTIAL: Good for goals  EVALUATION COMPLEXITY: Low    PLAN:  OT FREQUENCY: 1-2x/week  OT DURATION: 8 weeks  PLANNED INTERVENTIONS: 97168 OT Re-evaluation, 97535 self care/ADL training, 02889 therapeutic exercise, 97530 therapeutic activity, 97140 manual therapy, 97018 paraffin, 02960 fluidotherapy, 97034 contrast bath, scar  mobilization, passive range of motion, patient/family education, and DME and/or AE instructions  CONSULTED AND AGREED WITH PLAN OF CARE: Patient   Nikoletta Varma, OTR/L,CLT 08/09/2023, 4:16 PM

## 2023-08-12 ENCOUNTER — Ambulatory Visit: Admitting: Occupational Therapy

## 2023-08-12 DIAGNOSIS — M25641 Stiffness of right hand, not elsewhere classified: Secondary | ICD-10-CM

## 2023-08-12 DIAGNOSIS — M6281 Muscle weakness (generalized): Secondary | ICD-10-CM

## 2023-08-12 DIAGNOSIS — L905 Scar conditions and fibrosis of skin: Secondary | ICD-10-CM

## 2023-08-12 DIAGNOSIS — M79641 Pain in right hand: Secondary | ICD-10-CM

## 2023-08-12 DIAGNOSIS — M25631 Stiffness of right wrist, not elsewhere classified: Secondary | ICD-10-CM

## 2023-08-12 DIAGNOSIS — R600 Localized edema: Secondary | ICD-10-CM

## 2023-08-15 ENCOUNTER — Encounter: Payer: Self-pay | Admitting: Occupational Therapy

## 2023-08-15 ENCOUNTER — Ambulatory Visit: Admitting: Occupational Therapy

## 2023-08-15 DIAGNOSIS — M79641 Pain in right hand: Secondary | ICD-10-CM

## 2023-08-15 DIAGNOSIS — M6281 Muscle weakness (generalized): Secondary | ICD-10-CM

## 2023-08-15 DIAGNOSIS — M25641 Stiffness of right hand, not elsewhere classified: Secondary | ICD-10-CM

## 2023-08-15 DIAGNOSIS — L905 Scar conditions and fibrosis of skin: Secondary | ICD-10-CM

## 2023-08-15 DIAGNOSIS — M25631 Stiffness of right wrist, not elsewhere classified: Secondary | ICD-10-CM

## 2023-08-15 DIAGNOSIS — R600 Localized edema: Secondary | ICD-10-CM

## 2023-08-15 NOTE — Therapy (Signed)
 OUTPATIENT OCCUPATIONAL THERAPY ORTHO TREATMENT   Patient Name: Taylor Valencia MRN: [id number] DOB:08/18/1970, 53 y.o., female Today's Date: 08/15/2023  PCP: Dr Sadie MART PROVIDER: Kip PA  END OF SESSION:  OT End of Session - 08/15/23 1142     Visit Number 15    Number of Visits 18    Date for OT Re-Evaluation 09/08/23    OT Start Time 1130    OT Stop Time 1208    OT Time Calculation (min) 38 min    Activity Tolerance Patient tolerated treatment well    Behavior During Therapy WFL for tasks assessed/performed          Past Medical History:  Diagnosis Date   Arthritis    Chronic back pain    History of bronchitis    Hypertension    Pre-diabetes    Past Surgical History:  Procedure Laterality Date   BOWEL RESECTION     patient had some muscle removed    CARPAL TUNNEL RELEASE Right 05/28/2023   Procedure: RELEASE, CARPAL TUNNEL, ENDOSCOPIC;  Surgeon: Edie Norleen PARAS, MD;  Location: [facility];  Service: Orthopedics;  Laterality: Right;   CHOLECYSTECTOMY     COLONOSCOPY WITH PROPOFOL  N/A 03/11/2022   Procedure: COLONOSCOPY WITH PROPOFOL ;  Surgeon: Maryruth Ole DASEN, MD;  Location: [facility];  Service: Endoscopy;  Laterality: N/A;   EYE SURGERY     lasix surgery   LUMBAR LAMINECTOMY/DECOMPRESSION MICRODISCECTOMY N/A 08/24/2015   Procedure: Thoracic nine- ten, Thoracic ten- eleven Laminectomy;  Surgeon: Reyes Budge, MD;  Location: [facility];  Service: Neurosurgery;  Laterality: N/A;  T9-10, T10-11 Laminectomy   TONSILLECTOMY     TRIGGER FINGER RELEASE Right 05/28/2023   Procedure: RELEASE, A1 PULLEY, FOR TRIGGER FINGER;  Surgeon: Edie Norleen PARAS, MD;  Location: [facility];  Service: Orthopedics;  Laterality: Right;   Patient Active Problem List   Diagnosis Date Noted   Essential hypertension 12/22/2018   Morbid obesity (HCC) 12/22/2018   Depression 12/22/2018   Thoracic myelopathy 08/24/2015    ONSET DATE: 05/28/23  REFERRING DIAG: R CTS and  3rd trigger finger release  THERAPY DIAG:  Stiffness of right hand, not elsewhere classified  Pain in right hand  Muscle weakness (generalized)  Stiffness of right wrist, not elsewhere classified  Localized edema  Scar condition and fibrosis of skin  Rationale for Evaluation and Treatment: Rehabilitation  SUBJECTIVE:   SUBJECTIVE STATEMENT: See below for subjective in today's treatment note Pt accompanied by: self  PERTINENT HISTORY: Impression: Carpal tunnel syndrome, right [G56.01] Carpal tunnel syndrome, right (primary encounter diagnosis) Trigger middle finger of right hand  Plan:  1. Treatment options were discussed today with the patient. 2. Sutures removed at today's visit, benzoin and Steri-Strips were applied. The patient was instructed that she may begin the shower at this time. 3. She was given hand exercises to perform at home at this time. She was instructed to begin massaging the incision sites later this week to decrease chances of scar tissue development. 4. The patient was instructed to continue to wear the Velcro wrist splint for 1 more week at night but she does not need to wear it during today. Gradually increase activities as tolerated to the right hand. 5. Given her limitations with flexion at today's visit, a referral for formal occupational therapy was placed to the patient. A work note was provided keeping the patient out until her next follow-up appointment. 6. The patient will follow-up in 1 month with Dr. Edie for  repeat evaluation of the right hand. They can call the clinic they have any questions, new symptoms develop or symptoms worsen.   PRECAUTIONS: None  WEIGHT BEARING RESTRICTIONS: No  PAIN:  Are you having pain?  3rd PIP 4-5/10 pain - tendernenss   FALLS: Has patient fallen in last 6 months? 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  In her  free time doing things around the house  PATIENT GOALS: Get the scar better as well as her motion and strength to be able to do her job and things around the house  NEXT MD VISIT: 07/11/23  OBJECTIVE:  Note: Objective measures were completed at Evaluation unless otherwise noted.  HAND DOMINANCE: Right  ADLs: Patient limited in use of right dominant hand because of incision not closed and guarding as well as increased edema and decreased motion and increased pain   FUNCTIONAL OUTCOME MEASURES: Next session   UPPER EXTREMITY ROM:     Active ROM Right eval Left eval R 06/30/23 R 07/24/23 R 08/15/23  Shoulder flexion       Shoulder abduction       Shoulder adduction       Shoulder extension       Shoulder internal rotation       Shoulder external rotation       Elbow flexion       Elbow extension       Wrist flexion 40  42 65 70 80  Wrist extension 70 digits bend 66, digits slightly flexed 65 70 70  Wrist ulnar deviation       Wrist radial deviation       Wrist pronation       Wrist supination       (Blank rows = not tested)  Active ROM Right eval Left eval R 06/25/23 R  06/30/23 R 07/08/23 R 07/10/23 R 07/24/23 R 08/14/23  Thumb MCP (0-60)          Thumb IP (0-80)          Thumb Radial abd/add (0-55) 42    55      Thumb Palmar abd/add (0-45) 50    50      Thumb Opposition to Small Finger           Index MCP (0-90) 50   60 65 60 65 60 70  Index PIP (0-100) 70   85 95 95  90 90  Index DIP (0-70)            Long MCP (0-90) 45    55 60 60 70 60 75  Long PIP (0-100)  90   80 80 95  95 95  Long DIP (0-70)            Ring MCP (0-90) 60    60 60 60 70 65 80  Ring PIP (0-100) 80    80 95 100  100 100  Ring DIP (0-70)            Little MCP (0-90) 60    55 65 60 70 70 80  Little PIP (0-100) 80    80 85 95  100 95  Little DIP (0-70)            (Blank rows = not tested)   HAND FUNCTION:  Grip strength: Right: 10 lbs; Left: 35 lbs, Lateral pinch: Right: 8 lbs, Left: 12 lbs,  and 3 point pinch: Right: 4 lbs, Left: 12 lbs 07/14/23 Grip strength: Right: 12lbs; Left: 35 lbs, Lateral pinch: Right: 8 lbs, Left: 12 lbs, and 3 point pinch: Right: 5 lbs, Left: 12 lbs 07/24/23 Grip strength: Right: 16lbs; Left: 35 lbs, Lateral pinch: Right: 8 lbs, Left: 12 lbs, and 3 point pinch: Right: 5 lbs, Left: 12 lbs 08/12/2023 Grip right 18#, lateral pinch 11#, 3 point pinch 7# 08/15/23 Grip strength: Right: 20lbs; Left: 35 lbs, Lateral pinch: Right: 12 lbs, Left: 12 lbs, and 3 point pinch: Right: 7 lbs, Left: 12 lbs COORDINATION: Impaired because of increased swelling and pain in stiffness  SENSATION: Numbness in 2nd, 3rd and 4th digits with third throwers  EDEMA: edema on the right hand  COGNITION: Overall cognitive status: Within functional limits for tasks assessed  TREATMENT DATE: 08/15/23  Pt reports she had bariatric surgery at the end of last week, she is moving slower today.  She reports she has tenderness around the small incisions in her abdomen but is doing well.  She reports she traveled to have her surgery performed and she did not have time to do her exercises for a few days while she was away.Did some of the HEP  Patient reports stiffness and pain in the morning over carpal tunnel incision and fingers.  But after washing some dishes in warm water and doing her range of motion exercises improving.   Arrived pain-free except over pisiform eye.  -Tenderness over Gyons canal 7/10 tenderness-do report some pins-and-needles in the fifth digit DIP  She continues to report numbness in middle phalanges as well as DIPs of 3rd and 4th digits great improvement in sensation over distal palm as well as proximal phalanges.  Assess active range of motion in digits with great progress.  See flowsheet As well as progress in grip strength and lateral pinch.  See flowsheet.  3 point limited by decreased sensation still at [facility]   She reports tolerating knuckle bender and states she no longer  feels a stretch.    Contrast done to right hand 2 minutes heat 1 minute cold 8 minutes total to decrease inflammation over the pisiform and the Guyons canal Patient with tenderness 7/10 coming in today.  As well as reports some numbness pins-and-needles in the DIP of the fifth Patient to do contrast at home to 3 times a day as well as some ice massage over the pisiform he   Manual Therapy: Held off today any scar massage soft tissue  Therapeutic Exercises:   Patient to continue with knuckle bender splint to increase MP flexion, added a 5th band to each side this date and able to wear for 3 mins without difficulty and feeling a slight pull. Place and hold with finger flexion, 5 trials  Thumb palmar radial abduction, 12 reps Opposition to all digits Patient to continue until next visit with lateral pinch and 3-point pinch and teal putty 15 reps 2 times a day Hold off on any gripping 1 pound weight only supination pronation. Patient to hold off on wrist flexion extension radial ulnar deviation.               C[employer] -Care scar pad at nighttime Isotoner glove at nighttime and as needed during the day  PATIENT EDUCATION: Education details: findings of eval and HEP  Person educated: Patient Education method: Explanation, Demonstration, Tactile cues, Verbal cues, and Handouts Education comprehension: verbalized understanding, returned demonstration, verbal cues required, and needs further education  GOALS: Goals reviewed with patient? Yes   LONG TERM GOALS: Target date: 8 wks  Patient be independent in home program to decrease edema and pain for patient to increase digit flexion to touching palm. Baseline: Incision not closed.  Increased edema and pain increased to 8-10/10 in the third digit; MC flexion 45 to 60 degrees and PIP 70-90 Goal status: Met  2.  Wrist flexion extension on the right improved to within normal limits for patient to push and pull heavy door as well as turn  doorknob without increased symptoms Baseline: Wrist flexion is 40 degrees and extension 70 with loose fist patient not using hand because of incision not closed Goal status: Progressing  3.  Incisional right hand improved for patient to initiate scar massage as well as being able to tolerate different textures including tapping and clapping hands with no increase symptoms Baseline: Patient report incision not closed since last time.  Steri-Strips came off and incision opening with digit extension.  Steri-Strips applied if not staying in place patient to contact orthopedics in the next 24 to 48 hours.  Keep home exercises without opening or putting stress on incision Goal status: Met  4.  Right grip and prehension strength improve to more than 75% compared to the left for patient to return to using hand normally at home and cooking and laundry and cleaning activities without increase symptoms Baseline: Patient is about 3 weeks postop.  Patient with increased edema and pain and stiffness.  Incision not closed-not tested Goal status: Progressing   ASSESSMENT:  CLINICAL IMPRESSION: Patient seen for occupational therapy for right arthroscopic carpal tunnel release and third digit trigger finger release by Dr. Edie -patient had surgery 05/28/2023.  Patient with great progress in digit flexion as well as grip and lateral pinch.  Sensation improving.  Numbness mostly in middle and distal phalanges of 3rd and 4th.  Patient do report tenderness 7/10 over the pisiform and the Guyons canal with some sensory changes in the DIP of the fifth.  Home program changed for patient to do contrast and some ice massage.  Hold off on any gripping with the putty as well as 1 pound weight.  Patient to focus on tendon glides as well as lateral and 3-point pinch using teal putty until next visit. Pt did undergo bariatric surgery at the end of last week and has 4 small incisions in abdominal region, she was not able to perform  her full scope of HEP during that time and could not take her knucklebender with her since she was traveling and did not want to take anything metal.  She is doing well and still able to progress with exercises.  Patient report increased functional use dropping objects less.  Patient limited in functional use of right dominant hand in ADLs and IADLs.  Patient can benefit from skilled OT services to decrease edema, pain, scar tissue and increased motion and strength to return to prior level of function  PERFORMANCE DEFICITS: in functional skills including ADLs, IADLs, ROM, strength, pain, flexibility, decreased knowledge of use of DME, and UE functional use,   and psychosocial skills including environmental adaptation and routines and behaviors.   IMPAIRMENTS: are limiting patient from ADLs, IADLs, rest and sleep, play, leisure, and social participation.   COMORBIDITIES: has no other co-morbidities that affects occupational performance. Patient will benefit from skilled OT to address above impairments and improve overall function.  MODIFICATION OR ASSISTANCE TO COMPLETE EVALUATION: No modification of tasks or assist necessary  to complete an evaluation.  OT OCCUPATIONAL PROFILE AND HISTORY: Problem focused assessment: Including review of records relating to presenting problem.  CLINICAL DECISION MAKING: LOW - limited treatment options, no task modification necessary  REHAB POTENTIAL: Good for goals  EVALUATION COMPLEXITY: Low    PLAN:  OT FREQUENCY: 1-2x/week  OT DURATION: 8 weeks  PLANNED INTERVENTIONS: 97168 OT Re-evaluation, 97535 self care/ADL training, 02889 therapeutic exercise, 97530 therapeutic activity, 97140 manual therapy, 97018 paraffin, 02960 fluidotherapy, 97034 contrast bath, scar mobilization, passive range of motion, patient/family education, and DME and/or AE instructions  CONSULTED AND AGREED WITH PLAN OF CARE: Patient   Ancel Peters, OTR/L,CLT 08/15/2023, 1:34  PM

## 2023-08-15 NOTE — Therapy (Signed)
 OUTPATIENT OCCUPATIONAL THERAPY ORTHO TREATMENT   Patient Name: Taylor Valencia MRN: [id number] DOB:May 11, 1970, 53 y.o., female Today's Date: 08/15/2023  PCP: Dr Sadie MART PROVIDER: Kip PA  END OF SESSION:  OT End of Session - 08/15/23 0749     Visit Number 14    Number of Visits 18    Date for OT Re-Evaluation 09/08/23    OT Start Time 1120    OT Stop Time 1208    OT Time Calculation (min) 48 min    Activity Tolerance Patient tolerated treatment well    Behavior During Therapy [facility] for tasks assessed/performed          Past Medical History:  Diagnosis Date   Arthritis    Chronic back pain    History of bronchitis    Hypertension    Pre-diabetes    Past Surgical History:  Procedure Laterality Date   BOWEL RESECTION     patient had some muscle removed    CARPAL TUNNEL RELEASE Right 05/28/2023   Procedure: RELEASE, CARPAL TUNNEL, ENDOSCOPIC;  Surgeon: Edie Norleen PARAS, MD;  Location: [facility] ORS;  Service: Orthopedics;  Laterality: Right;   CHOLECYSTECTOMY     COLONOSCOPY WITH PROPOFOL  N/A 03/11/2022   Procedure: COLONOSCOPY WITH PROPOFOL ;  Surgeon: Maryruth Ole DASEN, MD;  Location: [facility] ENDOSCOPY;  Service: Endoscopy;  Laterality: N/A;   EYE SURGERY     lasix surgery   LUMBAR LAMINECTOMY/DECOMPRESSION MICRODISCECTOMY N/A 08/24/2015   Procedure: Thoracic nine- ten, Thoracic ten- eleven Laminectomy;  Surgeon: Reyes Budge, MD;  Location: [facility];  Service: Neurosurgery;  Laterality: N/A;  T9-10, T10-11 Laminectomy   TONSILLECTOMY     TRIGGER FINGER RELEASE Right 05/28/2023   Procedure: RELEASE, A1 PULLEY, FOR TRIGGER FINGER;  Surgeon: Edie Norleen PARAS, MD;  Location: [facility] ORS;  Service: Orthopedics;  Laterality: Right;   Patient Active Problem List   Diagnosis Date Noted   Essential hypertension 12/22/2018   Morbid obesity (HCC) 12/22/2018   Depression 12/22/2018   Thoracic myelopathy 08/24/2015    ONSET DATE: 05/28/23  REFERRING DIAG: R CTS and  3rd trigger finger release  THERAPY DIAG:  Stiffness of right hand, not elsewhere classified  Pain in right hand  Muscle weakness (generalized)  Stiffness of right wrist, not elsewhere classified  Localized edema  Scar condition and fibrosis of skin  Rationale for Evaluation and Treatment: Rehabilitation  SUBJECTIVE:   SUBJECTIVE STATEMENT: See below for subjective in today's treatment note Pt accompanied by: self  PERTINENT HISTORY: Impression: Carpal tunnel syndrome, right [G56.01] Carpal tunnel syndrome, right (primary encounter diagnosis) Trigger middle finger of right hand  Plan:  1. Treatment options were discussed today with the patient. 2. Sutures removed at today's visit, benzoin and Steri-Strips were applied. The patient was instructed that she may begin the shower at this time. 3. She was given hand exercises to perform at home at this time. She was instructed to begin massaging the incision sites later this week to decrease chances of scar tissue development. 4. The patient was instructed to continue to wear the Velcro wrist splint for 1 more week at night but she does not need to wear it during today. Gradually increase activities as tolerated to the right hand. 5. Given her limitations with flexion at today's visit, a referral for formal occupational therapy was placed to the patient. A work note was provided keeping the patient out until her next follow-up appointment. 6. The patient will follow-up in 1 month with Dr. Edie for  repeat evaluation of the right hand. They can call the clinic they have any questions, new symptoms develop or symptoms worsen.   PRECAUTIONS: None  WEIGHT BEARING RESTRICTIONS: No  PAIN:  Are you having pain?  3rd PIP 4-5/10 pain - tendernenss   FALLS: Has patient fallen in last 6 months? 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  In her  free time doing things around the house  PATIENT GOALS: Get the scar better as well as her motion and strength to be able to do her job and things around the house  NEXT MD VISIT: 07/11/23  OBJECTIVE:  Note: Objective measures were completed at Evaluation unless otherwise noted.  HAND DOMINANCE: Right  ADLs: Patient limited in use of right dominant hand because of incision not closed and guarding as well as increased edema and decreased motion and increased pain   FUNCTIONAL OUTCOME MEASURES: Next session   UPPER EXTREMITY ROM:     Active ROM Right eval Left eval R 06/30/23 R 07/24/23  Shoulder flexion      Shoulder abduction      Shoulder adduction      Shoulder extension      Shoulder internal rotation      Shoulder external rotation      Elbow flexion      Elbow extension      Wrist flexion 40  42 65 70  Wrist extension 70 digits bend 66, digits slightly flexed 65 70  Wrist ulnar deviation      Wrist radial deviation      Wrist pronation      Wrist supination      (Blank rows = not tested)  Active ROM Right eval Left eval R 06/25/23 R  06/30/23 R 07/08/23 R 07/10/23 R 07/24/23  Thumb MCP (0-60)         Thumb IP (0-80)         Thumb Radial abd/add (0-55) 42    55     Thumb Palmar abd/add (0-45) 50    50     Thumb Opposition to Small Finger          Index MCP (0-90) 50   60 65 60 65 60  Index PIP (0-100) 70   85 95 95  90  Index DIP (0-70)           Long MCP (0-90) 45    55 60 60 70 60  Long PIP (0-100)  90   80 80 95  95  Long DIP (0-70)           Ring MCP (0-90) 60    60 60 60 70 65  Ring PIP (0-100) 80    80 95 100  100  Ring DIP (0-70)           Little MCP (0-90) 60    55 65 60 70 70  Little PIP (0-100) 80    80 85 95  100  Little DIP (0-70)           (Blank rows = not tested)   HAND FUNCTION:  Grip strength: Right: 10 lbs; Left: 35 lbs, Lateral pinch: Right: 8 lbs, Left: 12 lbs, and 3 point pinch: Right: 4 lbs, Left: 12 lbs 07/14/23 Grip strength:  Right: 12lbs; Left: 35 lbs, Lateral pinch: Right: 8 lbs, Left: 12 lbs, and 3 point pinch: Right: 5 lbs, Left:  12 lbs 07/24/23 Grip strength: Right: 16lbs; Left: 35 lbs, Lateral pinch: Right: 8 lbs, Left: 12 lbs, and 3 point pinch: Right: 5 lbs, Left: 12 lbs 08/12/2023 Grip right 18#, lateral pinch 11#, 3 point pinch 7# COORDINATION: Impaired because of increased swelling and pain in stiffness  SENSATION: Numbness in 2nd, 3rd and 4th digits with third throwers  EDEMA: edema on the right hand  COGNITION: Overall cognitive status: Within functional limits for tasks assessed  TREATMENT DATE: 08/12/23  Pt reports she had bariatric surgery at the end of last week, she is moving slower today.  She reports she has tenderness around the small incisions in her abdomen but is doing well.  She reports she traveled to have her surgery performed and she did not have time to do her exercises for a few days while she was away.  She returned yesterday and did do a few exercises last night.  Pain in hand is 3/10 this date, PIP pain with flexion.  She continues to report numbness in tips of the MF, and RF.  She feels the index finger is improved with sensation. She reports tolerating knuckle bender and states she no longer feels a stretch.     Fluidotherapy: Use of Fluidotherapy decrease stiffness, increase tissue mobility, ROM and decrease pain  8 min  Pt performing active ROM while in fluidotherapy.    Manual Therapy: Following fluidotherapy, manual therapy techniques performed by therapist for soft tissue mobilization and scar massage to palm and wrist.  Rolling over red roller for palm , volar wrist and forearm  AAROM for light hyper extension of digits.   Therapeutic Exercises:    Knuckle bender splint to increase MP flexion, added a 5th band to each side this date and able to wear for 3 mins without difficulty and feeling a slight pull. Place and hold with finger flexion, 5 trials Use of foam block  for grasping Table slides for 10 reps Prayer stretch for composite wrist extension 10 reps Thumb palmar radial abduction, 12 reps Opposition to all digits wrist composite extension stretch 20 reps can do up to 3 times a day Reassessed grip 18#, lateral pinch 11# and 3 point pinch 7#  Light blue putty for working on strengthening for composite fisting 1 pound weight for radial ulnar deviation wrist flexion extension and supination pronation, progressed to 2 sets last week, will  continue towards 3 sets this week.                                                                                                                   Cica -Care scar pad at nighttime Isotoner glove at nighttime and as needed during the day  PATIENT EDUCATION: Education details: findings of eval and HEP  Person educated: Patient Education method: Explanation, Demonstration, Tactile cues, Verbal cues, and Handouts Education comprehension: verbalized understanding, returned demonstration, verbal cues required, and needs further education    GOALS: Goals reviewed with patient? Yes   LONG TERM GOALS: Target date: 8  wks  Patient be independent in home program to decrease edema and pain for patient to increase digit flexion to touching palm. Baseline: Incision not closed.  Increased edema and pain increased to 8-10/10 in the third digit; MC flexion 45 to 60 degrees and PIP 70-90 Goal status: Met  2.  Wrist flexion extension on the right improved to within normal limits for patient to push and pull heavy door as well as turn doorknob without increased symptoms Baseline: Wrist flexion is 40 degrees and extension 70 with loose fist patient not using hand because of incision not closed Goal status: Progressing  3.  Incisional right hand improved for patient to initiate scar massage as well as being able to tolerate different textures including tapping and clapping hands with no increase symptoms Baseline: Patient  report incision not closed since last time.  Steri-Strips came off and incision opening with digit extension.  Steri-Strips applied if not staying in place patient to contact orthopedics in the next 24 to 48 hours.  Keep home exercises without opening or putting stress on incision Goal status: Met  4.  Right grip and prehension strength improve to more than 75% compared to the left for patient to return to using hand normally at home and cooking and laundry and cleaning activities without increase symptoms Baseline: Patient is about 3 weeks postop.  Patient with increased edema and pain and stiffness.  Incision not closed-not tested Goal status: Progressing   ASSESSMENT:  CLINICAL IMPRESSION: Patient seen for occupational therapy for right arthroscopic carpal tunnel release and third digit trigger finger release by Dr. Edie -patient had surgery 05/28/2023.  At evaluation patient arrived with incision open.  Patient appear to have scar adhesion limiting composite flexion especially third digit flexion and composite extension and pushing activities and weightbearing.  Patient continues to need passive range of motion for composite flexion especially third digit.  Observed patient not using third digit because of numbness and increased pins-and-needles.  Change home program since last time to use knuckle bender MC splint again prior to AROM  composite flexion to bar. Pt transitioned to foam block last time and still performing light blue putty, will continue to assess to progress to teal putty when able to tolerate. Pt to increase knucklebender to 3 mins for 2-3 times a day, able to tolerate well during session   and added an additional band this date, 5 on each side. Continue to wear buddy strap for 2nd and 3rd to facilitate increased functional use of third digit and decreasing stiffness.  Pt to continue with contrast and compression glove for edema control, ROM exercises and scar massage, to wear CICA  care at night. Pt did undergo bariatric surgery at the end of last week and has 4 small incisions in abdominal region, she was not able to perform her full scope of HEP during that time and could not take her knucklebender with her since she was traveling and did not want to take anything metal.  She is doing well and still able to progress with exercises, pain down to a 3/10 this date. Grip and pinch reassessed and demonstrates improvements in strength.  Patient limited in functional use of right dominant hand in ADLs and IADLs.  Patient can benefit from skilled OT services to decrease edema, pain, scar tissue and increased motion and strength to return to prior level of function  PERFORMANCE DEFICITS: in functional skills including ADLs, IADLs, ROM, strength, pain, flexibility, decreased knowledge of use of DME,  and UE functional use,   and psychosocial skills including environmental adaptation and routines and behaviors.   IMPAIRMENTS: are limiting patient from ADLs, IADLs, rest and sleep, play, leisure, and social participation.   COMORBIDITIES: has no other co-morbidities that affects occupational performance. Patient will benefit from skilled OT to address above impairments and improve overall function.  MODIFICATION OR ASSISTANCE TO COMPLETE EVALUATION: No modification of tasks or assist necessary to complete an evaluation.  OT OCCUPATIONAL PROFILE AND HISTORY: Problem focused assessment: Including review of records relating to presenting problem.  CLINICAL DECISION MAKING: LOW - limited treatment options, no task modification necessary  REHAB POTENTIAL: Good for goals  EVALUATION COMPLEXITY: Low    PLAN:  OT FREQUENCY: 1-2x/week  OT DURATION: 8 weeks  PLANNED INTERVENTIONS: 97168 OT Re-evaluation, 97535 self care/ADL training, 02889 therapeutic exercise, 97530 therapeutic activity, 97140 manual therapy, 97018 paraffin, 02960 fluidotherapy, 97034 contrast bath, scar mobilization,  passive range of motion, patient/family education, and DME and/or AE instructions  CONSULTED AND AGREED WITH PLAN OF CARE: Patient   Marites Nath, OTR/L,CLT 08/15/2023, 7:50 AM

## 2023-08-18 ENCOUNTER — Ambulatory Visit: Admitting: Occupational Therapy

## 2023-08-18 DIAGNOSIS — M79641 Pain in right hand: Secondary | ICD-10-CM

## 2023-08-18 DIAGNOSIS — R600 Localized edema: Secondary | ICD-10-CM

## 2023-08-18 DIAGNOSIS — M25631 Stiffness of right wrist, not elsewhere classified: Secondary | ICD-10-CM

## 2023-08-18 DIAGNOSIS — M25641 Stiffness of right hand, not elsewhere classified: Secondary | ICD-10-CM

## 2023-08-18 DIAGNOSIS — M6281 Muscle weakness (generalized): Secondary | ICD-10-CM

## 2023-08-18 DIAGNOSIS — L905 Scar conditions and fibrosis of skin: Secondary | ICD-10-CM

## 2023-08-18 NOTE — Therapy (Signed)
 OUTPATIENT OCCUPATIONAL THERAPY ORTHO TREATMENT   Patient Name: Taylor Valencia MRN: [id number] DOB:January 08, 1971, 53 y.o., female Today's Date: 08/18/2023  PCP: Dr Sadie MART PROVIDER: Kip PA  END OF SESSION:  OT End of Session - 08/18/23 1052     Visit Number 16    Number of Visits 18    Date for OT Re-Evaluation 09/08/23    OT Start Time 1031    OT Stop Time 1120    OT Time Calculation (min) 49 min    Activity Tolerance Patient tolerated treatment well    Behavior During Therapy WFL for tasks assessed/performed           Past Medical History:  Diagnosis Date   Arthritis    Chronic back pain    History of bronchitis    Hypertension    Pre-diabetes    Past Surgical History:  Procedure Laterality Date   BOWEL RESECTION     patient had some muscle removed    CARPAL TUNNEL RELEASE Right 05/28/2023   Procedure: RELEASE, CARPAL TUNNEL, ENDOSCOPIC;  Surgeon: Edie Norleen PARAS, MD;  Location: [facility];  Service: Orthopedics;  Laterality: Right;   CHOLECYSTECTOMY     COLONOSCOPY WITH PROPOFOL  N/A 03/11/2022   Procedure: COLONOSCOPY WITH PROPOFOL ;  Surgeon: Maryruth Ole DASEN, MD;  Location: [facility];  Service: Endoscopy;  Laterality: N/A;   EYE SURGERY     lasix surgery   LUMBAR LAMINECTOMY/DECOMPRESSION MICRODISCECTOMY N/A 08/24/2015   Procedure: Thoracic nine- ten, Thoracic ten- eleven Laminectomy;  Surgeon: Reyes Budge, MD;  Location: [facility];  Service: Neurosurgery;  Laterality: N/A;  T9-10, T10-11 Laminectomy   TONSILLECTOMY     TRIGGER FINGER RELEASE Right 05/28/2023   Procedure: RELEASE, A1 PULLEY, FOR TRIGGER FINGER;  Surgeon: Edie Norleen PARAS, MD;  Location: [facility];  Service: Orthopedics;  Laterality: Right;   Patient Active Problem List   Diagnosis Date Noted   Essential hypertension 12/22/2018   Morbid obesity (HCC) 12/22/2018   Depression 12/22/2018   Thoracic myelopathy 08/24/2015    ONSET DATE: 05/28/23  REFERRING DIAG: R CTS  and 3rd trigger finger release  THERAPY DIAG:  Stiffness of right hand, not elsewhere classified  Pain in right hand  Muscle weakness (generalized)  Stiffness of right wrist, not elsewhere classified  Localized edema  Scar condition and fibrosis of skin  Rationale for Evaluation and Treatment: Rehabilitation  SUBJECTIVE:   SUBJECTIVE STATEMENT: See below for subjective in today's treatment note Pt accompanied by: self  PERTINENT HISTORY: Impression: Carpal tunnel syndrome, right [G56.01] Carpal tunnel syndrome, right (primary encounter diagnosis) Trigger middle finger of right hand  Plan:  1. Treatment options were discussed today with the patient. 2. Sutures removed at today's visit, benzoin and Steri-Strips were applied. The patient was instructed that she may begin the shower at this time. 3. She was given hand exercises to perform at home at this time. She was instructed to begin massaging the incision sites later this week to decrease chances of scar tissue development. 4. The patient was instructed to continue to wear the Velcro wrist splint for 1 more week at night but she does not need to wear it during today. Gradually increase activities as tolerated to the right hand. 5. Given her limitations with flexion at today's visit, a referral for formal occupational therapy was placed to the patient. A work note was provided keeping the patient out until her next follow-up appointment. 6. The patient will follow-up in 1 month with Dr. Edie  for repeat evaluation of the right hand. They can call the clinic they have any questions, new symptoms develop or symptoms worsen.   PRECAUTIONS: None  WEIGHT BEARING RESTRICTIONS: No  PAIN:  Are you having pain?  3rd PIP 4-5/10 pain - tendernenss   FALLS: Has patient fallen in last 6 months? 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  In  her free time doing things around the house  PATIENT GOALS: Get the scar better as well as her motion and strength to be able to do her job and things around the house  NEXT MD VISIT: 07/11/23  OBJECTIVE:  Note: Objective measures were completed at Evaluation unless otherwise noted.  HAND DOMINANCE: Right  ADLs: Patient limited in use of right dominant hand because of incision not closed and guarding as well as increased edema and decreased motion and increased pain   FUNCTIONAL OUTCOME MEASURES: Next session   UPPER EXTREMITY ROM:     Active ROM Right eval Left eval R 06/30/23 R 07/24/23 R 08/15/23  Shoulder flexion       Shoulder abduction       Shoulder adduction       Shoulder extension       Shoulder internal rotation       Shoulder external rotation       Elbow flexion       Elbow extension       Wrist flexion 40  42 65 70 80  Wrist extension 70 digits bend 66, digits slightly flexed 65 70 70  Wrist ulnar deviation       Wrist radial deviation       Wrist pronation       Wrist supination       (Blank rows = not tested)  Active ROM Right eval Left eval R 06/25/23 R  06/30/23 R 07/08/23 R 07/10/23 R 07/24/23 R 08/14/23  Thumb MCP (0-60)          Thumb IP (0-80)          Thumb Radial abd/add (0-55) 42    55      Thumb Palmar abd/add (0-45) 50    50      Thumb Opposition to Small Finger           Index MCP (0-90) 50   60 65 60 65 60 70  Index PIP (0-100) 70   85 95 95  90 90  Index DIP (0-70)            Long MCP (0-90) 45    55 60 60 70 60 75  Long PIP (0-100)  90   80 80 95  95 95  Long DIP (0-70)            Ring MCP (0-90) 60    60 60 60 70 65 80  Ring PIP (0-100) 80    80 95 100  100 100  Ring DIP (0-70)            Little MCP (0-90) 60    55 65 60 70 70 80  Little PIP (0-100) 80    80 85 95  100 95  Little DIP (0-70)            (Blank rows = not tested)   HAND FUNCTION:  Grip strength: Right: 10 lbs; Left: 35 lbs, Lateral pinch: Right: 8 lbs, Left: 12  lbs, and 3 point pinch: Right: 4 lbs, Left: 12 lbs 07/14/23 Grip strength: Right: 12lbs; Left: 35 lbs, Lateral pinch: Right: 8 lbs, Left: 12 lbs, and 3 point pinch: Right: 5 lbs, Left: 12 lbs 07/24/23 Grip strength: Right: 16lbs; Left: 35 lbs, Lateral pinch: Right: 8 lbs, Left: 12 lbs, and 3 point pinch: Right: 5 lbs, Left: 12 lbs 08/12/2023 Grip right 18#, lateral pinch 11#, 3 point pinch 7# 08/15/23 Grip strength: Right: 20lbs; Left: 35 lbs, Lateral pinch: Right: 12 lbs, Left: 12 lbs, and 3 point pinch: Right: 7 lbs, Left: 12 lbs 08/18/23 Grip strength: Right: 22lbs; Left: 35 lbs, Lateral pinch: Right: 12 lbs, Left: 12 lbs, and 3 point pinch: Right: 9 lbs, Left: 12 lbs COORDINATION: Impaired because of increased swelling and pain in stiffness  SENSATION: Numbness in 2nd, 3rd and 4th digits with third throwers  EDEMA: edema on the right hand  COGNITION: Overall cognitive status: Within functional limits for tasks assessed  TREATMENT DATE: 08/18/23  Pt reports she had bariatric surgery on the 11th July, she is moving slower .  She reports she has tenderness around the small incisions in her abdomen but is doing well.  She reports she traveled to have her surgery.  Patient reports stiffness and pain at 3rd PIP in the morning  and over carpal tunnel incision.  But after washing some dishes in warm water and doing her range of motion exercises improving.    Patient continues to have tenderness over Gyons canal 5/10 tenderness-do report some pins-and-needles in the fifth digit DIP still  She continues to report numbness in middle phalanges as well as DIPs of 3rd and 4th digits great improvement in sensation over distal palm as well as proximal phalanges.  On [employer] patient tested normal sensation.  In all digits.  To the DIPs. Continues to show great progress in active range of motion in digits.  See flowsheet Patient also increased in grip and prehension strength since last week.  Without  even doing putty exercises. Focus on decreasing tenderness over pisiform he   Contrast done to right hand 2 minutes heat 1 minute cold 8 minutes total to decrease inflammation over the pisiform and the Guyons canal Patient with tenderness 5/10 coming in today.  End of last week was 7/10 -continues to report  some numbness pins-and-needles in the DIP of the fifth Patient to do contrast at home to 3 times a day as well as some ice massage over the pisiform he   Manual Therapy: Graston tool #6 done some brushing and sweeping over volar digits and palm prior to range of motion to increase flexibility decrease stiffness tightness  Therapeutic Exercises:   Patient to continue with tendon glides Thumb palmar radial abduction, 12 reps Opposition to all digits Patient to continue until next visit with lateral pinch and 3-point pinch with teal putty 15 reps 2 times a day Continue to hold off  on any gripping 1 pound weight only supination pronation. Reviewed and added to home program medial nerve glide and ulnar nerve glide 5 reps 2 times a day at the end of home exercises     Ultrasound done at 3.3 MHz at 20% at 1.0 intensity over pisiform area end of session to decrease inflammation and pain Cica -Care scar pad at nighttime Isotoner glove at nighttime and as needed during the day  PATIENT EDUCATION: Education details: findings of eval and HEP  Person educated: Patient Education method: Explanation, Demonstration, Tactile cues, Verbal  cues, and Handouts Education comprehension: verbalized understanding, returned demonstration, verbal cues required, and needs further education    GOALS: Goals reviewed with patient? Yes   LONG TERM GOALS: Target date: 8 wks  Patient be independent in home program to decrease edema and pain for patient to increase digit flexion to touching palm. Baseline: Incision not closed.  Increased edema and pain increased to 8-10/10 in the third digit; MC flexion 45  to 60 degrees and PIP 70-90 Goal status: Met  2.  Wrist flexion extension on the right improved to within normal limits for patient to push and pull heavy door as well as turn doorknob without increased symptoms Baseline: Wrist flexion is 40 degrees and extension 70 with loose fist patient not using hand because of incision not closed Goal status: Progressing  3.  Incisional right hand improved for patient to initiate scar massage as well as being able to tolerate different textures including tapping and clapping hands with no increase symptoms Baseline: Patient report incision not closed since last time.  Steri-Strips came off and incision opening with digit extension.  Steri-Strips applied if not staying in place patient to contact orthopedics in the next 24 to 48 hours.  Keep home exercises without opening or putting stress on incision Goal status: Met  4.  Right grip and prehension strength improve to more than 75% compared to the left for patient to return to using hand normally at home and cooking and laundry and cleaning activities without increase symptoms Baseline: Patient is about 3 weeks postop.  Patient with increased edema and pain and stiffness.  Incision not closed-not tested Goal status: Progressing   ASSESSMENT:  CLINICAL IMPRESSION: Patient seen for occupational therapy for right arthroscopic carpal tunnel release and third digit trigger finger release by Dr. Edie -patient had surgery 05/28/2023.  Patient making great progress in active range of motion of all digits.  As well as grip and prehension strength.  Sensation improving.  Patient tested normal on [employer] although reports diminished sensation in middle and distal phalanges of 3rd and 4th.   Patient do report tenderness 7/10 over the pisiform and the Guyons canal last week -improved to 5/10 this week after holding off on gripping activities -report some sensory changes in the DIP of the fifth.  Home program  changed for patient to do contrast and some ice massage.  Hold off on any gripping with the putty as well as 1 pound weight.  Patient to focus on tendon glides as well as lateral and 3-point pinch using teal putty until next visit. Pt did undergo bariatric surgery at the end of last week and has 4 small incisions in abdominal region, she was not able to perform her full scope of HEP during that time and could not take her knucklebender with her since she was traveling and did not want to take anything metal.  She is doing well and still able to progress with exercises.  Patient report increased functional use dropping objects less.  Patient limited in functional use of right dominant hand in ADLs and IADLs.  Patient can benefit from skilled OT services to decrease edema, pain, scar tissue and increased motion and strength to return to prior level of function  PERFORMANCE DEFICITS: in functional skills including ADLs, IADLs, ROM, strength, pain, flexibility, decreased knowledge of use of DME, and UE functional use,   and psychosocial skills including environmental adaptation and routines and behaviors.   IMPAIRMENTS: are limiting patient from ADLs, IADLs,  rest and sleep, play, leisure, and social participation.   COMORBIDITIES: has no other co-morbidities that affects occupational performance. Patient will benefit from skilled OT to address above impairments and improve overall function.  MODIFICATION OR ASSISTANCE TO COMPLETE EVALUATION: No modification of tasks or assist necessary to complete an evaluation.  OT OCCUPATIONAL PROFILE AND HISTORY: Problem focused assessment: Including review of records relating to presenting problem.  CLINICAL DECISION MAKING: LOW - limited treatment options, no task modification necessary  REHAB POTENTIAL: Good for goals  EVALUATION COMPLEXITY: Low    PLAN:  OT FREQUENCY: 1-2x/week  OT DURATION: 8 weeks  PLANNED INTERVENTIONS: 97168 OT Re-evaluation, 97535  self care/ADL training, 02889 therapeutic exercise, 97530 therapeutic activity, 97140 manual therapy, 97018 paraffin, 02960 fluidotherapy, 97034 contrast bath, scar mobilization, passive range of motion, patient/family education, and DME and/or AE instructions  CONSULTED AND AGREED WITH PLAN OF CARE: Patient   Ancel Peters, OTR/L,CLT 08/18/2023, 1:21 PM

## 2023-08-21 ENCOUNTER — Ambulatory Visit: Admitting: Occupational Therapy

## 2023-08-21 DIAGNOSIS — M25641 Stiffness of right hand, not elsewhere classified: Secondary | ICD-10-CM | POA: Diagnosis not present

## 2023-08-21 DIAGNOSIS — M79641 Pain in right hand: Secondary | ICD-10-CM

## 2023-08-21 DIAGNOSIS — L905 Scar conditions and fibrosis of skin: Secondary | ICD-10-CM

## 2023-08-21 DIAGNOSIS — M6281 Muscle weakness (generalized): Secondary | ICD-10-CM

## 2023-08-21 DIAGNOSIS — R600 Localized edema: Secondary | ICD-10-CM

## 2023-08-21 DIAGNOSIS — M25631 Stiffness of right wrist, not elsewhere classified: Secondary | ICD-10-CM

## 2023-08-21 NOTE — Therapy (Signed)
 OUTPATIENT OCCUPATIONAL THERAPY ORTHO TREATMENT   Patient Name: Taylor Valencia MRN: [id number] DOB:[birth year], 53 y.o., female Today's Date: 08/21/2023  PCP: Dr Sadie MART PROVIDER: Kip PA  END OF SESSION:  OT End of Session - 08/21/23 1041     Visit Number 17    Number of Visits 18    Date for OT Re-Evaluation 09/08/23    OT Start Time 1041    OT Stop Time 1120    OT Time Calculation (min) 39 min    Activity Tolerance Patient tolerated treatment well    Behavior During Therapy WFL for tasks assessed/performed           Past Medical History:  Diagnosis Date   Arthritis    Chronic back pain    History of bronchitis    Hypertension    Pre-diabetes    Past Surgical History:  Procedure Laterality Date   BOWEL RESECTION     patient had some muscle removed    CARPAL TUNNEL RELEASE Right 05/28/2023   Procedure: RELEASE, CARPAL TUNNEL, ENDOSCOPIC;  Surgeon: Edie Norleen PARAS, MD;  Location: [facility];  Service: Orthopedics;  Laterality: Right;   CHOLECYSTECTOMY     COLONOSCOPY WITH PROPOFOL  N/A 03/11/2022   Procedure: COLONOSCOPY WITH PROPOFOL ;  Surgeon: Maryruth Ole DASEN, MD;  Location: [facility];  Service: Endoscopy;  Laterality: N/A;   EYE SURGERY     lasix surgery   LUMBAR LAMINECTOMY/DECOMPRESSION MICRODISCECTOMY N/A 08/24/2015   Procedure: Thoracic nine- ten, Thoracic ten- eleven Laminectomy;  Surgeon: Reyes Budge, MD;  Location: [facility];  Service: Neurosurgery;  Laterality: N/A;  T9-10, T10-11 Laminectomy   TONSILLECTOMY     TRIGGER FINGER RELEASE Right 05/28/2023   Procedure: RELEASE, A1 PULLEY, FOR TRIGGER FINGER;  Surgeon: Edie Norleen PARAS, MD;  Location: [facility];  Service: Orthopedics;  Laterality: Right;   Patient Active Problem List   Diagnosis Date Noted   Essential hypertension 12/22/2018   Morbid obesity (HCC) 12/22/2018   Depression 12/22/2018   Thoracic myelopathy 08/24/2015    ONSET DATE: 05/28/23  REFERRING DIAG: R CTS  and 3rd trigger finger release  THERAPY DIAG:  Stiffness of right hand, not elsewhere classified  Pain in right hand  Muscle weakness (generalized)  Stiffness of right wrist, not elsewhere classified  Localized edema  Scar condition and fibrosis of skin  Rationale for Evaluation and Treatment: Rehabilitation  SUBJECTIVE:   SUBJECTIVE STATEMENT: I was able to start eating some eggs and out meal.  Hand feels good.  Just not strong.  About middle finger middle joint hurts at times when a grip.  In the mornings my hand is stiff. Pt accompanied by: self  PERTINENT HISTORY: Impression: Carpal tunnel syndrome, right [G56.01] Carpal tunnel syndrome, right (primary encounter diagnosis) Trigger middle finger of right hand  Plan:  1. Treatment options were discussed today with the patient. 2. Sutures removed at today's visit, benzoin and Steri-Strips were applied. The patient was instructed that she may begin the shower at this time. 3. She was given hand exercises to perform at home at this time. She was instructed to begin massaging the incision sites later this week to decrease chances of scar tissue development. 4. The patient was instructed to continue to wear the Velcro wrist splint for 1 more week at night but she does not need to wear it during today. Gradually increase activities as tolerated to the right hand. 5. Given her limitations with flexion at today's visit, a referral for formal occupational  therapy was placed to the patient. A work note was provided keeping the patient out until her next follow-up appointment. 6. The patient will follow-up in 1 month with Dr. Edie for repeat evaluation of the right hand. They can call the clinic they have any questions, new symptoms develop or symptoms worsen.   PRECAUTIONS: None  WEIGHT BEARING RESTRICTIONS: No  PAIN:  Are you having pain?  3rd PIP 4-5/10 pain - tendernenss and with composite tight grip  FALLS: Has patient fallen  in last 6 months? 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  In her free time doing things around the house  PATIENT GOALS: Get the scar better as well as her motion and strength to be able to do her job and things around the house  NEXT MD VISIT: 07/11/23  OBJECTIVE:  Note: Objective measures were completed at Evaluation unless otherwise noted.  HAND DOMINANCE: Right  ADLs: Patient limited in use of right dominant hand because of incision not closed and guarding as well as increased edema and decreased motion and increased pain   FUNCTIONAL OUTCOME MEASURES: Next session   UPPER EXTREMITY ROM:     Active ROM Right eval Left eval R 06/30/23 R 07/24/23 R 08/15/23  Shoulder flexion       Shoulder abduction       Shoulder adduction       Shoulder extension       Shoulder internal rotation       Shoulder external rotation       Elbow flexion       Elbow extension       Wrist flexion 40  42 65 70 80  Wrist extension 70 digits bend 66, digits slightly flexed 65 70 70  Wrist ulnar deviation       Wrist radial deviation       Wrist pronation       Wrist supination       (Blank rows = not tested)  Active ROM Right eval Left eval R 06/25/23 R  06/30/23 R 07/08/23 R 07/10/23 R 07/24/23 R 08/14/23  Thumb MCP (0-60)          Thumb IP (0-80)          Thumb Radial abd/add (0-55) 42    55      Thumb Palmar abd/add (0-45) 50    50      Thumb Opposition to Small Finger           Index MCP (0-90) 50   60 65 60 65 60 70  Index PIP (0-100) 70   85 95 95  90 90  Index DIP (0-70)            Long MCP (0-90) 45    55 60 60 70 60 75  Long PIP (0-100)  90   80 80 95  95 95  Long DIP (0-70)            Ring MCP (0-90) 60    60 60 60 70 65 80  Ring PIP (0-100) 80    80 95 100  100 100  Ring DIP (0-70)            Little MCP (0-90) 60    55 65 60 70 70 80  Little PIP (0-100) 80    80 85 95  100 95   Little DIP (0-70)            (  Blank rows = not tested)   HAND FUNCTION:  Grip strength: Right: 10 lbs; Left: 35 lbs, Lateral pinch: Right: 8 lbs, Left: 12 lbs, and 3 point pinch: Right: 4 lbs, Left: 12 lbs 07/14/23 Grip strength: Right: 12lbs; Left: 35 lbs, Lateral pinch: Right: 8 lbs, Left: 12 lbs, and 3 point pinch: Right: 5 lbs, Left: 12 lbs 07/24/23 Grip strength: Right: 16lbs; Left: 35 lbs, Lateral pinch: Right: 8 lbs, Left: 12 lbs, and 3 point pinch: Right: 5 lbs, Left: 12 lbs 08/12/2023 Grip right 18#, lateral pinch 11#, 3 point pinch 7# 08/15/23 Grip strength: Right: 20lbs; Left: 35 lbs, Lateral pinch: Right: 12 lbs, Left: 12 lbs, and 3 point pinch: Right: 7 lbs, Left: 12 lbs 08/18/23 Grip strength: Right: 22lbs; Left: 35 lbs, Lateral pinch: Right: 12 lbs, Left: 12 lbs, and 3 point pinch: Right: 9 lbs, Left: 12 lbs  COORDINATION: Impaired because of increased swelling and pain in stiffness  SENSATION: Numbness in 2nd, 3rd and 4th digits with third throwers  EDEMA: edema on the right hand  COGNITION: Overall cognitive status: Within functional limits for tasks assessed  TREATMENT DATE: 08/21/23    Patient reports stiffness and pain at 3rd PIP in the morning and when making a tighter composite grip.   But after washing some dishes in warm water and doing her range of motion exercises improving.   Patient able to push and pull heavy door.  Had initially pain with pushing.  But after some active assisted range of motion wrist flexion extension 10 reps was able to do pain-free. Patient was able to pick up and hold and carry 4 pounds in simulating pouring a drink but reports chest heaviness.   tenderness over Gyons canal decreased today to less than 2/10   She continues to report numbness in middle phalanges as well as DIPs of 3rd and 4th digits great improvement in sensation over distal palm as well as proximal phalanges.  On [employer] patient tested normal sensation.  In all  digits.  To the DIPs. Continues to show great progress in active range of motion in digits.  See flowsheet Patient also increased in grip and prehension strength since last week.       Therapeutic Exercises:   Patient to continue with contrast at home and tendon glides Thumb palmar radial abduction, 12 reps Opposition to all digits Upgrade patient to do 2 pound weight for wrist flexion extension, ulnar radial deviation as well as supination pronation 12 reps 2 times a day pain-free.  lateral pinch and 3-point pinch with teal putty 15 reps 2 times a day Patient can increase to a second set 2 times a day over the weekend if pain-free. Continue to hold off  on any gripping Reviewed with patient and patient to continue with home program medial nerve glide and ulnar nerve glide 5 reps 2 times a day at the end of home exercises    Patient needed some mod verbal cueing and min assist to perform correctly.  Add for patient scapular squeezes as well as horizontal shoulder abduction against wall 10 reps each 2 times a day  Cica -Care scar pad at nighttime Isotoner glove at nighttime and as needed during the day Silicone sleeve for compression on the third digit during the day.  PATIENT EDUCATION: Education details: findings of eval and HEP  Person educated: Patient Education method: Explanation, Demonstration, Tactile cues, Verbal cues, and Handouts Education comprehension: verbalized understanding, returned demonstration, verbal cues required, and needs  further education    GOALS: Goals reviewed with patient? Yes   LONG TERM GOALS: Target date: 8 wks  Patient be independent in home program to decrease edema and pain for patient to increase digit flexion to touching palm. Baseline: Incision not closed.  Increased edema and pain increased to 8-10/10 in the third digit; [facility] flexion 45 to 60 degrees and PIP 70-90 Goal status: Met  2.  Wrist flexion extension on the right improved to within  normal limits for patient to push and pull heavy door as well as turn doorknob without increased symptoms Baseline: Wrist flexion is 40 degrees and extension 70 with loose fist patient not using hand because of incision not closed Goal status: Progressing  3.  Incisional right hand improved for patient to initiate scar massage as well as being able to tolerate different textures including tapping and clapping hands with no increase symptoms Baseline: Patient report incision not closed since last time.  Steri-Strips came off and incision opening with digit extension.  Steri-Strips applied if not staying in place patient to contact orthopedics in the next 24 to 48 hours.  Keep home exercises without opening or putting stress on incision Goal status: Met  4.  Right grip and prehension strength improve to more than 75% compared to the left for patient to return to using hand normally at home and cooking and laundry and cleaning activities without increase symptoms Baseline: Patient is about 3 weeks postop.  Patient with increased edema and pain and stiffness.  Incision not closed-not tested Goal status: Progressing   ASSESSMENT:  CLINICAL IMPRESSION: Patient seen for occupational therapy for right arthroscopic carpal tunnel release and third digit trigger finger release by Dr. Edie -patient had surgery 05/28/2023.  Patient making great progress in active range of motion of all digits.  As well as grip and prehension strength.  Sensation improving.  Patient tested normal on [employer] although reports diminished sensation in middle and distal phalanges of 3rd and 4th.   Patient do report tenderness 7/10 over the pisiform and the Guyons canal last week -improved to 1-2/10 today after holding off on gripping activities -report some sensory changes in the DIP of the fifth last visit.  Continue with contrast and some ice massage.  Hold off on any gripping with the putty -but can continue with lateral  pinch and 3-point pinch with teal medium putty.  As well as added 2 pound weight for wrist and forearm in all planes 12 reps 2 times a day.  Patient can increase to a second set over the weekend if pain-free. Pt did undergo bariatric surgery  on the 10th July has 4 small incisions in abdominal region, she was not able to perform her full scope of HEP during that time and could not take her knucklebender with her since she was traveling and did not want to take anything metal.  She is doing well and still able to progress with exercises.  Patient report increased functional use dropping objects less.  Patient limited in functional use of right dominant hand in ADLs and IADLs.  Patient can benefit from skilled OT services to decrease edema, pain, scar tissue and increased motion and strength to return to prior level of function  PERFORMANCE DEFICITS: in functional skills including ADLs, IADLs, ROM, strength, pain, flexibility, decreased knowledge of use of DME, and UE functional use,   and psychosocial skills including environmental adaptation and routines and behaviors.   IMPAIRMENTS: are limiting patient from ADLs,  IADLs, rest and sleep, play, leisure, and social participation.   COMORBIDITIES: has no other co-morbidities that affects occupational performance. Patient will benefit from skilled OT to address above impairments and improve overall function.  MODIFICATION OR ASSISTANCE TO COMPLETE EVALUATION: No modification of tasks or assist necessary to complete an evaluation.  OT OCCUPATIONAL PROFILE AND HISTORY: Problem focused assessment: Including review of records relating to presenting problem.  CLINICAL DECISION MAKING: LOW - limited treatment options, no task modification necessary  REHAB POTENTIAL: Good for goals  EVALUATION COMPLEXITY: Low    PLAN:  OT FREQUENCY: 1-2x/week  OT DURATION: 8 weeks  PLANNED INTERVENTIONS: 97168 OT Re-evaluation, 97535 self care/ADL training, 02889  therapeutic exercise, 97530 therapeutic activity, 97140 manual therapy, 97018 paraffin, 02960 fluidotherapy, 97034 contrast bath, scar mobilization, passive range of motion, patient/family education, and DME and/or AE instructions  CONSULTED AND AGREED WITH PLAN OF CARE: Patient   Ancel Peters, OTR/L,CLT 08/21/2023, 12:53 PM

## 2023-08-25 ENCOUNTER — Ambulatory Visit: Admitting: Occupational Therapy

## 2023-08-25 DIAGNOSIS — M25631 Stiffness of right wrist, not elsewhere classified: Secondary | ICD-10-CM

## 2023-08-25 DIAGNOSIS — M25641 Stiffness of right hand, not elsewhere classified: Secondary | ICD-10-CM

## 2023-08-25 DIAGNOSIS — M79641 Pain in right hand: Secondary | ICD-10-CM

## 2023-08-25 DIAGNOSIS — M6281 Muscle weakness (generalized): Secondary | ICD-10-CM

## 2023-08-25 DIAGNOSIS — R600 Localized edema: Secondary | ICD-10-CM

## 2023-08-25 DIAGNOSIS — L905 Scar conditions and fibrosis of skin: Secondary | ICD-10-CM

## 2023-08-25 NOTE — Addendum Note (Signed)
 Addended by: ANCEL PETERS on: 08/25/2023 05:52 PM   Modules accepted: Orders

## 2023-08-25 NOTE — Therapy (Addendum)
 OUTPATIENT OCCUPATIONAL THERAPY ORTHO TREATMENT/RECERT   Patient Name: Taylor Valencia MRN: [id number] DOB:Dec 08, 1970, 53 y.o., female Today's Date: 08/25/2023  PCP: Dr Sadie MART PROVIDER: Kip PA  END OF SESSION:  OT End of Session - 08/25/23 1352     Visit Number 18    Number of Visits 26    Date for OT Re-Evaluation 09/22/23    OT Start Time 1352    OT Stop Time 1438    OT Time Calculation (min) 46 min    Activity Tolerance Patient tolerated treatment well    Behavior During Therapy WFL for tasks assessed/performed           Past Medical History:  Diagnosis Date   Arthritis    Chronic back pain    History of bronchitis    Hypertension    Pre-diabetes    Past Surgical History:  Procedure Laterality Date   BOWEL RESECTION     patient had some muscle removed    CARPAL TUNNEL RELEASE Right 05/28/2023   Procedure: RELEASE, CARPAL TUNNEL, ENDOSCOPIC;  Surgeon: Edie Norleen PARAS, MD;  Location: [facility];  Service: Orthopedics;  Laterality: Right;   CHOLECYSTECTOMY     COLONOSCOPY WITH PROPOFOL  N/A 03/11/2022   Procedure: COLONOSCOPY WITH PROPOFOL ;  Surgeon: Maryruth Ole DASEN, MD;  Location: [facility];  Service: Endoscopy;  Laterality: N/A;   EYE SURGERY     lasix surgery   LUMBAR LAMINECTOMY/DECOMPRESSION MICRODISCECTOMY N/A 08/24/2015   Procedure: Thoracic nine- ten, Thoracic ten- eleven Laminectomy;  Surgeon: Reyes Budge, MD;  Location: [facility];  Service: Neurosurgery;  Laterality: N/A;  T9-10, T10-11 Laminectomy   TONSILLECTOMY     TRIGGER FINGER RELEASE Right 05/28/2023   Procedure: RELEASE, A1 PULLEY, FOR TRIGGER FINGER;  Surgeon: Edie Norleen PARAS, MD;  Location: [facility];  Service: Orthopedics;  Laterality: Right;   Patient Active Problem List   Diagnosis Date Noted   Essential hypertension 12/22/2018   Morbid obesity (HCC) 12/22/2018   Depression 12/22/2018   Thoracic myelopathy 08/24/2015    ONSET DATE: 05/28/23  REFERRING DIAG:  R CTS and 3rd trigger finger release  THERAPY DIAG:  Stiffness of right hand, not elsewhere classified  Pain in right hand  Muscle weakness (generalized)  Stiffness of right wrist, not elsewhere classified  Localized edema  Scar condition and fibrosis of skin  Rationale for Evaluation and Treatment: Rehabilitation  SUBJECTIVE:   SUBJECTIVE STATEMENT: I still have pain in that middle joint and also at the tip joint of my middle fingers.  It gets tender.  Pain with making a fist with gripping and squeezing.   Pt accompanied by: self  PERTINENT HISTORY: Impression: Carpal tunnel syndrome, right [G56.01] Carpal tunnel syndrome, right (primary encounter diagnosis) Trigger middle finger of right hand  Plan:  1. Treatment options were discussed today with the patient. 2. Sutures removed at today's visit, benzoin and Steri-Strips were applied. The patient was instructed that she may begin the shower at this time. 3. She was given hand exercises to perform at home at this time. She was instructed to begin massaging the incision sites later this week to decrease chances of scar tissue development. 4. The patient was instructed to continue to wear the Velcro wrist splint for 1 more week at night but she does not need to wear it during today. Gradually increase activities as tolerated to the right hand. 5. Given her limitations with flexion at today's visit, a referral for formal occupational therapy was placed to the  patient. A work note was provided keeping the patient out until her next follow-up appointment. 6. The patient will follow-up in 1 month with Dr. Edie for repeat evaluation of the right hand. They can call the clinic they have any questions, new symptoms develop or symptoms worsen.   PRECAUTIONS: None  WEIGHT BEARING RESTRICTIONS: No  PAIN:  Are you having pain?  3rd PIP and DIP at times 4-5/10 pain - tendernenss and with composite tight grip  FALLS: Has patient fallen  in last 6 months? 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  In her free time doing things around the house  PATIENT GOALS: Get the scar better as well as her motion and strength to be able to do her job and things around the house  NEXT MD VISIT: ?  OBJECTIVE:  Note: Objective measures were completed at Evaluation unless otherwise noted.  HAND DOMINANCE: Right  ADLs: Patient limited in use of right dominant hand because of incision not closed and guarding as well as increased edema and decreased motion and increased pain   FUNCTIONAL OUTCOME MEASURES: Next session   UPPER EXTREMITY ROM:     Active ROM Right eval Left eval R 06/30/23 R 07/24/23 R 08/15/23  Shoulder flexion       Shoulder abduction       Shoulder adduction       Shoulder extension       Shoulder internal rotation       Shoulder external rotation       Elbow flexion       Elbow extension       Wrist flexion 40  42 65 70 80  Wrist extension 70 digits bend 66, digits slightly flexed 65 70 70  Wrist ulnar deviation       Wrist radial deviation       Wrist pronation       Wrist supination       (Blank rows = not tested)  Active ROM Right eval Left eval R 06/25/23 R  06/30/23 R 07/08/23 R 07/10/23 R 07/24/23 R 08/14/23  Thumb MCP (0-60)          Thumb IP (0-80)          Thumb Radial abd/add (0-55) 42    55      Thumb Palmar abd/add (0-45) 50    50      Thumb Opposition to Small Finger           Index MCP (0-90) 50   60 65 60 65 60 70  Index PIP (0-100) 70   85 95 95  90 90  Index DIP (0-70)            Long MCP (0-90) 45    55 60 60 70 60 75  Long PIP (0-100)  90   80 80 95  95 95  Long DIP (0-70)            Ring MCP (0-90) 60    60 60 60 70 65 80  Ring PIP (0-100) 80    80 95 100  100 100  Ring DIP (0-70)            Little MCP (0-90) 60    55 65 60 70 70 80  Little PIP (0-100) 80    80 85 95  100 95  Little  DIP (0-70)            (  Blank rows = not tested)   HAND FUNCTION:  Grip strength: Right: 10 lbs; Left: 35 lbs, Lateral pinch: Right: 8 lbs, Left: 12 lbs, and 3 point pinch: Right: 4 lbs, Left: 12 lbs 07/14/23 Grip strength: Right: 12lbs; Left: 35 lbs, Lateral pinch: Right: 8 lbs, Left: 12 lbs, and 3 point pinch: Right: 5 lbs, Left: 12 lbs 07/24/23 Grip strength: Right: 16lbs; Left: 35 lbs, Lateral pinch: Right: 8 lbs, Left: 12 lbs, and 3 point pinch: Right: 5 lbs, Left: 12 lbs 08/12/2023 Grip right 18#, lateral pinch 11#, 3 point pinch 7# 08/15/23 Grip strength: Right: 20lbs; Left: 35 lbs, Lateral pinch: Right: 12 lbs, Left: 12 lbs, and 3 point pinch: Right: 7 lbs, Left: 12 lbs 08/18/23 Grip strength: Right: 22lbs; Left: 35 lbs, Lateral pinch: Right: 12 lbs, Left: 12 lbs, and 3 point pinch: Right: 9 lbs, Left: 12 lbs 08/25/23 Grip strength: Right: 26 lbs; Left: 35 lbs, Lateral pinch: Right: 14 lbs, Left: 12 lbs, and 3 point pinch: Right: 10 lbs, Left: 12 lbs  COORDINATION: Impaired because of increased swelling and pain in stiffness  SENSATION: Numbness in 2nd, 3rd and 4th digits with third throwers  EDEMA: edema on the right hand  COGNITION: Overall cognitive status: Within functional limits for tasks assessed  TREATMENT DATE: 08/25/23    Patient reports stiffness and pain at 3rd PIP in the morning and when making a tighter composite grip.  DIP at times to with tenderness at both joints  But after washing some dishes in warm water and doing her range of motion exercises improving.   Patient able to push and pull heavy door.   active assisted range of motion wrist flexion extension 10 reps    She continues to report numbness in middle phalanges as well as DIPs of 3rd and 4th digits great improvement in sensation over distal palm as well as proximal phalanges.  On [employer] patient tested normal sensation.  In all digits.  To the DIPs. active range of motion in  R digits.   Same as L  See flowsheet Recommend for patient not to grip tight or squeeze to decrease pain at PIP and DIP of third.  grip and prehension strength continues to improve.  See flowsheet Patient less pain with a light Coban wrap around PIP with grip and 3-point pinch     Therapeutic Exercises:   Patient to continue with contrast at home and tendon glides-recommend not to do composite flexion tight or forceful Thumb palmar radial abduction, 12 reps Opposition to all digits Patient to continue with 2 pound weight for wrist flexion extension,supination pronation 12 reps 2 times a day pain-free. Hold off on radial ulnar deviation  lateral pinch improved to 14 pounds.  Patient to hold off  Focus on 3-point pinch with teal putty 15 reps 2 times a day but would like Coban wrap around the PIP. And add gripping also with a light Coban wrap around the PIP pain-free 12 reps Putty to be done 2 sets of 1215 pain-free 2 times a day.  This date add Red therapy and for gentle scapular retraction as well as shoulder extension 12 reps pain-free As well as elbow extension red Thera-Band left and right 12 reps 1 time a day  Continue with medial nerve glide and ulnar nerve glide 5 reps 2 times a day at the end of home exercises    Patient needed some mod verbal cueing and min assist to perform correctly.    [place] -  Care scar pad at nighttime Isotoner glove at nighttime and as needed during the day Silicone sleeve for compression on the third digit during the day.  PATIENT EDUCATION: Education details: findings of eval and HEP  Person educated: Patient Education method: Explanation, Demonstration, Tactile cues, Verbal cues, and Handouts Education comprehension: verbalized understanding, returned demonstration, verbal cues required, and needs further education    GOALS: Goals reviewed with patient? Yes   LONG TERM GOALS: Target date: 8 wks  Patient be independent in home program to decrease edema and pain for  patient to increase digit flexion to touching palm. Baseline: Incision not closed.  Increased edema and pain increased to 8-10/10 in the third digit; MC flexion 45 to 60 degrees and PIP 70-90 Goal status: Met  2.  Wrist flexion extension on the right improved to within normal limits for patient to push and pull heavy door as well as turn doorknob without increased symptoms Baseline: Wrist flexion is 40 degrees and extension 70 with loose fist patient not using hand because of incision not closed Goal status: Progressing  3.  Incisional right hand improved for patient to initiate scar massage as well as being able to tolerate different textures including tapping and clapping hands with no increase symptoms Baseline: Patient report incision not closed since last time.  Steri-Strips came off and incision opening with digit extension.  Steri-Strips applied if not staying in place patient to contact orthopedics in the next 24 to 48 hours.  Keep home exercises without opening or putting stress on incision Goal status: Met  4.  Right grip and prehension strength improve to more than 75% compared to the left for patient to return to using hand normally at home and cooking and laundry and cleaning activities without increase symptoms Baseline: Patient is about 3 weeks postop.  Patient with increased edema and pain and stiffness.  Incision not closed-not tested Goal status: Progressing   ASSESSMENT:  CLINICAL IMPRESSION: Patient seen for occupational therapy for right arthroscopic carpal tunnel release and third digit trigger finger release by Dr. Edie -patient had surgery 05/28/2023.  Patient making great progress in active range of motion of all digits.  As well as grip and prehension strength.  Sensation improving.  Patient tested normal on [employer] although reports diminished sensation in middle and distal phalanges of 3rd and 4th.   Patient do report tenderness 7/10 over the pisiform and the  Guyons canal 2 weeks ago-improved to 1-2/10 last week after holding off on gripping activities -report some sensory changes in the DIP of the fifth last visit.  Continue with contrast and some ice massage.  Patient grip) strength continue to improve.  Flexion of digits same as left hand.  At medium teal putty for gripping and 3-point pinch but with a light Coban wrap around PIP of third digit to decrease pain.  Can do 2 sets of 12 pain-free 2 times a day.  Continue with 2 pound weight for wrist and forearm but not ulnar radial deviation - 12 reps 2 times a day.  Add red Thera-Band for scapular retraction as well as shoulder extension as well as elbow extension pain-free 12 reps once a day.  With a modified grip.  Pt did undergo bariatric surgery  on the 10th July has 4 small incisions in abdominal region, she was not able to perform her full scope of HEP during that time -patient can do light strengthening that does not involve abdominals until 6 weeks.  She is  doing well and still able to progress with exercises.  Patient report increased functional use dropping objects less.  Patient limited in functional use of right dominant hand in ADLs and IADLs.  Patient can benefit from skilled OT services to decrease edema, pain, scar tissue and increased motion and strength to return to prior level of function  PERFORMANCE DEFICITS: in functional skills including ADLs, IADLs, ROM, strength, pain, flexibility, decreased knowledge of use of DME, and UE functional use,   and psychosocial skills including environmental adaptation and routines and behaviors.   IMPAIRMENTS: are limiting patient from ADLs, IADLs, rest and sleep, play, leisure, and social participation.   COMORBIDITIES: has no other co-morbidities that affects occupational performance. Patient will benefit from skilled OT to address above impairments and improve overall function.  MODIFICATION OR ASSISTANCE TO COMPLETE EVALUATION: No modification of  tasks or assist necessary to complete an evaluation.  OT OCCUPATIONAL PROFILE AND HISTORY: Problem focused assessment: Including review of records relating to presenting problem.  CLINICAL DECISION MAKING: LOW - limited treatment options, no task modification necessary  REHAB POTENTIAL: Good for goals  EVALUATION COMPLEXITY: Low    PLAN:  OT FREQUENCY: 1-2x/week  OT DURATION: 4 wks   PLANNED INTERVENTIONS: 97168 OT Re-evaluation, 97535 self care/ADL training, 02889 therapeutic exercise, 97530 therapeutic activity, 97140 manual therapy, 97018 paraffin, 02960 fluidotherapy, 97034 contrast bath, scar mobilization, passive range of motion, patient/family education, and DME and/or AE instructions  CONSULTED AND AGREED WITH PLAN OF CARE: Patient   Ancel Peters, OTR/L,CLT 08/25/2023, 2:47 PM

## 2023-08-28 ENCOUNTER — Ambulatory Visit: Admitting: Occupational Therapy

## 2023-08-28 DIAGNOSIS — M79641 Pain in right hand: Secondary | ICD-10-CM

## 2023-08-28 DIAGNOSIS — L905 Scar conditions and fibrosis of skin: Secondary | ICD-10-CM

## 2023-08-28 DIAGNOSIS — M25631 Stiffness of right wrist, not elsewhere classified: Secondary | ICD-10-CM

## 2023-08-28 DIAGNOSIS — M6281 Muscle weakness (generalized): Secondary | ICD-10-CM

## 2023-08-28 DIAGNOSIS — M25641 Stiffness of right hand, not elsewhere classified: Secondary | ICD-10-CM | POA: Diagnosis not present

## 2023-08-28 DIAGNOSIS — R600 Localized edema: Secondary | ICD-10-CM

## 2023-08-28 NOTE — Therapy (Signed)
 OUTPATIENT OCCUPATIONAL THERAPY ORTHO TREATMENT   Patient Name: Taylor Valencia MRN: [id number] DOB:1970-05-15, 53 y.o., female Today's Date: 08/28/2023  PCP: Dr Sadie MART PROVIDER: Kip PA  END OF SESSION:  OT End of Session - 08/28/23 1039     Visit Number 19    Number of Visits 26    Date for OT Re-Evaluation 09/22/23    OT Start Time 1039    OT Stop Time 1119    OT Time Calculation (min) 40 min    Activity Tolerance Patient tolerated treatment well    Behavior During Therapy WFL for tasks assessed/performed            Past Medical History:  Diagnosis Date   Arthritis    Chronic back pain    History of bronchitis    Hypertension    Pre-diabetes    Past Surgical History:  Procedure Laterality Date   BOWEL RESECTION     patient had some muscle removed    CARPAL TUNNEL RELEASE Right 05/28/2023   Procedure: RELEASE, CARPAL TUNNEL, ENDOSCOPIC;  Surgeon: Edie Norleen PARAS, MD;  Location: [facility];  Service: Orthopedics;  Laterality: Right;   CHOLECYSTECTOMY     COLONOSCOPY WITH PROPOFOL  N/A 03/11/2022   Procedure: COLONOSCOPY WITH PROPOFOL ;  Surgeon: Maryruth Ole DASEN, MD;  Location: [facility];  Service: Endoscopy;  Laterality: N/A;   EYE SURGERY     lasix surgery   LUMBAR LAMINECTOMY/DECOMPRESSION MICRODISCECTOMY N/A 08/24/2015   Procedure: Thoracic nine- ten, Thoracic ten- eleven Laminectomy;  Surgeon: Reyes Budge, MD;  Location: [facility];  Service: Neurosurgery;  Laterality: N/A;  T9-10, T10-11 Laminectomy   TONSILLECTOMY     TRIGGER FINGER RELEASE Right 05/28/2023   Procedure: RELEASE, A1 PULLEY, FOR TRIGGER FINGER;  Surgeon: Edie Norleen PARAS, MD;  Location: [facility];  Service: Orthopedics;  Laterality: Right;   Patient Active Problem List   Diagnosis Date Noted   Essential hypertension 12/22/2018   Morbid obesity (HCC) 12/22/2018   Depression 12/22/2018   Thoracic myelopathy 08/24/2015    ONSET DATE: 05/28/23  REFERRING DIAG: R CTS  and 3rd trigger finger release  THERAPY DIAG:  Stiffness of right hand, not elsewhere classified  Pain in right hand  Muscle weakness (generalized)  Stiffness of right wrist, not elsewhere classified  Localized edema  Scar condition and fibrosis of skin  Rationale for Evaluation and Treatment: Rehabilitation  SUBJECTIVE:   SUBJECTIVE STATEMENT: I am using my hand more normally.  But some nights that middle joint of my middle finger hurts on the top.  But the Coban wrap and the Digi sleeve helps during the day.  Not doing the band to add it last time.  Still weak like picking up pitcher with my left hand support with the right Pt accompanied by: self  PERTINENT HISTORY: Impression: Carpal tunnel syndrome, right [G56.01] Carpal tunnel syndrome, right (primary encounter diagnosis) Trigger middle finger of right hand  Plan:  1. Treatment options were discussed today with the patient. 2. Sutures removed at today's visit, benzoin and Steri-Strips were applied. The patient was instructed that she may begin the shower at this time. 3. She was given hand exercises to perform at home at this time. She was instructed to begin massaging the incision sites later this week to decrease chances of scar tissue development. 4. The patient was instructed to continue to wear the Velcro wrist splint for 1 more week at night but she does not need to wear it during today. Gradually  increase activities as tolerated to the right hand. 5. Given her limitations with flexion at today's visit, a referral for formal occupational therapy was placed to the patient. A work note was provided keeping the patient out until her next follow-up appointment. 6. The patient will follow-up in 1 month with Dr. Edie for repeat evaluation of the right hand. They can call the clinic they have any questions, new symptoms develop or symptoms worsen.   PRECAUTIONS: None  WEIGHT BEARING RESTRICTIONS: No  PAIN:  Are you  having pain?  3rd PIP and DIP ender at times and pain with strength  FALLS: Has patient fallen in last 6 months? 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  In her free time doing things around the house  PATIENT GOALS: Get the scar better as well as her motion and strength to be able to do her job and things around the house  NEXT MD VISIT: ?  OBJECTIVE:  Note: Objective measures were completed at Evaluation unless otherwise noted.  HAND DOMINANCE: Right  ADLs: Patient limited in use of right dominant hand because of incision not closed and guarding as well as increased edema and decreased motion and increased pain   FUNCTIONAL OUTCOME MEASURES: Next session   UPPER EXTREMITY ROM:     Active ROM Right eval Left eval R 06/30/23 R 07/24/23 R 08/15/23  Shoulder flexion       Shoulder abduction       Shoulder adduction       Shoulder extension       Shoulder internal rotation       Shoulder external rotation       Elbow flexion       Elbow extension       Wrist flexion 40  42 65 70 80  Wrist extension 70 digits bend 66, digits slightly flexed 65 70 70  Wrist ulnar deviation       Wrist radial deviation       Wrist pronation       Wrist supination       (Blank rows = not tested)  Active ROM Right eval Left eval R 06/25/23 R  06/30/23 R 07/08/23 R 07/10/23 R 07/24/23 R 08/14/23  Thumb MCP (0-60)          Thumb IP (0-80)          Thumb Radial abd/add (0-55) 42    55      Thumb Palmar abd/add (0-45) 50    50      Thumb Opposition to Small Finger           Index MCP (0-90) 50   60 65 60 65 60 70  Index PIP (0-100) 70   85 95 95  90 90  Index DIP (0-70)            Long MCP (0-90) 45    55 60 60 70 60 75  Long PIP (0-100)  90   80 80 95  95 95  Long DIP (0-70)            Ring MCP (0-90) 60    60 60 60 70 65 80  Ring PIP (0-100) 80    80 95 100  100 100  Ring DIP (0-70)             Little MCP (0-90) 60    55 65  60 70 70 80  Little PIP (0-100) 80    80 85 95  100 95  Little DIP (0-70)            (Blank rows = not tested)   HAND FUNCTION:  Grip strength: Right: 10 lbs; Left: 35 lbs, Lateral pinch: Right: 8 lbs, Left: 12 lbs, and 3 point pinch: Right: 4 lbs, Left: 12 lbs 07/14/23 Grip strength: Right: 12lbs; Left: 35 lbs, Lateral pinch: Right: 8 lbs, Left: 12 lbs, and 3 point pinch: Right: 5 lbs, Left: 12 lbs 07/24/23 Grip strength: Right: 16lbs; Left: 35 lbs, Lateral pinch: Right: 8 lbs, Left: 12 lbs, and 3 point pinch: Right: 5 lbs, Left: 12 lbs 08/12/2023 Grip right 18#, lateral pinch 11#, 3 point pinch 7# 08/15/23 Grip strength: Right: 20lbs; Left: 35 lbs, Lateral pinch: Right: 12 lbs, Left: 12 lbs, and 3 point pinch: Right: 7 lbs, Left: 12 lbs 08/18/23 Grip strength: Right: 22lbs; Left: 35 lbs, Lateral pinch: Right: 12 lbs, Left: 12 lbs, and 3 point pinch: Right: 9 lbs, Left: 12 lbs 08/25/23 Grip strength: Right: 26 lbs; Left: 35 lbs, Lateral pinch: Right: 14 lbs, Left: 12 lbs, and 3 point pinch: Right: 10 lbs, Left: 12 lbs 08/28/23 Grip strength: Right: 27 lbs; Left: 35 lbs, Lateral pinch: Right: 14 lbs, Left: 12 lbs, and 3 point pinch: Right: 10 lbs, Left: 12 lbs COORDINATION: Impaired because of increased swelling and pain in stiffness  SENSATION: Numbness in 2nd, 3rd and 4th digits with third throwers  EDEMA: edema on the right hand  COGNITION: Overall cognitive status: Within functional limits for tasks assessed  TREATMENT DATE: 08/28/23    Patient reports stiffness and pain at 3rd PIP at night and some mornings -   DIP at times also tender  But after washing some dishes in warm water and doing her range of motion exercises improving.    Patient is symptom-free pushing and pulling heavy door.  Turning a doorknob.  Carry 4 to 8 pounds symptom-free.   But simulate lifting and pouring 6 to 8 pounds causing discomfort and pain.  At the middle finger.   Pushing up  from a chair discomfort over the palm.   Simulating and picking up objects more than 6 pounds patient compensate with shoulder elevation as well as shoulder abduction.   Numbness in 3rd and 4th digit continues to improve. Numbness now mostly at [facility] of 3rd and 4th.  3 weeks ago was also in the middle phalanges.  On [employer] patient tested normal sensation.  In all digits.   Active range of motion in digits within normal limits compared to the left hand.   Was able to upgrade patient to medium firm green putty for gripping and 3 point and lateral pinch. 12 reps.  2 times a day. Patient can do's 2 sets pain-free But need compression around the PIP with Coban to be pain-free.   Therapeutic Exercises:   Patient to continue with contrast at home and tendon glides  Patient to continue with 2 pound weight for wrist flexion extension,supination pronation and radial ulnar deviation 3 sets of 12 reps  2 times a day pain-free.  Continue with the putty see above The state done table slides 20 reps followed by weighted ball putting pressure through palm rolling in all directions on putty for x 10 reps repeated 2 times Weighted 1 kg ball on flex desk-rolling supination pronation as well as circles as well as balancing pain-free all 20 reps  1 kg ball throwing and catching on rebounder 1 minute symptom-free  3 pounds elbow flexion 10 reps  Reviewed with patient Red Thera-Band for scapular retraction as well as shoulder extension 12 reps pain-free As well as elbow extension red Thera-Band left and right 12 reps Patient can increase to a second set over the weekend pain-free 1 time a day Patient needed mod verbal cueing for technique and not compensating with shoulder elevation.  Continue with medial nerve glide and ulnar nerve glide 5 reps 2 times a day at the end of home exercises    Patient needed some mod verbal cueing and min assist to perform correctly.    Cica -Care scar pad at  nighttime Isotoner glove at nighttime and as needed during the day Silicone sleeve for compression on the third digit during the day.  PATIENT EDUCATION: Education details: findings of eval and HEP  Person educated: Patient Education method: Explanation, Demonstration, Tactile cues, Verbal cues, and Handouts Education comprehension: verbalized understanding, returned demonstration, verbal cues required, and needs further education    GOALS: Goals reviewed with patient? Yes   LONG TERM GOALS: Target date: 8 wks  Patient be independent in home program to decrease edema and pain for patient to increase digit flexion to touching palm. Baseline: Incision not closed.  Increased edema and pain increased to 8-10/10 in the third digit; MC flexion 45 to 60 degrees and PIP 70-90 Goal status: Met  2.  Wrist flexion extension on the right improved to within normal limits for patient to push and pull heavy door as well as turn doorknob without increased symptoms Baseline: Wrist flexion is 40 degrees and extension 70 with loose fist patient not using hand because of incision not closed Goal status: Progressing  3.  Incisional right hand improved for patient to initiate scar massage as well as being able to tolerate different textures including tapping and clapping hands with no increase symptoms Baseline: Patient report incision not closed since last time.  Steri-Strips came off and incision opening with digit extension.  Steri-Strips applied if not staying in place patient to contact orthopedics in the next 24 to 48 hours.  Keep home exercises without opening or putting stress on incision Goal status: Met  4.  Right grip and prehension strength improve to more than 75% compared to the left for patient to return to using hand normally at home and cooking and laundry and cleaning activities without increase symptoms Baseline: Patient is about 3 weeks postop.  Patient with increased edema and pain and  stiffness.  Incision not closed-not tested Goal status: Progressing   ASSESSMENT:  CLINICAL IMPRESSION: Patient seen for occupational therapy for right arthroscopic carpal tunnel release and third digit trigger finger release by Dr. Edie -patient had surgery 05/28/2023.  Patient making great progress in active range of motion of all digits.  Within normal limits compared to the left hand.  Patient limited mostly in pain at PIP joint.  Mostly at nighttime or with composite grip against resistance.  Patient does better with light compression, and around PIP.  Making progress slow but steady in grip and prehension strength.  Sensation improving-numbness only on DIP of 3rd and 4th this date.  Patient tested normal on [employer] although reports diminished sensation in distal phalanges of 3rd and 4th.  Patient upgraded to medium firm green putty for gripping and 3-point pinch but with a light Coban wrap around PIP of third digit to decrease pain.  Can do 2 sets  of 12 pain-free 2 times a day.  Patient can continue 2 pound weight for wrist and forearm in all planes and added this week red Thera-Band for scapular retraction as well as shoulder extension as well as elbow extension pain-free -can increase to 2 sets of 12 reps once a day.  With a modified grip.  Patient compensates with shoulder elevation and abduction with strengthening.  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  Pt did undergo bariatric surgery  on the 10th July has 4 small incisions in abdominal region, she was not able to perform her full scope of HEP during that time -patient can do light strengthening that does not involve abdominals until 6 weeks.  She is doing well and still able to progress with exercises.  Patient report increased functional use dropping objects less.  Patient limited in functional use of right dominant hand in ADLs and IADLs.  Patient  can benefit from skilled OT services to decrease edema, pain, scar tissue and increased motion and strength to return to prior level of function  PERFORMANCE DEFICITS: in functional skills including ADLs, IADLs, ROM, strength, pain, flexibility, decreased knowledge of use of DME, and UE functional use,   and psychosocial skills including environmental adaptation and routines and behaviors.   IMPAIRMENTS: are limiting patient from ADLs, IADLs, rest and sleep, play, leisure, and social participation.   COMORBIDITIES: has no other co-morbidities that affects occupational performance. Patient will benefit from skilled OT to address above impairments and improve overall function.  MODIFICATION OR ASSISTANCE TO COMPLETE EVALUATION: No modification of tasks or assist necessary to complete an evaluation.  OT OCCUPATIONAL PROFILE AND HISTORY: Problem focused assessment: Including review of records relating to presenting problem.  CLINICAL DECISION MAKING: LOW - limited treatment options, no task modification necessary  REHAB POTENTIAL: Good for goals  EVALUATION COMPLEXITY: Low    PLAN:  OT FREQUENCY: 1-2x/week  OT DURATION: 4 wks   PLANNED INTERVENTIONS: 97168 OT Re-evaluation, 97535 self care/ADL training, 02889 therapeutic exercise, 97530 therapeutic activity, 97140 manual therapy, 97018 paraffin, 02960 fluidotherapy, 97034 contrast bath, scar mobilization, passive range of motion, patient/family education, and DME and/or AE instructions  CONSULTED AND AGREED WITH PLAN OF CARE: Patient   Ancel Peters, OTR/L,CLT 08/28/2023, 12:58 PM

## 2023-09-02 ENCOUNTER — Ambulatory Visit: Attending: Student | Admitting: Occupational Therapy

## 2023-09-02 DIAGNOSIS — R600 Localized edema: Secondary | ICD-10-CM | POA: Insufficient documentation

## 2023-09-02 DIAGNOSIS — M79641 Pain in right hand: Secondary | ICD-10-CM | POA: Insufficient documentation

## 2023-09-02 DIAGNOSIS — M25641 Stiffness of right hand, not elsewhere classified: Secondary | ICD-10-CM | POA: Insufficient documentation

## 2023-09-02 DIAGNOSIS — M6281 Muscle weakness (generalized): Secondary | ICD-10-CM | POA: Insufficient documentation

## 2023-09-02 DIAGNOSIS — L905 Scar conditions and fibrosis of skin: Secondary | ICD-10-CM | POA: Diagnosis present

## 2023-09-02 DIAGNOSIS — M25631 Stiffness of right wrist, not elsewhere classified: Secondary | ICD-10-CM | POA: Insufficient documentation

## 2023-09-02 NOTE — Therapy (Signed)
 OUTPATIENT OCCUPATIONAL THERAPY ORTHO TREATMENT/20th visit   Patient Name: Taylor Valencia MRN: [id number] DOB:[birth year], 53 y.o., female Today's Date: 09/02/2023  PCP: Dr Sadie MART PROVIDER: Kip PA  END OF SESSION:  OT End of Session - 09/02/23 1532     Visit Number 20    Number of Visits 26    Date for OT Re-Evaluation 09/22/23    OT Start Time 1532    OT Stop Time 1616    OT Time Calculation (min) 44 min    Activity Tolerance Patient tolerated treatment well    Behavior During Therapy WFL for tasks assessed/performed            Past Medical History:  Diagnosis Date   Arthritis    Chronic back pain    History of bronchitis    Hypertension    Pre-diabetes    Past Surgical History:  Procedure Laterality Date   BOWEL RESECTION     patient had some muscle removed    CARPAL TUNNEL RELEASE Right 05/28/2023   Procedure: RELEASE, CARPAL TUNNEL, ENDOSCOPIC;  Surgeon: Edie Norleen PARAS, MD;  Location: [facility];  Service: Orthopedics;  Laterality: Right;   CHOLECYSTECTOMY     COLONOSCOPY WITH PROPOFOL  N/A 03/11/2022   Procedure: COLONOSCOPY WITH PROPOFOL ;  Surgeon: Maryruth Ole DASEN, MD;  Location: [facility];  Service: Endoscopy;  Laterality: N/A;   EYE SURGERY     lasix surgery   LUMBAR LAMINECTOMY/DECOMPRESSION MICRODISCECTOMY N/A 08/24/2015   Procedure: Thoracic nine- ten, Thoracic ten- eleven Laminectomy;  Surgeon: Reyes Budge, MD;  Location: [facility];  Service: Neurosurgery;  Laterality: N/A;  T9-10, T10-11 Laminectomy   TONSILLECTOMY     TRIGGER FINGER RELEASE Right 05/28/2023   Procedure: RELEASE, A1 PULLEY, FOR TRIGGER FINGER;  Surgeon: Edie Norleen PARAS, MD;  Location: [facility];  Service: Orthopedics;  Laterality: Right;   Patient Active Problem List   Diagnosis Date Noted   Essential hypertension 12/22/2018   Morbid obesity (HCC) 12/22/2018   Depression 12/22/2018   Thoracic myelopathy 08/24/2015    ONSET DATE: 05/28/23  REFERRING  DIAG: R CTS and 3rd trigger finger release  THERAPY DIAG:  Stiffness of right hand, not elsewhere classified  Pain in right hand  Muscle weakness (generalized)  Stiffness of right wrist, not elsewhere classified  Localized edema  Rationale for Evaluation and Treatment: Rehabilitation  SUBJECTIVE:   SUBJECTIVE STATEMENT: I am using my hand more normally.  But some nights that middle joint of my middle finger hurts on the top.  But the Coban wrap and the Digi sleeve helps during the day.  Done the band exercises, weight and putty- some mornings my fingers more stiff in the middle finger hurting.  I am still good to be out for weeks because of my work that is physical and I can maybe go back to in 6 hours a day initially Pt accompanied by: self  PERTINENT HISTORY: Impression: Carpal tunnel syndrome, right [G56.01] Carpal tunnel syndrome, right (primary encounter diagnosis) Trigger middle finger of right hand  Plan:  1. Treatment options were discussed today with the patient. 2. Sutures removed at today's visit, benzoin and Steri-Strips were applied. The patient was instructed that she may begin the shower at this time. 3. She was given hand exercises to perform at home at this time. She was instructed to begin massaging the incision sites later this week to decrease chances of scar tissue development. 4. The patient was instructed to continue to wear the Velcro wrist  splint for 1 more week at night but she does not need to wear it during today. Gradually increase activities as tolerated to the right hand. 5. Given her limitations with flexion at today's visit, a referral for formal occupational therapy was placed to the patient. A work note was provided keeping the patient out until her next follow-up appointment. 6. The patient will follow-up in 1 month with Dr. Edie for repeat evaluation of the right hand. They can call the clinic they have any questions, new symptoms develop or  symptoms worsen.   PRECAUTIONS: None  WEIGHT BEARING RESTRICTIONS: No  PAIN:  Are you having pain?  3rd PIP and DIP ender at times and pain with strength  FALLS: Has patient fallen in last 6 months? 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  In her free time doing things around the house  PATIENT GOALS: Get the scar better as well as her motion and strength to be able to do her job and things around the house  NEXT MD VISIT: ?  OBJECTIVE:  Note: Objective measures were completed at Evaluation unless otherwise noted.  HAND DOMINANCE: Right  ADLs: Patient limited in use of right dominant hand because of incision not closed and guarding as well as increased edema and decreased motion and increased pain   FUNCTIONAL OUTCOME MEASURES: Next session   UPPER EXTREMITY ROM:     Active ROM Right eval Left eval R 06/30/23 R 07/24/23 R 08/15/23  Shoulder flexion       Shoulder abduction       Shoulder adduction       Shoulder extension       Shoulder internal rotation       Shoulder external rotation       Elbow flexion       Elbow extension       Wrist flexion 40  42 65 70 80  Wrist extension 70 digits bend 66, digits slightly flexed 65 70 70  Wrist ulnar deviation       Wrist radial deviation       Wrist pronation       Wrist supination       (Blank rows = not tested)  Active ROM Right eval Left eval R 06/25/23 R  06/30/23 R 07/08/23 R 07/10/23 R 07/24/23 R 08/14/23  Thumb MCP (0-60)          Thumb IP (0-80)          Thumb Radial abd/add (0-55) 42    55      Thumb Palmar abd/add (0-45) 50    50      Thumb Opposition to Small Finger           Index MCP (0-90) 50   60 65 60 65 60 70  Index PIP (0-100) 70   85 95 95  90 90  Index DIP (0-70)            Long MCP (0-90) 45    55 60 60 70 60 75  Long PIP (0-100)  90   80 80 95  95 95  Long DIP (0-70)            Ring MCP (0-90) 60     60 60 60 70 65 80  Ring PIP (0-100) 80    80 95 100  100 100  Ring DIP (0-70)  Little MCP (0-90) 60    55 65 60 70 70 80  Little PIP (0-100) 80    80 85 95  100 95  Little DIP (0-70)            (Blank rows = not tested)   HAND FUNCTION:  Grip strength: Right: 10 lbs; Left: 35 lbs, Lateral pinch: Right: 8 lbs, Left: 12 lbs, and 3 point pinch: Right: 4 lbs, Left: 12 lbs 07/14/23 Grip strength: Right: 12lbs; Left: 35 lbs, Lateral pinch: Right: 8 lbs, Left: 12 lbs, and 3 point pinch: Right: 5 lbs, Left: 12 lbs 07/24/23 Grip strength: Right: 16lbs; Left: 35 lbs, Lateral pinch: Right: 8 lbs, Left: 12 lbs, and 3 point pinch: Right: 5 lbs, Left: 12 lbs 08/12/2023 Grip right 18#, lateral pinch 11#, 3 point pinch 7# 08/15/23 Grip strength: Right: 20lbs; Left: 35 lbs, Lateral pinch: Right: 12 lbs, Left: 12 lbs, and 3 point pinch: Right: 7 lbs, Left: 12 lbs 08/18/23 Grip strength: Right: 22lbs; Left: 35 lbs, Lateral pinch: Right: 12 lbs, Left: 12 lbs, and 3 point pinch: Right: 9 lbs, Left: 12 lbs 08/25/23 Grip strength: Right: 26 lbs; Left: 35 lbs, Lateral pinch: Right: 14 lbs, Left: 12 lbs, and 3 point pinch: Right: 10 lbs, Left: 12 lbs 08/28/23 Grip strength: Right: 27 lbs; Left: 35 lbs, Lateral pinch: Right: 14 lbs, Left: 12 lbs, and 3 point pinch: Right: 10 lbs, Left: 12 lbs 09/02/23 Grip strength: Right: 27 lbs; Left: 45 lbs, Lateral pinch: Right: 14 lbs, Left: 12 lbs, and 3 point pinch: Right: 10 lbs, Left: 12 lbs COORDINATION: Impaired because of increased swelling and pain in stiffness  SENSATION: Numbness in 2nd, 3rd and 4th digits with third throwers  EDEMA: edema on the right hand  COGNITION: Overall cognitive status: Within functional limits for tasks assessed  TREATMENT DATE: 09/02/23    Patient reports stiffness and pain at 3rd PIP at night and some mornings -   DIP at times also tender  But after washing some dishes in warm water and doing her range of motion exercises  improving.    Numbness in 3rd and 4th digit continues to improve. Numbness now mostly at [facility] of 3rd and 4th.  3 weeks ago was also in the middle phalanges.  On [employer] patient tested normal sensation.  In all digits.   Active range of motion in digits within normal limits compared to the left hand.   Patient continue with medium firm green putty for gripping 2 sets of 12 pain-free   2 times a day. Patient can do compression around the PIP with Coban to be pain-free.  Fluidotherapy done to decrease pain and stiffness at endrange wrist flexion as well as third PIP 8 minutes prior to scar mobilization using mini massager and extractor on volar carpal scar at composite flexion In combination with digit flexion extension and wrist extension Patient continue with some scar mobilization with wrist in flexion  Therapeutic Exercises:   Patient to continue with contrast at home and tendon glides  Patient to continue with 2 pound weight supination pronation and radial ulnar deviation to sets of 12 reps 1 time a day pain-free 3 pounds for elbow flexion 2 sets 12 reps 1 time a day  Continue with table slides 20 reps    Red Thera-Band for scapular retraction as well as shoulder extension 12 reps pain-free As well as elbow extension red Thera-Band  right 12 reps 2 sets  1 time a day  Patient needed mod verbal cueing for technique and not compensating with shoulder elevation.  Patient had prior to surgeries some lateral epicondyle pain. Patient tender but negative for pain with wrist extension or third digit extension Patient tight over extensors with extended arm wrist flexion composite in neutral and pronation. At 5 reps 5 seconds stretch for both    Cica -Care scar pad at nighttime Isotoner glove at nighttime and as needed during the day Silicone sleeve for compression on the third digit during the day.  PATIENT EDUCATION: Education details: findings of eval and HEP  Person  educated: Patient Education method: Explanation, Demonstration, Tactile cues, Verbal cues, and Handouts Education comprehension: verbalized understanding, returned demonstration, verbal cues required, and needs further education    GOALS: Goals reviewed with patient? Yes   LONG TERM GOALS: Target date: 8 wks  Patient be independent in home program to decrease edema and pain for patient to increase digit flexion to touching palm. Baseline: Incision not closed.  Increased edema and pain increased to 8-10/10 in the third digit; MC flexion 45 to 60 degrees and PIP 70-90 Goal status: Met  2.  Wrist flexion extension on the right improved to within normal limits for patient to push and pull heavy door as well as turn doorknob without increased symptoms Baseline: Wrist flexion is 40 degrees and extension 70 with loose fist patient not using hand because of incision not closed Goal status: Progressing  3.  Incisional right hand improved for patient to initiate scar massage as well as being able to tolerate different textures including tapping and clapping hands with no increase symptoms Baseline: Patient report incision not closed since last time.  Steri-Strips came off and incision opening with digit extension.  Steri-Strips applied if not staying in place patient to contact orthopedics in the next 24 to 48 hours.  Keep home exercises without opening or putting stress on incision Goal status: Met  4.  Right grip and prehension strength improve to more than 75% compared to the left for patient to return to using hand normally at home and cooking and laundry and cleaning activities without increase symptoms Baseline: Patient is about 3 weeks postop.  Patient with increased edema and pain and stiffness.  Incision not closed-not tested Goal status: Progressing   ASSESSMENT:  CLINICAL IMPRESSION: Patient seen for occupational therapy for right arthroscopic carpal tunnel release and third digit  trigger finger release by Dr. Edie -patient had surgery 05/28/2023.  Patient making great progress in active range of motion of all digits.  Within normal limits compared to the left hand.  Patient limited mostly in pain at PIP joint.  Mostly at nighttime or with composite grip against resistance.  Patient does better with light compression, and around PIP.  Making progress slow but steady in grip and prehension strength.  Sensation improving-numbness only on DIP of 3rd and 4th this date.  Patient tested normal on [employer] although reports diminished sensation in distal phalanges of 3rd and 4th.  Patient upgraded to medium firm green putty for gripping and 3-point pinch but with a light Coban wrap around PIP of third digit to decrease pain.  Can do 2 sets of 12 pain-free 2 times a day.  Patient can continue 2 pound weight for wrist radial deviation and ulnar deviation and forearm in all planes and continue red Thera-Band for scapular retraction as well as shoulder extension as well as elbow extension pain-free -2 sets of 12 reps once a day.  With  a modified grip.  Patient compensates with shoulder elevation and abduction with strengthening.  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  Pt did undergo bariatric surgery  on the 10th July has 4 small incisions in abdominal region, she was not able to perform her full scope of HEP during that time -patient can do light strengthening that does not involve abdominals until 6 weeks.  She is doing well and still able to progress with exercises.  Patient report increased functional use dropping objects less.  Patient limited in functional use of right dominant hand in ADLs and IADLs.  Patient can benefit from skilled OT services to decrease edema, pain, scar tissue and increased motion and strength to return to prior level of function  PERFORMANCE DEFICITS: in functional skills  including ADLs, IADLs, ROM, strength, pain, flexibility, decreased knowledge of use of DME, and UE functional use,   and psychosocial skills including environmental adaptation and routines and behaviors.   IMPAIRMENTS: are limiting patient from ADLs, IADLs, rest and sleep, play, leisure, and social participation.   COMORBIDITIES: has no other co-morbidities that affects occupational performance. Patient will benefit from skilled OT to address above impairments and improve overall function.  MODIFICATION OR ASSISTANCE TO COMPLETE EVALUATION: No modification of tasks or assist necessary to complete an evaluation.  OT OCCUPATIONAL PROFILE AND HISTORY: Problem focused assessment: Including review of records relating to presenting problem.  CLINICAL DECISION MAKING: LOW - limited treatment options, no task modification necessary  REHAB POTENTIAL: Good for goals  EVALUATION COMPLEXITY: Low    PLAN:  OT FREQUENCY: 1-2x/week  OT DURATION: 4 wks   PLANNED INTERVENTIONS: 97168 OT Re-evaluation, 97535 self care/ADL training, 02889 therapeutic exercise, 97530 therapeutic activity, 97140 manual therapy, 97018 paraffin, 02960 fluidotherapy, 97034 contrast bath, scar mobilization, passive range of motion, patient/family education, and DME and/or AE instructions  CONSULTED AND AGREED WITH PLAN OF CARE: Patient   Ancel Peters, OTR/L,CLT 09/02/2023, 5:07 PM

## 2023-09-05 ENCOUNTER — Ambulatory Visit: Admitting: Occupational Therapy

## 2023-09-05 DIAGNOSIS — M25641 Stiffness of right hand, not elsewhere classified: Secondary | ICD-10-CM | POA: Diagnosis not present

## 2023-09-05 DIAGNOSIS — M79641 Pain in right hand: Secondary | ICD-10-CM

## 2023-09-05 DIAGNOSIS — R600 Localized edema: Secondary | ICD-10-CM

## 2023-09-05 DIAGNOSIS — L905 Scar conditions and fibrosis of skin: Secondary | ICD-10-CM

## 2023-09-05 DIAGNOSIS — M6281 Muscle weakness (generalized): Secondary | ICD-10-CM

## 2023-09-05 DIAGNOSIS — M25631 Stiffness of right wrist, not elsewhere classified: Secondary | ICD-10-CM

## 2023-09-05 NOTE — Therapy (Signed)
 OUTPATIENT OCCUPATIONAL THERAPY ORTHO TREATMENT   Patient Name: Taylor Valencia MRN: [id number] DOB:October 26, 1970, 53 y.o., female Today's Date: 09/05/2023  PCP: Dr Sadie MART PROVIDER: Kip PA  END OF SESSION:  OT End of Session - 09/05/23 1049     Visit Number 21    Number of Visits 26    Date for OT Re-Evaluation 09/22/23    OT Start Time 0906    OT Stop Time 0945    OT Time Calculation (min) 39 min    Activity Tolerance Patient tolerated treatment well    Behavior During Therapy [facility] for tasks assessed/performed            Past Medical History:  Diagnosis Date   Arthritis    Chronic back pain    History of bronchitis    Hypertension    Pre-diabetes    Past Surgical History:  Procedure Laterality Date   BOWEL RESECTION     patient had some muscle removed    CARPAL TUNNEL RELEASE Right 05/28/2023   Procedure: RELEASE, CARPAL TUNNEL, ENDOSCOPIC;  Surgeon: Edie Norleen PARAS, MD;  Location: [facility] ORS;  Service: Orthopedics;  Laterality: Right;   CHOLECYSTECTOMY     COLONOSCOPY WITH PROPOFOL  N/A 03/11/2022   Procedure: COLONOSCOPY WITH PROPOFOL ;  Surgeon: Maryruth Ole DASEN, MD;  Location: [facility] END[facility];  Service: Endoscopy;  Laterality: N/A;   EYE SURGERY     lasix surgery   LUMBAR LAMINECTOMY/DECOMPRESSION MICRODISCECTOMY N/A 08/24/2015   Procedure: Thoracic nine- ten, Thoracic ten- eleven Laminectomy;  Surgeon: Reyes Budge, MD;  Location: [facility];  Service: Neurosurgery;  Laterality: N/A;  T9-10, T10-11 Laminectomy   TONSILLECTOMY     TRIGGER FINGER RELEASE Right 05/28/2023   Procedure: RELEASE, A1 PULLEY, FOR TRIGGER FINGER;  Surgeon: Edie Norleen PARAS, MD;  Location: [facility] OR[facility];  Service: Orthopedics;  Laterality: Right;   Patient Active Problem List   Diagnosis Date Noted   Essential hypertension 12/22/2018   Morbid obesity (HCC) 12/22/2018   Depression 12/22/2018   Thoracic myelopathy 08/24/2015    ONSET DATE: 05/28/23  REFERRING DIAG: R CTS  and 3rd trigger finger release  THERAPY DIAG:  Stiffness of right hand, not elsewhere classified  Pain in right hand  Muscle weakness (generalized)  Stiffness of right wrist, not elsewhere classified  Localized edema  Scar condition and fibrosis of skin  Rationale for Evaluation and Treatment: Rehabilitation  SUBJECTIVE:   SUBJECTIVE STATEMENT: My middle finger joint still hurts in the morning.  My hands little stiff in the mornings.  But after I wash dishes he gets better.  It just depends what I do and that knuckle of my middle finger hurts.  Feels like there is a knot.  Otherwise him eating for 5 small meals.  And trying to do things around the house.  I did try and poor some milk and my wrist was hurting. Pt accompanied by: self  PERTINENT HISTORY: Impression: Carpal tunnel syndrome, right [G56.01] Carpal tunnel syndrome, right (primary encounter diagnosis) Trigger middle finger of right hand  Plan:  1. Treatment options were discussed today with the patient. 2. Sutures removed at today's visit, benzoin and Steri-Strips were applied. The patient was instructed that she may begin the shower at this time. 3. She was given hand exercises to perform at home at this time. She was instructed to begin massaging the incision sites later this week to decrease chances of scar tissue development. 4. The patient was instructed to continue to wear the Velcro wrist  splint for 1 more week at night but she does not need to wear it during today. Gradually increase activities as tolerated to the right hand. 5. Given her limitations with flexion at today's visit, a referral for formal occupational therapy was placed to the patient. A work note was provided keeping the patient out until her next follow-up appointment. 6. The patient will follow-up in 1 month with Dr. Edie for repeat evaluation of the right hand. They can call the clinic they have any questions, new symptoms develop or symptoms  worsen.   PRECAUTIONS: None  WEIGHT BEARING RESTRICTIONS: No  PAIN:  Are you having pain?  3rd PIP and DIP ender at times and pain with strength; and wrist with pouring a drink  FALLS: Has patient fallen in last 6 months? 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  In her free time doing things around the house  PATIENT GOALS: Get the scar better as well as her motion and strength to be able to do her job and things around the house  NEXT MD VISIT: ?  OBJECTIVE:  Note: Objective measures were completed at Evaluation unless otherwise noted.  HAND DOMINANCE: Right  ADLs: Patient limited in use of right dominant hand because of incision not closed and guarding as well as increased edema and decreased motion and increased pain   FUNCTIONAL OUTCOME MEASURES: Next session   UPPER EXTREMITY ROM:     Active ROM Right eval Left eval R 06/30/23 R 07/24/23 R 08/15/23  Shoulder flexion       Shoulder abduction       Shoulder adduction       Shoulder extension       Shoulder internal rotation       Shoulder external rotation       Elbow flexion       Elbow extension       Wrist flexion 40  42 65 70 80  Wrist extension 70 digits bend 66, digits slightly flexed 65 70 70  Wrist ulnar deviation       Wrist radial deviation       Wrist pronation       Wrist supination       (Blank rows = not tested)  Active ROM Right eval Left eval R 06/25/23 R  06/30/23 R 07/08/23 R 07/10/23 R 07/24/23 R 08/14/23  Thumb MCP (0-60)          Thumb IP (0-80)          Thumb Radial abd/add (0-55) 42    55      Thumb Palmar abd/add (0-45) 50    50      Thumb Opposition to Small Finger           Index MCP (0-90) 50   60 65 60 65 60 70  Index PIP (0-100) 70   85 95 95  90 90  Index DIP (0-70)            Long MCP (0-90) 45    55 60 60 70 60 75  Long PIP (0-100)  90   80 80 95  95 95  Long DIP (0-70)             Ring MCP (0-90) 60    60 60 60 70 65 80  Ring PIP (0-100) 80    80 95 100  100 100  Ring DIP (0-70)            Little MCP (0-90) 60    55 65 60 70 70 80  Little PIP (0-100) 80    80 85 95  100 95  Little DIP (0-70)            (Blank rows = not tested)   HAND FUNCTION:  Grip strength: Right: 10 lbs; Left: 35 lbs, Lateral pinch: Right: 8 lbs, Left: 12 lbs, and 3 point pinch: Right: 4 lbs, Left: 12 lbs 07/14/23 Grip strength: Right: 12lbs; Left: 35 lbs, Lateral pinch: Right: 8 lbs, Left: 12 lbs, and 3 point pinch: Right: 5 lbs, Left: 12 lbs 07/24/23 Grip strength: Right: 16lbs; Left: 35 lbs, Lateral pinch: Right: 8 lbs, Left: 12 lbs, and 3 point pinch: Right: 5 lbs, Left: 12 lbs 08/12/2023 Grip right 18#, lateral pinch 11#, 3 point pinch 7# 08/15/23 Grip strength: Right: 20lbs; Left: 35 lbs, Lateral pinch: Right: 12 lbs, Left: 12 lbs, and 3 point pinch: Right: 7 lbs, Left: 12 lbs 08/18/23 Grip strength: Right: 22lbs; Left: 35 lbs, Lateral pinch: Right: 12 lbs, Left: 12 lbs, and 3 point pinch: Right: 9 lbs, Left: 12 lbs 08/25/23 Grip strength: Right: 26 lbs; Left: 35 lbs, Lateral pinch: Right: 14 lbs, Left: 12 lbs, and 3 point pinch: Right: 10 lbs, Left: 12 lbs 08/28/23 Grip strength: Right: 27 lbs; Left: 35 lbs, Lateral pinch: Right: 14 lbs, Left: 12 lbs, and 3 point pinch: Right: 10 lbs, Left: 12 lbs 09/02/23 Grip strength: Right: 27 lbs; Left: 45 lbs, Lateral pinch: Right: 14 lbs, Left: 12 lbs, and 3 point pinch: Right: 10 lbs, Left: 12 lbs COORDINATION: Impaired because of increased swelling and pain in stiffness  SENSATION: Numbness in 2nd, 3rd and 4th digits with third throwers  EDEMA: edema on the right hand  COGNITION: Overall cognitive status: Within functional limits for tasks assessed  TREATMENT DATE: 09/05/23    Patient reports stiffness and pain at 3rd PIP at night and some mornings -   DIP at times also tender  But after washing some dishes in warm water and doing her range of  motion exercises improving.    Numbness in 3rd and 4th digit continues to improve. Numbness now mostly at [facility] of 3rd and 4th.  3 weeks ago was also in the middle phalanges.  Patient can do compression around the PIP with Coban to be pain-free.   Therapeutic Exercises:   Patient to continue with contrast at home and tendon glides as needed  Patient  to increase to 3 lbs for for supination pronation and radial ulnar deviation  1 x 12 reps 3 pounds for elbow flexion 1 sets 12 reps 1 time a day Rowing 3 lbs 12 reps Sit<> stand with 4 lbs holding 2 x 12 reps  Shoulder flexion - on roller on wall over head - 12 reps  Stabilization for shoulder at 90 flexion, wrist ext and digits extention - on 1 kg ball with 2 x 1 min  Some discomfort at wrist - pain  But done some joint mobs to Carpal rolls - 10 reps Pain decrease after joint mobs Tricep 5 lbs on [employer]  UBE 6 min - 2 min BW and FW- and 1 min BW and FW  No pain   Continue with table slides 20 reps      Cica -Care scar pad at nighttime Isotoner glove at nighttime and as needed during the day Silicone sleeve for  compression on the third digit during the day.  PATIENT EDUCATION: Education details: findings of eval and HEP  Person educated: Patient Education method: Explanation, Demonstration, Tactile cues, Verbal cues, and Handouts Education comprehension: verbalized understanding, returned demonstration, verbal cues required, and needs further education    GOALS: Goals reviewed with patient? Yes   LONG TERM GOALS: Target date: 8 wks  Patient be independent in home program to decrease edema and pain for patient to increase digit flexion to touching palm. Baseline: Incision not closed.  Increased edema and pain increased to 8-10/10 in the third digit; MC flexion 45 to 60 degrees and PIP 70-90 Goal status: Met  2.  Wrist flexion extension on the right improved to within normal limits for patient to push and pull heavy door  as well as turn doorknob without increased symptoms Baseline: Wrist flexion is 40 degrees and extension 70 with loose fist patient not using hand because of incision not closed Goal status: Progressing  3.  Incisional right hand improved for patient to initiate scar massage as well as being able to tolerate different textures including tapping and clapping hands with no increase symptoms Baseline: Patient report incision not closed since last time.  Steri-Strips came off and incision opening with digit extension.  Steri-Strips applied if not staying in place patient to contact orthopedics in the next 24 to 48 hours.  Keep home exercises without opening or putting stress on incision Goal status: Met  4.  Right grip and prehension strength improve to more than 75% compared to the left for patient to return to using hand normally at home and cooking and laundry and cleaning activities without increase symptoms Baseline: Patient is about 3 weeks postop.  Patient with increased edema and pain and stiffness.  Incision not closed-not tested Goal status: Progressing   ASSESSMENT:  CLINICAL IMPRESSION: Patient seen for occupational therapy for right arthroscopic carpal tunnel release and third digit trigger finger release by Dr. Edie -patient had surgery 05/28/2023.  Patient making great progress in active range of motion of all digits.  Within normal limits compared to the left hand.  Patient limited mostly in pain at PIP joint.  Mostly at nighttime or with composite grip against resistance.  Patient does better with light compression, and around PIP.  Making progress slow but steady in grip and prehension strength.  Sensation improving-numbness only on DIP of 3rd and 4th this date.  Patient tested normal on [employer] although reports diminished sensation in distal phalanges of 3rd and 4th.  Patient upgraded to medium firm green putty for gripping and 3-point pinch but with a light Coban wrap around  PIP of third digit to decrease pain.  Can do 2 sets of 12 pain-free 2 times a day. 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  Pt did undergo bariatric surgery  on the 10th July has 4 small incisions in abdominal region, she was not able to perform her full scope of HEP during that time -patient can do light strengthening that does not involve abdominals until 6 weeks.  She is doing well and still able to progress with exercises.  Patient report increased functional use dropping objects less.  Patient limited in functional use of right dominant hand in ADLs and IADLs.  Patient can benefit from  skilled OT services to decrease edema, pain, scar tissue and increased motion and strength to return to prior level of function  PERFORMANCE DEFICITS: in functional skills including ADLs, IADLs, ROM, strength, pain, flexibility, decreased knowledge of use of DME, and UE functional use,   and psychosocial skills including environmental adaptation and routines and behaviors.   IMPAIRMENTS: are limiting patient from ADLs, IADLs, rest and sleep, play, leisure, and social participation.   COMORBIDITIES: has no other co-morbidities that affects occupational performance. Patient will benefit from skilled OT to address above impairments and improve overall function.  MODIFICATION OR ASSISTANCE TO COMPLETE EVALUATION: No modification of tasks or assist necessary to complete an evaluation.  OT OCCUPATIONAL PROFILE AND HISTORY: Problem focused assessment: Including review of records relating to presenting problem.  CLINICAL DECISION MAKING: LOW - limited treatment options, no task modification necessary  REHAB POTENTIAL: Good for goals  EVALUATION COMPLEXITY: Low    PLAN:  OT FREQUENCY: 1-2x/week  OT DURATION: 4 wks   PLANNED INTERVENTIONS:  97168 OT Re-evaluation, 97535 self care/ADL training, 02889 therapeutic exercise, 97530 therapeutic activity, 97140 manual therapy, 97018 paraffin, 02960 fluidotherapy, 97034 contrast bath, scar mobilization, passive range of motion, patient/family education, and DME and/or AE instructions  CONSULTED AND AGREED WITH PLAN OF CARE: Patient   Ancel Peters, OTR/L,CLT 09/05/2023, 10:50 AM

## 2023-09-09 ENCOUNTER — Ambulatory Visit: Admitting: Occupational Therapy

## 2023-09-09 DIAGNOSIS — K859 Acute pancreatitis without necrosis or infection, unspecified: Secondary | ICD-10-CM | POA: Diagnosis not present

## 2023-09-09 DIAGNOSIS — M6281 Muscle weakness (generalized): Secondary | ICD-10-CM

## 2023-09-09 DIAGNOSIS — M79641 Pain in right hand: Secondary | ICD-10-CM

## 2023-09-09 DIAGNOSIS — R600 Localized edema: Secondary | ICD-10-CM

## 2023-09-09 DIAGNOSIS — M25631 Stiffness of right wrist, not elsewhere classified: Secondary | ICD-10-CM

## 2023-09-09 DIAGNOSIS — M25641 Stiffness of right hand, not elsewhere classified: Secondary | ICD-10-CM

## 2023-09-09 DIAGNOSIS — L905 Scar conditions and fibrosis of skin: Secondary | ICD-10-CM

## 2023-09-09 NOTE — Therapy (Signed)
 OUTPATIENT OCCUPATIONAL THERAPY ORTHO TREATMENT   Patient Name: Taylor Valencia MRN: [id number] DOB:1970/05/14, 53 y.o., female Today's Date: 09/09/2023  PCP: Dr Sadie MART PROVIDER: Kip PA  END OF SESSION:  OT End of Session - 09/09/23 1122     Visit Number 22    Number of Visits 26    Date for OT Re-Evaluation 09/22/23    OT Start Time 1123    OT Stop Time 1204    OT Time Calculation (min) 41 min    Activity Tolerance Patient tolerated treatment well    Behavior During Therapy WFL for tasks assessed/performed            Past Medical History:  Diagnosis Date   Arthritis    Chronic back pain    History of bronchitis    Hypertension    Pre-diabetes    Past Surgical History:  Procedure Laterality Date   BOWEL RESECTION     patient had some muscle removed    CARPAL TUNNEL RELEASE Right 05/28/2023   Procedure: RELEASE, CARPAL TUNNEL, ENDOSCOPIC;  Surgeon: Edie Norleen PARAS, MD;  Location: [facility] OR[facility];  Service: Orthopedics;  Laterality: Right;   CHOLECYSTECTOMY     COLONOSCOPY WITH PROPOFOL  N/A 03/11/2022   Procedure: COLONOSCOPY WITH PROPOFOL ;  Surgeon: Maryruth Ole DASEN, MD;  Location: [facility] END[facility];  Service: Endoscopy;  Laterality: N/A;   EYE SURGERY     lasix surgery   LUMBAR LAMINECTOMY/DECOMPRESSION MICRODISCECTOMY N/A 08/24/2015   Procedure: Thoracic nine- ten, Thoracic ten- eleven Laminectomy;  Surgeon: Reyes Budge, MD;  Location: [facility];  Service: Neurosurgery;  Laterality: N/A;  T9-10, T10-11 Laminectomy   TONSILLECTOMY     TRIGGER FINGER RELEASE Right 05/28/2023   Procedure: RELEASE, A1 PULLEY, FOR TRIGGER FINGER;  Surgeon: Edie Norleen PARAS, MD;  Location: [facility] OR[facility];  Service: Orthopedics;  Laterality: Right;   Patient Active Problem List   Diagnosis Date Noted   Essential hypertension 12/22/2018   Morbid obesity (HCC) 12/22/2018   Depression 12/22/2018   Thoracic myelopathy 08/24/2015    ONSET DATE: 05/28/23  REFERRING DIAG: R CTS  and 3rd trigger finger release  THERAPY DIAG:  Stiffness of right hand, not elsewhere classified  Pain in right hand  Muscle weakness (generalized)  Stiffness of right wrist, not elsewhere classified  Localized edema  Scar condition and fibrosis of skin  Rationale for Evaluation and Treatment: Rehabilitation  SUBJECTIVE:   SUBJECTIVE STATEMENT: My back was hurting over the weekend.  I always had back pain.  But now that I am not on pain medicine and staff my hand hurts in the morning a little bit more and then also my back.  The middle finger joint still hurts.  But after washing dishes in the mornings better Pt accompanied by: self  PERTINENT HISTORY: Impression: Carpal tunnel syndrome, right [G56.01] Carpal tunnel syndrome, right (primary encounter diagnosis) Trigger middle finger of right hand  Plan:  1. Treatment options were discussed today with the patient. 2. Sutures removed at today's visit, benzoin and Steri-Strips were applied. The patient was instructed that she may begin the shower at this time. 3. She was given hand exercises to perform at home at this time. She was instructed to begin massaging the incision sites later this week to decrease chances of scar tissue development. 4. The patient was instructed to continue to wear the Velcro wrist splint for 1 more week at night but she does not need to wear it during today. Gradually increase activities as tolerated  to the right hand. 5. Given her limitations with flexion at today's visit, a referral for formal occupational therapy was placed to the patient. A work note was provided keeping the patient out until her next follow-up appointment. 6. The patient will follow-up in 1 month with Dr. Edie for repeat evaluation of the right hand. They can call the clinic they have any questions, new symptoms develop or symptoms worsen.   PRECAUTIONS: None  WEIGHT BEARING RESTRICTIONS: No  PAIN:  Are you having pain?  Right  third digit PIP 4/10 with a tight fist or pressure  FALLS: Has patient fallen in last 6 months? 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  In her free time doing things around the house  PATIENT GOALS: Get the scar better as well as her motion and strength to be able to do her job and things around the house  NEXT MD VISIT: ?  OBJECTIVE:  Note: Objective measures were completed at Evaluation unless otherwise noted.  HAND DOMINANCE: Right  ADLs: Patient limited in use of right dominant hand because of incision not closed and guarding as well as increased edema and decreased motion and increased pain   FUNCTIONAL OUTCOME MEASURES: Next session   UPPER EXTREMITY ROM:     Active ROM Right eval Left eval R 06/30/23 R 07/24/23 R 08/15/23  Shoulder flexion       Shoulder abduction       Shoulder adduction       Shoulder extension       Shoulder internal rotation       Shoulder external rotation       Elbow flexion       Elbow extension       Wrist flexion 40  42 65 70 80  Wrist extension 70 digits bend 66, digits slightly flexed 65 70 70  Wrist ulnar deviation       Wrist radial deviation       Wrist pronation       Wrist supination       (Blank rows = not tested)  Active ROM Right eval Left eval R 06/25/23 R  06/30/23 R 07/08/23 R 07/10/23 R 07/24/23 R 08/14/23  Thumb MCP (0-60)          Thumb IP (0-80)          Thumb Radial abd/add (0-55) 42    55      Thumb Palmar abd/add (0-45) 50    50      Thumb Opposition to Small Finger           Index MCP (0-90) 50   60 65 60 65 60 70  Index PIP (0-100) 70   85 95 95  90 90  Index DIP (0-70)            Long MCP (0-90) 45    55 60 60 70 60 75  Long PIP (0-100)  90   80 80 95  95 95  Long DIP (0-70)            Ring MCP (0-90) 60    60 60 60 70 65 80  Ring PIP (0-100) 80    80 95 100  100 100  Ring DIP (0-70)            Little MCP  (0-90) 60    55 65 60 70 70 80  Little PIP (0-100) 80    80 85 95  100 95  Little DIP (0-70)            (Blank rows = not tested)   HAND FUNCTION:  Grip strength: Right: 10 lbs; Left: 35 lbs, Lateral pinch: Right: 8 lbs, Left: 12 lbs, and 3 point pinch: Right: 4 lbs, Left: 12 lbs 07/14/23 Grip strength: Right: 12lbs; Left: 35 lbs, Lateral pinch: Right: 8 lbs, Left: 12 lbs, and 3 point pinch: Right: 5 lbs, Left: 12 lbs 07/24/23 Grip strength: Right: 16lbs; Left: 35 lbs, Lateral pinch: Right: 8 lbs, Left: 12 lbs, and 3 point pinch: Right: 5 lbs, Left: 12 lbs 08/12/2023 Grip right 18#, lateral pinch 11#, 3 point pinch 7# 08/15/23 Grip strength: Right: 20lbs; Left: 35 lbs, Lateral pinch: Right: 12 lbs, Left: 12 lbs, and 3 point pinch: Right: 7 lbs, Left: 12 lbs 08/18/23 Grip strength: Right: 22lbs; Left: 35 lbs, Lateral pinch: Right: 12 lbs, Left: 12 lbs, and 3 point pinch: Right: 9 lbs, Left: 12 lbs 08/25/23 Grip strength: Right: 26 lbs; Left: 35 lbs, Lateral pinch: Right: 14 lbs, Left: 12 lbs, and 3 point pinch: Right: 10 lbs, Left: 12 lbs 08/28/23 Grip strength: Right: 27 lbs; Left: 35 lbs, Lateral pinch: Right: 14 lbs, Left: 12 lbs, and 3 point pinch: Right: 10 lbs, Left: 12 lbs 09/02/23 Grip strength: Right: 27 lbs; Left: 45 lbs, Lateral pinch: Right: 14 lbs, Left: 12 lbs, and 3 point pinch: Right: 10 lbs, Left: 12 lbs COORDINATION: Impaired because of increased swelling and pain in stiffness  SENSATION: Numbness in 2nd, 3rd and 4th digits with third throwers  EDEMA: edema on the right hand  COGNITION: Overall cognitive status: Within functional limits for tasks assessed  TREATMENT DATE: 09/08/23    Patient reports stiffness and pain at 3rd PIP at night and some mornings -   DIP at times also tender  But after washing some dishes in warm water and doing her range of motion exercises improving.    Numbness in 3rd and 4th digit continues to improve. Numbness now mostly at [facility] of 3rd and 4th.   3 weeks ago was also in the middle phalanges.  Patient can do compression around the PIP with Coban to be pain-free.  Appear patient had some pain after last time sit and stand and lower back.  Patient has a long history of back pain.  Upon assessment today appear patient using her back instead of sit and stand through the hips and the feet. Patient to hold off.   Paraffin bath done 8 minutes to right hand to decrease pain and stiffness in joints as well as prior to scar mobilization today. Patient tolerated well had decreased pain.   Scar mobilization done with Graston #4 for brushing over volar digits as well as in the palm and over the scar.  As well as volar radial and dorsal forearm with sweeping techniques  Mini massager on both volar scars.  Patient with increased soreness at the end but improved fibrosis and scar tissue. Soft tissue mobilization done to metacarpal spreads and webspace 10 reps each   Held off on strengthening this date. Done composite wrist flexion extension stretches with extended arm 5 reps each 5-second holds In neutral , pronation and supination     UBE 6 min - 2 min BW and FW- and 1 min BW and FW      Cica -Care scar pad at nighttime Isotoner glove at nighttime and  as needed during the day Silicone sleeve for compression on the third digit during the day.  PATIENT EDUCATION: Education details: findings of eval and HEP  Person educated: Patient Education method: Explanation, Demonstration, Tactile cues, Verbal cues, and Handouts Education comprehension: verbalized understanding, returned demonstration, verbal cues required, and needs further education    GOALS: Goals reviewed with patient? Yes   LONG TERM GOALS: Target date: 8 wks  Patient be independent in home program to decrease edema and pain for patient to increase digit flexion to touching palm. Baseline: Incision not closed.  Increased edema and pain increased to 8-10/10 in the third  digit; MC flexion 45 to 60 degrees and PIP 70-90 Goal status: Met  2.  Wrist flexion extension on the right improved to within normal limits for patient to push and pull heavy door as well as turn doorknob without increased symptoms Baseline: Wrist flexion is 40 degrees and extension 70 with loose fist patient not using hand because of incision not closed Goal status: Progressing  3.  Incisional right hand improved for patient to initiate scar massage as well as being able to tolerate different textures including tapping and clapping hands with no increase symptoms Baseline: Patient report incision not closed since last time.  Steri-Strips came off and incision opening with digit extension.  Steri-Strips applied if not staying in place patient to contact orthopedics in the next 24 to 48 hours.  Keep home exercises without opening or putting stress on incision Goal status: Met  4.  Right grip and prehension strength improve to more than 75% compared to the left for patient to return to using hand normally at home and cooking and laundry and cleaning activities without increase symptoms Baseline: Patient is about 3 weeks postop.  Patient with increased edema and pain and stiffness.  Incision not closed-not tested Goal status: Progressing   ASSESSMENT:  CLINICAL IMPRESSION: Patient seen for occupational therapy for right arthroscopic carpal tunnel release and third digit trigger finger release by Dr. Edie -patient had surgery 05/28/2023.  Patient making great progress in active range of motion of all digits.  Within normal limits compared to the left hand.  Patient limited mostly in pain at PIP joint.  Mostly at nighttime or with composite grip against resistance.  Patient does better with light compression, and around PIP.  Making progress slow but steady in grip and prehension strength.  Sensation improving-numbness only on DIP of 3rd and 4th this date.  Patient tested normal on [employer]  although reports diminished sensation in distal phalanges of 3rd and 4th.  Patient use medium firm green putty for gripping and 3-point pinch but with a light Coban wrap around PIP of third digit to decrease pain.  Can do 2 sets of 12 pain-free 2 times a day. 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  Pt did undergo bariatric surgery  on the 10th July has 4 small incisions in abdominal region, she was not able to perform her full scope of HEP during that time -patient can do light strengthening that does not involve abdominals until 6 weeks.  She is doing well and still able to progress with exercises.  Patient do have a history of back pain.  Patient report increased functional use dropping objects less.  Patient limited  in functional use of right dominant hand in ADLs and IADLs.  Patient can benefit from skilled OT services to decrease edema, pain, scar tissue and increased motion and strength to return to prior level of function  PERFORMANCE DEFICITS: in functional skills including ADLs, IADLs, ROM, strength, pain, flexibility, decreased knowledge of use of DME, and UE functional use,   and psychosocial skills including environmental adaptation and routines and behaviors.   IMPAIRMENTS: are limiting patient from ADLs, IADLs, rest and sleep, play, leisure, and social participation.   COMORBIDITIES: has no other co-morbidities that affects occupational performance. Patient will benefit from skilled OT to address above impairments and improve overall function.  MODIFICATION OR ASSISTANCE TO COMPLETE EVALUATION: No modification of tasks or assist necessary to complete an evaluation.  OT OCCUPATIONAL PROFILE AND HISTORY: Problem focused assessment: Including review of records relating to presenting problem.  CLINICAL DECISION  MAKING: LOW - limited treatment options, no task modification necessary  REHAB POTENTIAL: Good for goals  EVALUATION COMPLEXITY: Low    PLAN:  OT FREQUENCY: 1-2x/week  OT DURATION: 4 wks   PLANNED INTERVENTIONS: 97168 OT Re-evaluation, 97535 self care/ADL training, 02889 therapeutic exercise, 97530 therapeutic activity, 97140 manual therapy, 97018 paraffin, 02960 fluidotherapy, 97034 contrast bath, scar mobilization, passive range of motion, patient/family education, and DME and/or AE instructions  CONSULTED AND AGREED WITH PLAN OF CARE: Patient   Ancel Peters, OTR/L,CLT 09/09/2023, 1:11 PM

## 2023-09-11 ENCOUNTER — Emergency Department

## 2023-09-11 ENCOUNTER — Inpatient Hospital Stay
Admission: EM | Admit: 2023-09-11 | Discharge: 2023-09-15 | DRG: 439 | Disposition: A | Discharge status: Home / Self Care | Attending: Hospitalist | Admitting: Hospitalist

## 2023-09-11 ENCOUNTER — Other Ambulatory Visit: Payer: Self-pay

## 2023-09-11 ENCOUNTER — Encounter: Payer: Self-pay | Admitting: Emergency Medicine

## 2023-09-11 DIAGNOSIS — I10 Essential (primary) hypertension: Secondary | ICD-10-CM | POA: Diagnosis present

## 2023-09-11 DIAGNOSIS — K851 Biliary acute pancreatitis without necrosis or infection: Secondary | ICD-10-CM

## 2023-09-11 DIAGNOSIS — Z9884 Bariatric surgery status: Secondary | ICD-10-CM

## 2023-09-11 DIAGNOSIS — K859 Acute pancreatitis without necrosis or infection, unspecified: Principal | ICD-10-CM | POA: Diagnosis present

## 2023-09-11 DIAGNOSIS — Z79899 Other long term (current) drug therapy: Secondary | ICD-10-CM

## 2023-09-11 DIAGNOSIS — Z9049 Acquired absence of other specified parts of digestive tract: Secondary | ICD-10-CM

## 2023-09-11 DIAGNOSIS — R748 Abnormal levels of other serum enzymes: Secondary | ICD-10-CM | POA: Diagnosis present

## 2023-09-11 DIAGNOSIS — R7303 Prediabetes: Secondary | ICD-10-CM | POA: Diagnosis present

## 2023-09-11 DIAGNOSIS — M549 Dorsalgia, unspecified: Secondary | ICD-10-CM | POA: Diagnosis present

## 2023-09-11 DIAGNOSIS — Z833 Family history of diabetes mellitus: Secondary | ICD-10-CM

## 2023-09-11 DIAGNOSIS — Z6841 Body Mass Index (BMI) 40.0 and over, adult: Secondary | ICD-10-CM

## 2023-09-11 DIAGNOSIS — G8929 Other chronic pain: Secondary | ICD-10-CM | POA: Diagnosis present

## 2023-09-11 DIAGNOSIS — K219 Gastro-esophageal reflux disease without esophagitis: Secondary | ICD-10-CM | POA: Diagnosis present

## 2023-09-11 LAB — CBC
HCT: 42.7 % (ref 36.0–46.0)
Hemoglobin: 14 g/dL (ref 12.0–15.0)
MCH: 28.2 pg (ref 26.0–34.0)
MCHC: 32.8 g/dL (ref 30.0–36.0)
MCV: 86.1 fL (ref 80.0–100.0)
Platelets: 274 K/uL (ref 150–400)
RBC: 4.96 MIL/uL (ref 3.87–5.11)
RDW: 13.2 % (ref 11.5–15.5)
WBC: 9.8 K/uL (ref 4.0–10.5)
nRBC: 0 % (ref 0.0–0.2)

## 2023-09-11 LAB — URINALYSIS, ROUTINE W REFLEX MICROSCOPIC
Bilirubin Urine: NEGATIVE
Glucose, UA: NEGATIVE mg/dL
Hgb urine dipstick: NEGATIVE
Ketones, ur: 5 mg/dL — AB
Nitrite: NEGATIVE
Protein, ur: NEGATIVE mg/dL
Specific Gravity, Urine: >1.046 — ABNORMAL HIGH (ref 1.005–1.030)
pH: 6 (ref 5.0–8.0)

## 2023-09-11 LAB — COMPREHENSIVE METABOLIC PANEL WITH GFR
ALT: 46 U/L — ABNORMAL HIGH (ref 0–44)
AST: 61 U/L — ABNORMAL HIGH (ref 15–41)
Albumin: 4.1 g/dL (ref 3.5–5.0)
Alkaline Phosphatase: 136 U/L — ABNORMAL HIGH (ref 38–126)
Anion gap: 12 (ref 5–15)
BUN: 13 mg/dL (ref 6–20)
CO2: 23 mmol/L (ref 22–32)
Calcium: 9.4 mg/dL (ref 8.9–10.3)
Chloride: 105 mmol/L (ref 98–111)
Creatinine, Ser: 0.65 mg/dL (ref 0.44–1.00)
GFR, Estimated: >60 mL/min (ref >60)
Glucose, Bld: 131 mg/dL — ABNORMAL HIGH (ref 70–99)
Potassium: 3.9 mmol/L (ref 3.5–5.1)
Sodium: 140 mmol/L (ref 135–145)
Total Bilirubin: 1.1 mg/dL (ref 0.0–1.2)
Total Protein: 7.9 g/dL (ref 6.5–8.1)

## 2023-09-11 LAB — LIPASE, BLOOD: Lipase: 364 U/L — ABNORMAL HIGH (ref 11–51)

## 2023-09-11 LAB — TRIGLYCERIDES: Triglycerides: 119 mg/dL (ref <150)

## 2023-09-11 MED ORDER — ENOXAPARIN SODIUM 60 MG/0.6ML IJ SOSY
0.5000 mg/kg | PREFILLED_SYRINGE | INTRAMUSCULAR | Status: DC
  Administered 2023-09-11 – 2023-09-14 (×4): 57.5 mg via SUBCUTANEOUS
  Filled 2023-09-11 (×4): qty 0.6

## 2023-09-11 MED ORDER — HYDROMORPHONE HCL 1 MG/ML IJ SOLN
1.0000 mg | INTRAMUSCULAR | Status: DC | PRN
  Administered 2023-09-11 – 2023-09-12 (×2): 1 mg via INTRAVENOUS
  Filled 2023-09-11 (×2): qty 1

## 2023-09-11 MED ORDER — SODIUM CHLORIDE 0.9 % IV BOLUS
1000.0000 mL | Freq: Once | INTRAVENOUS | Status: AC
  Administered 2023-09-11: 1000 mL via INTRAVENOUS

## 2023-09-11 MED ORDER — ACETAMINOPHEN 650 MG RE SUPP: 650.0000 mg | Freq: Four times a day (QID) | RECTAL | Status: DC | PRN

## 2023-09-11 MED ORDER — ONDANSETRON HCL 4 MG PO TABS: 4.0000 mg | ORAL_TABLET | Freq: Four times a day (QID) | ORAL | Status: DC | PRN

## 2023-09-11 MED ORDER — PANTOPRAZOLE SODIUM 40 MG IV SOLR
40.0000 mg | Freq: Two times a day (BID) | INTRAVENOUS | Status: DC
  Administered 2023-09-11 – 2023-09-12 (×3): 40 mg via INTRAVENOUS
  Filled 2023-09-11 (×3): qty 10

## 2023-09-11 MED ORDER — ACETAMINOPHEN 325 MG PO TABS
650.0000 mg | ORAL_TABLET | Freq: Four times a day (QID) | ORAL | Status: DC | PRN
  Filled 2023-09-11: qty 2

## 2023-09-11 MED ORDER — POLYETHYLENE GLYCOL 3350 17 G PO PACK
17.0000 g | PACK | Freq: Every day | ORAL | Status: DC | PRN
  Filled 2023-09-11: qty 1

## 2023-09-11 MED ORDER — ENOXAPARIN SODIUM 40 MG/0.4ML IJ SOSY: 40.0000 mg | PREFILLED_SYRINGE | INTRAMUSCULAR | Status: DC

## 2023-09-11 MED ORDER — ONDANSETRON HCL 4 MG/2ML IJ SOLN
4.0000 mg | Freq: Once | INTRAMUSCULAR | Status: AC
Start: 2023-09-11 — End: 2023-09-11
  Administered 2023-09-11: 4 mg via INTRAVENOUS
  Filled 2023-09-11: qty 2

## 2023-09-11 MED ORDER — IOHEXOL 300 MG/ML  SOLN
100.0000 mL | Freq: Once | INTRAMUSCULAR | Status: AC | PRN
  Administered 2023-09-11: 100 mL via INTRAVENOUS

## 2023-09-11 MED ORDER — MORPHINE SULFATE (PF) 4 MG/ML IV SOLN
4.0000 mg | Freq: Once | INTRAVENOUS | Status: AC
  Administered 2023-09-11: 4 mg via INTRAVENOUS
  Filled 2023-09-11: qty 1

## 2023-09-11 MED ORDER — SODIUM CHLORIDE 0.9 % IV SOLN: INTRAVENOUS | Status: DC

## 2023-09-11 MED ORDER — ONDANSETRON HCL 4 MG/2ML IJ SOLN
4.0000 mg | Freq: Four times a day (QID) | INTRAMUSCULAR | Status: DC | PRN
  Administered 2023-09-11 – 2023-09-15 (×4): 4 mg via INTRAVENOUS
  Filled 2023-09-11 (×4): qty 2

## 2023-09-11 NOTE — ED Triage Notes (Signed)
 Pt here with abd pain and vomiting. Pt had bariatric surgery in [place] on August 07, 2023. Pt states she started feeling bad on this past Monday.  Pt states her pain is more left sided. Pt has been taking omeprazole.

## 2023-09-11 NOTE — ED Provider Notes (Signed)
 [facility] Provider Note    Event Date/Time   First MD Initiated Contact with Patient 09/11/23 1012     (approximate)   History   Abdominal Pain and Nausea   HPI  Taylor Valencia is a 53 y.o. female with a past medical history of hypertension and obesity presents today for evaluation of epigastric abdominal pain.  Patient reports that this began on Monday.  She reports that she has had vomiting every time she eats ever since.  She has not had a fever.  She reports that she had a cholecystectomy in 2014.  She also recently had bariatric surgery in July in [place].  She denies having had any problems with the surgery.  No urinary symptoms.  No fevers or chills.  She denies drinking alcohol.  Patient Active Problem List   Diagnosis Date Noted   Acute pancreatitis 09/11/2023   Essential hypertension 12/22/2018   Morbid obesity (HCC) 12/22/2018   Depression 12/22/2018   Thoracic myelopathy 08/24/2015          Physical Exam   Triage Vital Signs: ED Triage Vitals [09/11/23 0855]  Encounter Vitals Group     BP (!) 141/88     Girls Systolic BP Percentile      Girls Diastolic BP Percentile      Boys Systolic BP Percentile      Boys Diastolic BP Percentile      Pulse Rate 90     Resp 19     Temp 98.6 F (37 C)     Temp Source Oral     SpO2 95 %     Weight 253 lb 15.5 oz (115.2 kg)     Height 5' (1.524 m)     Head Circumference      Peak Flow      Pain Score 7     Pain Loc      Pain Education      Exclude from Growth Chart     Most recent vital signs: Vitals:   09/11/23 0855 09/11/23 1302  BP: (!) 141/88 138/80  Pulse: 90 88  Resp: 19 16  Temp: 98.6 F (37 C) 98 F (36.7 C)  SpO2: 95% 96%    Physical Exam Vitals and nursing note reviewed.  Constitutional:      General: Awake and alert. No acute distress.    Appearance: Normal appearance.   HENT:     Head: Normocephalic and atraumatic.     Mouth: Mucous membranes are  moist.  Eyes:     General: PERRL. Normal EOMs        Right eye: No discharge.        Left eye: No discharge.     Conjunctiva/sclera: Conjunctivae normal.  Cardiovascular:     Rate and Rhythm: Normal rate and regular rhythm.     Pulses: Normal pulses.  Pulmonary:     Effort: Pulmonary effort is normal. No respiratory distress.     Breath sounds: Normal breath sounds.  Abdominal:     Abdomen is soft. There is epigastric abdominal tenderness. No rebound or guarding. No distention.  No CVA tenderness Musculoskeletal:        General: No swelling. Normal range of motion.     Cervical back: Normal range of motion and neck supple.  Skin:    General: Skin is warm and dry.     Capillary Refill: Capillary refill takes less than 2 seconds.     Findings: No rash.  Neurological:     Mental Status: The patient is awake and alert.      ED Results / Procedures / Treatments   Labs (all labs ordered are listed, but only abnormal results are displayed) Labs Reviewed  LIPASE, BLOOD - Abnormal; Notable for the following components:      Result Value   Lipase 364 (*)    All other components within normal limits  COMPREHENSIVE METABOLIC PANEL WITH GFR - Abnormal; Notable for the following components:   Glucose, Bld 131 (*)    AST 61 (*)    ALT 46 (*)    Alkaline Phosphatase 136 (*)    All other components within normal limits  CBC  TRIGLYCERIDES  URINALYSIS, ROUTINE W REFLEX MICROSCOPIC  HIV ANTIBODY (ROUTINE TESTING W REFLEX)     EKG     RADIOLOGY I independently reviewed and interpreted imaging and agree with radiologists findings.     PROCEDURES:  Critical Care performed:   Procedures   MEDICATIONS ORDERED IN ED: Medications  acetaminophen  (TYLENOL ) tablet 650 mg (has no administration in time range)    Or  acetaminophen  (TYLENOL ) suppository 650 mg (has no administration in time range)  polyethylene glycol (MIRALAX  / GLYCOLAX ) packet 17 g (has no administration in  time range)  ondansetron  (ZOFRAN ) tablet 4 mg (has no administration in time range)    Or  ondansetron  (ZOFRAN ) injection 4 mg (has no administration in time range)  0.9 %  sodium chloride  infusion (has no administration in time range)  pantoprazole  (PROTONIX ) injection 40 mg (has no administration in time range)  HYDROmorphone  (DILAUDID ) injection 1 mg (has no administration in time range)  enoxaparin  (LOVENOX ) injection 57.5 mg (has no administration in time range)  ondansetron  (ZOFRAN ) injection 4 mg (4 mg Intravenous Given 09/11/23 1043)  morphine  (PF) 4 MG/ML injection 4 mg (4 mg Intravenous Given 09/11/23 1043)  iohexol  (OMNIPAQUE ) 300 MG/ML solution 100 mL (100 mLs Intravenous Contrast Given 09/11/23 1210)  sodium chloride  0.9 % bolus 1,000 mL (1,000 mLs Intravenous New Bag/Given 09/11/23 1314)     IMPRESSION / MDM / ASSESSMENT AND PLAN / ED COURSE  I reviewed the triage vital signs and the nursing notes.   Differential diagnosis includes, but is not limited to, pancreatitis, cholecystitis, small bowel obstruction, postoperative complication such as seroma or hematoma.  I reviewed the patient's chart.  No recent emergency department visits.  Patient is awake and alert, hemodynamically stable and afebrile.  She has mild elevation in her lipase.   Mild elevation in her AST and ALT, however normal bili, do not suspect retained stone.  No biliary dilatation on CT scan.  Given her recent surgery, recommended further evaluation with a CT scan.  Patient is agreeable with this.  CT scan reveals findings consistent with acute pancreatitis without pseudocyst.  Given her pain and vomiting I recommended admission to the hospitalist service.  There is not a clear etiology to her pancreatitis today.  I added on a triglyceride level as well. This was normal. She was given a liter of IV fluids.  Patient understands and agrees with plan.  Consulted the hospitalist for admission. Patient accepted by Dr.  Paudel.   Patient's presentation is most consistent with acute presentation with potential threat to life or bodily function.    FINAL CLINICAL IMPRESSION(S) / ED DIAGNOSES   Final diagnoses:  Acute pancreatitis without infection or necrosis, unspecified pancreatitis type     Rx / DC Orders   ED Discharge Orders  None        Note:  This document was prepared using Dragon voice recognition software and may include unintentional dictation errors.   Eliu Batch E, PA-C 09/11/23 1439    Ernest Ronal BRAVO, MD 09/12/23 [phone]

## 2023-09-11 NOTE — Progress Notes (Signed)
 PHARMACIST - PHYSICIAN COMMUNICATION  CONCERNING:  Enoxaparin  (Lovenox ) for DVT Prophylaxis    RECOMMENDATION: Patient was prescribed enoxaprin 40mg  q24 hours for VTE prophylaxis.   Filed Weights   09/11/23 0855  Weight: 115.2 kg (253 lb 15.5 oz)    Body mass index is 49.6 kg/m.  Estimated Creatinine Clearance: 94.2 mL/min (by C-G formula based on SCr of 0.65 mg/dL).   Based on [facility] policy patient is candidate for enoxaparin  0.5mg /kg TBW SQ every 24 hours based on BMI being >30.   DESCRIPTION: Pharmacy has adjusted enoxaparin  dose per [facility] policy.  Patient is now receiving enoxaparin  57.5 mg every 24 hours    Atharv Barriere Rodriguez-Guzman PharmD, BCPS 09/11/2023 2:39 PM

## 2023-09-11 NOTE — H&P (Signed)
 History and Physical    Virjean Boman FMW:[id number] DOB: 11-Apr-1970 DOA: 09/11/2023  DOS: the patient was seen and examined on 09/11/2023  PCP: Sadie Manna, MD   Patient coming from: Home  I have personally briefly reviewed patient's old medical records in [facility]  Chief Complaint: Epigastric abdominal pain  HPI: Taylor Valencia is a pleasant 53 y.o. female with medical history significant for morbid obesity s/p gastric bypass a month ago in [place], HTN, prediabetes, chronic back pain, arthritis who presented to ED at [facility] for epigastric abdominal pain that started 4 days ago.  She reported that she had vomiting last night but contained food material and clear liquids no bile or blood.  She had a history of cholecystectomy in 2014.  She denies any drinking alcohol.  She denies any fever chills.  ED Course: Upon arrival to the ED, patient is found to acute pancreatitis with elevated lipase at 364.  CT scan of the abdomen showed findings suggestive of acute pancreatitis.  Lipid panels were not showing any hypertriglyceridemia.  Hospitalist service was consulted for evaluation for admission for possible acute pancreatitis.  Review of Systems:  ROS  All other systems negative except as noted in the HPI.  Past Medical History:  Diagnosis Date   Arthritis    Chronic back pain    History of bronchitis    Hypertension    Pre-diabetes     Past Surgical History:  Procedure Laterality Date   BOWEL RESECTION     patient had some muscle removed    CARPAL TUNNEL RELEASE Right 05/28/2023   Procedure: RELEASE, CARPAL TUNNEL, ENDOSCOPIC;  Surgeon: Edie Norleen PARAS, MD;  Location: [facility] ORS;  Service: Orthopedics;  Laterality: Right;   CHOLECYSTECTOMY     COLONOSCOPY WITH PROPOFOL  N/A 03/11/2022   Procedure: COLONOSCOPY WITH PROPOFOL ;  Surgeon: Maryruth Ole DASEN, MD;  Location: [facility] ENDOSCOPY;  Service: Endoscopy;  Laterality: N/A;   EYE SURGERY     lasix  surgery   LUMBAR LAMINECTOMY/DECOMPRESSION MICRODISCECTOMY N/A 08/24/2015   Procedure: Thoracic nine- ten, Thoracic ten- eleven Laminectomy;  Surgeon: Reyes Budge, MD;  Location: [facility] NEURO ORS;  Service: Neurosurgery;  Laterality: N/A;  T9-10, T10-11 Laminectomy   TONSILLECTOMY     TRIGGER FINGER RELEASE Right 05/28/2023   Procedure: RELEASE, A1 PULLEY, FOR TRIGGER FINGER;  Surgeon: Edie Norleen PARAS, MD;  Location: [facility] ORS;  Service: Orthopedics;  Laterality: Right;     reports that she has never smoked. She has never used smokeless tobacco. She reports that she does not drink alcohol and does not use drugs.  No Known Allergies  Family History  Problem Relation Age of Onset   Diabetes Mother    Diabetes Paternal Grandmother    Breast cancer Neg Hx     Prior to Admission medications   Medication Sig Start Date End Date Taking? Authorizing Provider  acetaminophen  (TYLENOL ) 650 MG CR tablet Take 1,300 mg by mouth every 8 (eight) hours as needed for pain.    [provider]  Biotin w/ Vitamins C & E (HAIR SKIN & NAILS GUMMIES) 1250-7.5-7.5 MCG-MG-UNT CHEW Chew 2 tablets by mouth daily.    [provider]  gabapentin  (NEURONTIN ) 100 MG capsule Take 100 mg by mouth daily.    [provider]  hydrochlorothiazide  (MICROZIDE ) 12.5 MG capsule Take 12.5 mg by mouth daily.    [provider]  ibuprofen  (ADVIL ) 800 MG tablet Take 1 tablet (800 mg total) by mouth 3 (three) times  daily with meals. 05/28/23   Poggi, John J, MD  ketoconazole (NIZORAL) 2 % cream Apply 1 Application topically daily as needed for irritation.    [provider]  lisinopril  (ZESTRIL ) 10 MG tablet Take 10 mg by mouth daily.    [provider]  Magnesium Glycinate 120 MG CAPS Take 120 mg by mouth 2 (two) times daily.    [provider]    Physical Exam: Vitals:   09/11/23 0855 09/11/23 1302  BP: (!) 141/88 138/80  Pulse: 90 88  Resp: 19 16  Temp: 98.6 F (37  C) 98 F (36.7 C)  TempSrc: Oral Oral  SpO2: 95% 96%  Weight: 115.2 kg   Height: 5' (1.524 m)     Physical Exam   Constitutional: Alert, awake, calm, comfortable HEENT: Neck supple Respiratory: Clear to auscultation B/L, no wheezing, no rales.  Cardiovascular: Regular rate and rhythm, no murmurs / rubs / gallops. No extremity edema. 2+ pedal pulses. No carotid bruits.  Abdomen: Soft, no tenderness, Bowel sounds positive.  Morbid obese abdomen Musculoskeletal: no clubbing / cyanosis. Good ROM, no contractures. Normal muscle tone.  Skin: no rashes, lesions, ulcers. Neurologic: CN 2-12 grossly intact. Sensation intact, No focal deficit identified Psychiatric: Alert and oriented x 3. Normal mood.    Labs on Admission: I have personally reviewed following labs and imaging studies  CBC: Recent Labs  Lab 09/11/23 0858  WBC 9.8  HGB 14.0  HCT 42.7  MCV 86.1  PLT 274   Basic Metabolic Panel: Recent Labs  Lab 09/11/23 0858  NA 140  K 3.9  CL 105  CO2 23  GLUCOSE 131*  BUN 13  CREATININE 0.65  CALCIUM 9.4   GFR: Estimated Creatinine Clearance: 94.2 mL/min (by C-G formula based on SCr of 0.65 mg/dL). Liver Function Tests: Recent Labs  Lab 09/11/23 0858  AST 61*  ALT 46*  ALKPHOS 136*  BILITOT 1.1  PROT 7.9  ALBUMIN  4.1   Recent Labs  Lab 09/11/23 0858  LIPASE 364*   No results for input(s): AMMONIA in the last 168 hours. Coagulation Profile: No results for input(s): INR, PROTIME in the last 168 hours. Cardiac Enzymes: No results for input(s): CKTOTAL, CKMB, CKMBINDEX, TROPONINI, TROPONINIHS in the last 168 hours. BNP (last 3 results) No results for input(s): BNP in the last 8760 hours. HbA1C: No results for input(s): HGBA1C in the last 72 hours. CBG: No results for input(s): GLUCAP in the last 168 hours. Lipid Profile: Recent Labs    09/11/23 0859  TRIG 119   Thyroid Function Tests: No results for input(s): TSH, T4TOTAL,  FREET4, T3FREE, THYROIDAB in the last 72 hours. Anemia Panel: No results for input(s): VITAMINB12, FOLATE, FERRITIN, TIBC, IRON, RETICCTPCT in the last 72 hours. Urine analysis:    Component Value Date/Time   COLORURINE Amber 08/20/2012 1918   APPEARANCEUR Clear 08/20/2012 1918   LABSPEC 1.023 08/20/2012 1918   PHURINE 9.0 08/20/2012 1918   GLUCOSEU Negative 08/20/2012 1918   HGBUR 3+ 08/20/2012 1918   BILIRUBINUR Negative 08/20/2012 1918   KETONESUR Negative 08/20/2012 1918   PROTEINUR 100 mg/dL 92/75/7985 8081   NITRITE Negative 08/20/2012 1918   LEUKOCYTESUR Negative 08/20/2012 1918    Radiological Exams on Admission: I have personally reviewed images CT ABDOMEN PELVIS W CONTRAST Result Date: 09/11/2023 CLINICAL DATA:  Acute left-sided abdominal pain and vomiting. Status post bariatric surgery 1 month ago. EXAM: CT ABDOMEN AND PELVIS WITH CONTRAST TECHNIQUE: Multidetector CT imaging of the abdomen and pelvis  was performed using the standard protocol following bolus administration of intravenous contrast. RADIATION DOSE REDUCTION: This exam was performed according to the departmental dose-optimization program which includes automated exposure control, adjustment of the mA and/or kV according to patient size and/or use of iterative reconstruction technique. CONTRAST:  OMNIPAQUE  IOHEXOL  300 MG/ML  SOLN COMPARISON:  August 20, 2012. FINDINGS: Lower chest: No acute abnormality. Hepatobiliary: No focal liver abnormality is seen. Status post cholecystectomy. No biliary dilatation. Pancreas: Minimal inflammatory changes are seen around the body and tail suggesting acute pancreatitis. No ductal dilatation or pseudocyst formation is noted. Spleen: Normal in size without focal abnormality. Adrenals/Urinary Tract: Adrenal glands appear normal. Small bilateral renal cysts are noted. No hydronephrosis or renal obstruction is noted. Urinary bladder is unremarkable. Stomach/Bowel:  Status post gastric bypass. The appendix is unremarkable. There is no evidence of bowel obstruction or inflammation. Sigmoid diverticulosis is noted without inflammation. Vascular/Lymphatic: No significant vascular findings are present. No enlarged abdominal or pelvic lymph nodes. Reproductive: Uterus and bilateral adnexa are unremarkable. Other: No ascites or hernia is noted. Musculoskeletal: No acute or significant osseous findings. IMPRESSION: Minimal inflammatory changes are seen around the body and tail of the pancreas suggesting acute pancreatitis. No pseudocyst formation is noted. Status post gastric bypass. Sigmoid diverticulosis without inflammation. Electronically Signed   By: Lynwood Landy Raddle M.D.   On: 09/11/2023 12:45    EKG: N/A    Assessment/Plan Principal Problem:   Acute pancreatitis Active Problems:   Essential hypertension   Morbid obesity (HCC)    Assessment and Plan: 53 year old morbidly obese female with history of recent bariatric surgery in [place] in July 2025, HTN, prediabetes who came into the hospital for epigastric abdominal pain started 4 days ago.  1.  Acute pancreatitis - She does not drink but she takes hydrochlorothiazide . - This could be the culprit in the setting of morbid obesity and recent abdominal surgery. - She will be placed in observation. - Will hold off hydrochlorothiazide . - She will be n.p.o., IV fluid, pain medications - If she feels better in the morning we can gradually introduce liquid diet and advance diet as tolerated and she may be discharged home.  If she continues to have problem or getting worse then we may have to involve GI or surgical team. - Will also give her Protonix   2.  HTN - Patient takes hydrochlorothiazide  and lisinopril . - HCTZ will be discontinued due to concern for pancreatitis - Continue to monitor blood pressure  3.  GERD - Continue Protonix      DVT prophylaxis: Lovenox  Code Status: Full Code Family  Communication: None available Disposition Plan: Home Consults called: None Admission status: Observation, Med-Surg   Nena Rebel, MD [facility] 09/11/2023, 3:06 PM

## 2023-09-11 NOTE — ED Notes (Signed)
 See triage note  Presents with some abd pain with n/v

## 2023-09-12 ENCOUNTER — Ambulatory Visit: Admitting: Occupational Therapy

## 2023-09-12 DIAGNOSIS — Z79899 Other long term (current) drug therapy: Secondary | ICD-10-CM | POA: Diagnosis not present

## 2023-09-12 DIAGNOSIS — I10 Essential (primary) hypertension: Secondary | ICD-10-CM | POA: Diagnosis present

## 2023-09-12 DIAGNOSIS — Z833 Family history of diabetes mellitus: Secondary | ICD-10-CM | POA: Diagnosis not present

## 2023-09-12 DIAGNOSIS — K219 Gastro-esophageal reflux disease without esophagitis: Secondary | ICD-10-CM | POA: Diagnosis present

## 2023-09-12 DIAGNOSIS — Z9884 Bariatric surgery status: Secondary | ICD-10-CM | POA: Diagnosis not present

## 2023-09-12 DIAGNOSIS — M549 Dorsalgia, unspecified: Secondary | ICD-10-CM | POA: Diagnosis present

## 2023-09-12 DIAGNOSIS — G8929 Other chronic pain: Secondary | ICD-10-CM | POA: Diagnosis present

## 2023-09-12 DIAGNOSIS — R748 Abnormal levels of other serum enzymes: Secondary | ICD-10-CM | POA: Diagnosis present

## 2023-09-12 DIAGNOSIS — K859 Acute pancreatitis without necrosis or infection, unspecified: Secondary | ICD-10-CM | POA: Diagnosis present

## 2023-09-12 DIAGNOSIS — Z6841 Body Mass Index (BMI) 40.0 and over, adult: Secondary | ICD-10-CM | POA: Diagnosis not present

## 2023-09-12 DIAGNOSIS — R7303 Prediabetes: Secondary | ICD-10-CM | POA: Diagnosis present

## 2023-09-12 DIAGNOSIS — Z9049 Acquired absence of other specified parts of digestive tract: Secondary | ICD-10-CM | POA: Diagnosis not present

## 2023-09-12 LAB — CBC
HCT: 36.6 % (ref 36.0–46.0)
Hemoglobin: 12 g/dL (ref 12.0–15.0)
MCH: 28.5 pg (ref 26.0–34.0)
MCHC: 32.8 g/dL (ref 30.0–36.0)
MCV: 86.9 fL (ref 80.0–100.0)
Platelets: 229 K/uL (ref 150–400)
RBC: 4.21 MIL/uL (ref 3.87–5.11)
RDW: 13.3 % (ref 11.5–15.5)
WBC: 8.5 K/uL (ref 4.0–10.5)
nRBC: 0 % (ref 0.0–0.2)

## 2023-09-12 LAB — COMPREHENSIVE METABOLIC PANEL WITH GFR
ALT: 35 U/L (ref 0–44)
AST: 40 U/L (ref 15–41)
Albumin: 3.2 g/dL — ABNORMAL LOW (ref 3.5–5.0)
Alkaline Phosphatase: 128 U/L — ABNORMAL HIGH (ref 38–126)
Anion gap: 9 (ref 5–15)
BUN: 11 mg/dL (ref 6–20)
CO2: 23 mmol/L (ref 22–32)
Calcium: 8.8 mg/dL — ABNORMAL LOW (ref 8.9–10.3)
Chloride: 110 mmol/L (ref 98–111)
Creatinine, Ser: 0.41 mg/dL — ABNORMAL LOW (ref 0.44–1.00)
GFR, Estimated: >60 mL/min (ref >60)
Glucose, Bld: 79 mg/dL (ref 70–99)
Potassium: 4 mmol/L (ref 3.5–5.1)
Sodium: 142 mmol/L (ref 135–145)
Total Bilirubin: 1.2 mg/dL (ref 0.0–1.2)
Total Protein: 6.8 g/dL (ref 6.5–8.1)

## 2023-09-12 LAB — HIV ANTIBODY (ROUTINE TESTING W REFLEX): HIV Screen 4th Generation wRfx: NONREACTIVE

## 2023-09-12 LAB — LIPASE, BLOOD: Lipase: 222 U/L — ABNORMAL HIGH (ref 11–51)

## 2023-09-12 MED ORDER — PANTOPRAZOLE SODIUM 40 MG PO TBEC
40.0000 mg | DELAYED_RELEASE_TABLET | Freq: Every day | ORAL | Status: DC
  Administered 2023-09-13 – 2023-09-15 (×3): 40 mg via ORAL
  Filled 2023-09-12 (×3): qty 1

## 2023-09-12 MED ORDER — SODIUM CHLORIDE 0.9 % IV SOLN: INTRAVENOUS | Status: DC

## 2023-09-12 MED ORDER — HYDROCODONE-ACETAMINOPHEN 5-325 MG PO TABS
1.0000 | ORAL_TABLET | ORAL | Status: DC | PRN
  Administered 2023-09-12 – 2023-09-14 (×3): 2 via ORAL
  Filled 2023-09-12 (×5): qty 2

## 2023-09-12 NOTE — Progress Notes (Signed)
  PROGRESS NOTE    Makira Holleman  FMW:[id number] DOB: [birth year] DOA: 09/11/2023 PCP: Sadie Manna, MD  [id number]  LOS: 0 days   Brief hospital course:   Assessment & Plan: Taylor Valencia is a pleasant 53 y.o. female with medical history significant for morbid obesity s/p gastric bypass a month ago in [place], HTN, prediabetes, chronic back pain, arthritis who presented to ED at [facility] for epigastric abdominal pain that started 4 days ago.    1.  Acute pancreatitis - She does not drink but she takes hydrochlorothiazide . - This could be the culprit in the setting of morbid obesity and recent abdominal surgery. --clear liquid diet --cont MIVF   2.  HTN - hold hydrochlorothiazide  and Lisinopril    3.  GERD --switch from IV to oral PPI  Morbid obesity, BMI 49.6   DVT prophylaxis: Lovenox  SQ Code Status: Full code  Family Communication:  Level of care: Med-Surg Dispo:   The patient is from: home Anticipated d/c is to: home Anticipated d/c date is: 1-2 days   Subjective and Interval History:  Pt reported abdominal pain improved.   Objective: Vitals:   09/11/23 1627 09/11/23 2031 09/12/23 0901 09/12/23 1522  BP: 124/68 120/63 (!) 150/71 124/65  Pulse: 73 72 79 74  Resp: 16 16 16 16   Temp: 98.9 F (37.2 C) 98.1 F (36.7 C) 98.2 F (36.8 C) 97.9 F (36.6 C)  TempSrc:  Oral    SpO2: 95% 98% 98% 98%  Weight:      Height:       No intake or output data in the 24 hours ending 09/12/23 1841 Filed Weights   09/11/23 0855  Weight: 115.2 kg    Examination:   Constitutional: NAD, AAOx3 HEENT: conjunctivae and lids normal, EOMI CV: No cyanosis.   RESP: normal respiratory effort, on RA Neuro: II - XII grossly intact.   Psych: Normal mood and affect.  Appropriate judgement and reason   Data Reviewed: I have personally reviewed labs and imaging studies  Time spent: 35 minutes  Ellouise Haber, MD [facility] If 7PM-7AM, please contact  night-coverage 09/12/2023, 6:41 PM

## 2023-09-12 NOTE — Progress Notes (Signed)
 Clear liquid tray ordered for patient. No PRN medications needed this shift so far. Pt resting comfortably in bed.

## 2023-09-12 NOTE — Plan of Care (Signed)

## 2023-09-13 DIAGNOSIS — K859 Acute pancreatitis without necrosis or infection, unspecified: Secondary | ICD-10-CM | POA: Diagnosis not present

## 2023-09-13 NOTE — Progress Notes (Signed)
  PROGRESS NOTE    Taylor Valencia  FMW:[id number] DOB: 28-Dec-1970 DOA: 09/11/2023 PCP: Sadie Manna, MD  [id number]  LOS: 1 day   Brief hospital course:   Assessment & Plan: Taylor Valencia is a pleasant 53 y.o. female with medical history significant for morbid obesity s/p gastric bypass a month ago in [place], HTN, prediabetes, chronic back pain, arthritis who presented to ED at [facility] for epigastric abdominal pain that started 4 days ago.    1.  Acute pancreatitis - She does not drink but she takes hydrochlorothiazide . - This could be the culprit in the setting of morbid obesity and recent abdominal surgery. --clear liquid diet --advance to low-fat diet as tolerated --cont MIVF   2.  HTN - hold hydrochlorothiazide  and Lisinopril    3.  GERD --cont PPI  Morbid obesity, BMI 49.6   DVT prophylaxis: Lovenox  SQ Code Status: Full code  Family Communication:  Level of care: Med-Surg Dispo:   The patient is from: home Anticipated d/c is to: home Anticipated d/c date is: 1-2 days   Subjective and Interval History:  Pt had vomiting and pain after clear liquid dinner last night.   Objective: Vitals:   09/12/23 1934 09/13/23 0340 09/13/23 0822 09/13/23 1609  BP: (!) 141/79 123/72 131/66 124/69  Pulse: 76 68 71 74  Resp: 18 16 18 17   Temp: 99.2 F (37.3 C) 98.5 F (36.9 C) 98.2 F (36.8 C) 98.3 F (36.8 C)  TempSrc: Oral Oral Oral   SpO2: 97% 98% 97% 100%  Weight:      Height:        Intake/Output Summary (Last 24 hours) at 09/13/2023 1627 Last data filed at 09/13/2023 1300 Gross per 24 hour  Intake 840 ml  Output --  Net 840 ml   Filed Weights   09/11/23 0855  Weight: 115.2 kg    Examination:   Constitutional: NAD, AAOx3 HEENT: conjunctivae and lids normal, EOMI CV: No cyanosis.   RESP: normal respiratory effort, on RA Neuro: II - XII grossly intact.   Psych: Normal mood and affect.  Appropriate judgement and reason   Data  Reviewed: I have personally reviewed labs and imaging studies  Time spent: 35 minutes  Ellouise Haber, MD [facility] If 7PM-7AM, please contact night-coverage 09/13/2023, 4:27 PM

## 2023-09-14 DIAGNOSIS — K859 Acute pancreatitis without necrosis or infection, unspecified: Secondary | ICD-10-CM | POA: Diagnosis not present

## 2023-09-14 NOTE — Plan of Care (Signed)

## 2023-09-14 NOTE — Progress Notes (Signed)
  PROGRESS NOTE    Taylor Valencia  FMW:[id number] DOB: 1970/08/28 DOA: 09/11/2023 PCP: Sadie Manna, MD  [id number]  LOS: 2 days   Brief hospital course:   Assessment & Plan: Taylor Valencia is a pleasant 53 y.o. female with medical history significant for morbid obesity s/p gastric bypass a month ago in [place], HTN, prediabetes, chronic back pain, arthritis who presented to ED at [facility] for epigastric abdominal pain that started 4 days ago.    1.  Acute pancreatitis - She does not drink but she takes hydrochlorothiazide . - This could be the culprit in the setting of morbid obesity and recent abdominal surgery. --advance to low-fat diet today --cont MIVF   2.  HTN - Hold hydrochlorothiazide  and Lisinopril    3.  GERD --cont PPI  Morbid obesity, BMI 49.6   DVT prophylaxis: Lovenox  SQ Code Status: Full code  Family Communication:  Level of care: Med-Surg Dispo:   The patient is from: home Anticipated d/c is to: home Anticipated d/c date is: tomorrow   Subjective and Interval History:  Pt reported abdominal pain that may be due to hunger.  No more vomiting.   Objective: Vitals:   09/13/23 1940 09/14/23 0417 09/14/23 0933 09/14/23 1430  BP: 129/77 132/67 135/80 (!) 143/80  Pulse:  65 69 69  Resp: 16 16 16 18   Temp: 97.9 F (36.6 C) 98.1 F (36.7 C) 98.7 F (37.1 C) 98.2 F (36.8 C)  TempSrc: Oral     SpO2: 99% 100% 98% 96%  Weight:      Height:        Intake/Output Summary (Last 24 hours) at 09/14/2023 1700 Last data filed at 09/14/2023 0900 Gross per 24 hour  Intake 360 ml  Output --  Net 360 ml   Filed Weights   09/11/23 0855  Weight: 115.2 kg    Examination:   Constitutional: NAD, AAOx3 HEENT: conjunctivae and lids normal, EOMI CV: No cyanosis.   RESP: normal respiratory effort, on RA Neuro: II - XII grossly intact.   Psych: Normal mood and affect.  Appropriate judgement and reason   Data Reviewed: I have personally  reviewed labs and imaging studies  Time spent: 35 minutes  Ellouise Haber, MD [facility] If 7PM-7AM, please contact night-coverage 09/14/2023, 5:00 PM

## 2023-09-15 DIAGNOSIS — K859 Acute pancreatitis without necrosis or infection, unspecified: Secondary | ICD-10-CM | POA: Diagnosis not present

## 2023-09-15 MED ORDER — LISINOPRIL 10 MG PO TABS
10.0000 mg | ORAL_TABLET | Freq: Every day | ORAL | Status: DC
Start: 2023-09-15 — End: 2023-09-15
  Administered 2023-09-15: 10 mg via ORAL
  Filled 2023-09-15: qty 1

## 2023-09-15 MED ORDER — ONDANSETRON HCL 4 MG PO TABS
4.0000 mg | ORAL_TABLET | Freq: Four times a day (QID) | ORAL | 0 refills | Status: AC | PRN
Start: 2023-09-15 — End: ?

## 2023-09-15 MED ORDER — IBUPROFEN 800 MG PO TABS: 800.0000 mg | ORAL_TABLET | Freq: Three times a day (TID) | ORAL | 0 refills | Status: AC | PRN

## 2023-09-15 MED ORDER — HYDROCHLOROTHIAZIDE 12.5 MG PO TABS
12.5000 mg | ORAL_TABLET | Freq: Every day | ORAL | Status: DC
  Administered 2023-09-15: 12.5 mg via ORAL
  Filled 2023-09-15: qty 1

## 2023-09-15 NOTE — Discharge Summary (Signed)
 Physician Discharge Summary   Taylor Valencia  female DOB: 1970/04/05  FMW:[id number]  PCP: Sadie Manna, MD  Admit date: 09/11/2023 Discharge date: 09/15/2023  Admitted From: home Disposition:  home CODE STATUS: Full code  Discharge Instructions     Discharge diet:   Complete by: As directed    Low fat diet.      Hospital Course:  For full details, please see H&P, progress notes, consult notes and ancillary notes.  Briefly,  Taylor Valencia is a pleasant 53 y.o. female with medical history significant for morbid obesity s/p gastric bypass a month ago in [place], HTN, prediabetes, chronic back pain who presented to ED at [facility] for epigastric abdominal pain that started 4 days ago.    Acute pancreatitis - She does not drink.  Pt is s/p cholecystectomy.  Triglycerides level wnl. --pt received MIVF and tolerated low-fat diet prior to discharge.   HTN - resume home hydrochlorothiazide  and Lisinopril  after discharge.   GERD --cont PPI   Morbid obesity, BMI 49.6   Unless noted above, medications under STOP list are ones pt was not taking PTA.  Discharge Diagnoses:  Principal Problem:   Acute pancreatitis Active Problems:   Essential hypertension   Morbid obesity (HCC)   30 Day Unplanned Readmission Risk Score    Flowsheet Row ED to Hosp-Admission (Current) from 09/11/2023 in [facility]  30 Day Unplanned Readmission Risk Score (%) 5.94 Filed at 09/15/2023 0801    This score is the patient's risk of an unplanned readmission within 30 days of being discharged (0 -100%). The score is based on dignosis, age, lab data, medications, orders, and past utilization.   Low:  0-14.9   Medium: 15-21.9   High: 22-29.9   Extreme: 30 and above         Discharge Instructions:  Allergies as of 09/15/2023   No Known Allergies      Medication List     STOP taking these medications    gabapentin  100 MG  capsule Commonly known as: NEURONTIN    ketoconazole 2 % cream Commonly known as: NIZORAL       TAKE these medications    acetaminophen  650 MG CR tablet Commonly known as: TYLENOL  Take 1,300 mg by mouth every 8 (eight) hours as needed for pain.   Hair Skin & Nails Gummies 1250-7.5-7.5 MCG-MG-UNT Chew Generic drug: Biotin w/ Vitamins C & E Chew 2 tablets by mouth daily.   hydrochlorothiazide  12.5 MG capsule Commonly known as: MICROZIDE  Take 12.5 mg by mouth daily.   ibuprofen  800 MG tablet Commonly known as: ADVIL  Take 1 tablet (800 mg total) by mouth every 8 (eight) hours as needed. Home med. What changed:  when to take this reasons to take this additional instructions   lisinopril  10 MG tablet Commonly known as: ZESTRIL  Take 10 mg by mouth daily.   Magnesium Glycinate 120 MG Caps Take 120 mg by mouth 2 (two) times daily.   omeprazole 20 MG tablet Commonly known as: PRILOSEC OTC Take 20 mg by mouth daily.   ondansetron  4 MG tablet Commonly known as: ZOFRAN  Take 1 tablet (4 mg total) by mouth every 6 (six) hours as needed for nausea.         Follow-up Information     Sadie Manna, MD Follow up in 1 week(s).   Specialty: Internal Medicine Contact information: [address] [place] [address] [phone]  No Known Allergies   The results of significant diagnostics from this hospitalization (including imaging, microbiology, ancillary and laboratory) are listed below for reference.   Consultations:   Procedures/Studies: CT ABDOMEN PELVIS W CONTRAST Result Date: 09/11/2023 CLINICAL DATA:  Acute left-sided abdominal pain and vomiting. Status post bariatric surgery 1 month ago. EXAM: CT ABDOMEN AND PELVIS WITH CONTRAST TECHNIQUE: Multidetector CT imaging of the abdomen and pelvis was performed using the standard protocol following bolus administration of intravenous contrast. RADIATION DOSE  REDUCTION: This exam was performed according to the departmental dose-optimization program which includes automated exposure control, adjustment of the mA and/or kV according to patient size and/or use of iterative reconstruction technique. CONTRAST:  OMNIPAQUE  IOHEXOL  300 MG/ML  SOLN COMPARISON:  August 20, 2012. FINDINGS: Lower chest: No acute abnormality. Hepatobiliary: No focal liver abnormality is seen. Status post cholecystectomy. No biliary dilatation. Pancreas: Minimal inflammatory changes are seen around the body and tail suggesting acute pancreatitis. No ductal dilatation or pseudocyst formation is noted. Spleen: Normal in size without focal abnormality. Adrenals/Urinary Tract: Adrenal glands appear normal. Small bilateral renal cysts are noted. No hydronephrosis or renal obstruction is noted. Urinary bladder is unremarkable. Stomach/Bowel: Status post gastric bypass. The appendix is unremarkable. There is no evidence of bowel obstruction or inflammation. Sigmoid diverticulosis is noted without inflammation. Vascular/Lymphatic: No significant vascular findings are present. No enlarged abdominal or pelvic lymph nodes. Reproductive: Uterus and bilateral adnexa are unremarkable. Other: No ascites or hernia is noted. Musculoskeletal: No acute or significant osseous findings. IMPRESSION: Minimal inflammatory changes are seen around the body and tail of the pancreas suggesting acute pancreatitis. No pseudocyst formation is noted. Status post gastric bypass. Sigmoid diverticulosis without inflammation. Electronically Signed   By: Lynwood Landy Raddle M.D.   On: 09/11/2023 12:45      Labs: BNP (last 3 results) No results for input(s): BNP in the last 8760 hours. Basic Metabolic Panel: Recent Labs  Lab 09/11/23 0858 09/12/23 0521  NA 140 142  K 3.9 4.0  CL 105 110  CO2 23 23  GLUCOSE 131* 79  BUN 13 11  CREATININE 0.65 0.41*  CALCIUM 9.4 8.8*   Liver Function Tests: Recent Labs  Lab  09/11/23 0858 09/12/23 0521  AST 61* 40  ALT 46* 35  ALKPHOS 136* 128*  BILITOT 1.1 1.2  PROT 7.9 6.8  ALBUMIN  4.1 3.2*   Recent Labs  Lab 09/11/23 0858 09/12/23 0521  LIPASE 364* 222*   No results for input(s): AMMONIA in the last 168 hours. CBC: Recent Labs  Lab 09/11/23 0858 09/12/23 0521  WBC 9.8 8.5  HGB 14.0 12.0  HCT 42.7 36.6  MCV 86.1 86.9  PLT 274 229   Cardiac Enzymes: No results for input(s): CKTOTAL, CKMB, CKMBINDEX, TROPONINI in the last 168 hours. BNP: Invalid input(s): POCBNP CBG: No results for input(s): GLUCAP in the last 168 hours. D-Dimer No results for input(s): DDIMER in the last 72 hours. Hgb A1c No results for input(s): HGBA1C in the last 72 hours. Lipid Profile No results for input(s): CHOL, HDL, LDLCALC, TRIG, CHOLHDL, LDLDIRECT in the last 72 hours. Thyroid function studies No results for input(s): TSH, T4TOTAL, T3FREE, THYROIDAB in the last 72 hours.  Invalid input(s): FREET3 Anemia work up No results for input(s): VITAMINB12, FOLATE, FERRITIN, TIBC, IRON, RETICCTPCT in the last 72 hours. Urinalysis    Component Value Date/Time   COLORURINE YELLOW (A) 09/11/2023 1530   APPEARANCEUR CLEAR (A) 09/11/2023 1530   APPEARANCEUR Clear 08/20/2012 1918  LABSPEC >1.046 (H) 09/11/2023 1530   LABSPEC 1.023 08/20/2012 1918   PHURINE 6.0 09/11/2023 1530   GLUCOSEU NEGATIVE 09/11/2023 1530   GLUCOSEU Negative 08/20/2012 1918   HGBUR NEGATIVE 09/11/2023 1530   BILIRUBINUR NEGATIVE 09/11/2023 1530   BILIRUBINUR Negative 08/20/2012 1918   KETONESUR 5 (A) 09/11/2023 1530   PROTEINUR NEGATIVE 09/11/2023 1530   NITRITE NEGATIVE 09/11/2023 1530   LEUKOCYTESUR MODERATE (A) 09/11/2023 1530   LEUKOCYTESUR Negative 08/20/2012 1918   Sepsis Labs Recent Labs  Lab 09/11/23 0858 09/12/23 0521  WBC 9.8 8.5   Microbiology No results found for this or any previous visit (from the past 240  hours).   Total time spend on discharging this patient, including the last patient exam, discussing the hospital stay, instructions for ongoing care as it relates to all pertinent caregivers, as well as preparing the medical discharge records, prescriptions, and/or referrals as applicable, is 30 minutes.    Ellouise Haber, MD  [facility] 09/15/2023, 10:21 AM

## 2023-09-15 NOTE — Progress Notes (Signed)
 Taylor Valencia to be D/C'd Home per MD order.  Discussed with the patient and all questions fully answered.  VSS, Skin clean, dry and intact without evidence of skin break down, no evidence of skin tears noted. IV catheters discontinued intact. Site without signs and symptoms of complications. Dressing and pressure applied.  An After Visit Summary was printed and given to the patient. Patient received prescription.  D/c education completed with patient/family including follow up instructions, medication list, d/c activities limitations if indicated, with other d/c instructions as indicated by MD - patient able to verbalize understanding, all questions fully answered.   Patient instructed to return to ED, call 911, or call MD for any changes in condition.   Patient escorted via WC, and D/C home via private auto.  [name] 09/15/2023 10:43 AM

## 2023-09-22 ENCOUNTER — Ambulatory Visit: Admitting: Occupational Therapy

## 2023-09-26 ENCOUNTER — Ambulatory Visit: Admitting: Occupational Therapy

## 2023-10-28 ENCOUNTER — Ambulatory Visit: Attending: Student | Admitting: Occupational Therapy

## 2023-10-28 DIAGNOSIS — M79641 Pain in right hand: Secondary | ICD-10-CM | POA: Diagnosis present

## 2023-10-28 DIAGNOSIS — L905 Scar conditions and fibrosis of skin: Secondary | ICD-10-CM | POA: Insufficient documentation

## 2023-10-28 DIAGNOSIS — M25641 Stiffness of right hand, not elsewhere classified: Secondary | ICD-10-CM | POA: Diagnosis present

## 2023-10-28 DIAGNOSIS — M6281 Muscle weakness (generalized): Secondary | ICD-10-CM | POA: Diagnosis present

## 2023-10-28 DIAGNOSIS — M25631 Stiffness of right wrist, not elsewhere classified: Secondary | ICD-10-CM | POA: Insufficient documentation

## 2023-10-28 NOTE — Therapy (Signed)
 OUTPATIENT OCCUPATIONAL THERAPY ORTHO TREATMENT/RECERT   Patient Name: Taylor Valencia MRN: [id number] DOB:1970/11/30, 53 y.o., female Today's Date: 10/28/2023  PCP: Dr Sadie MART PROVIDER: Kip PA  END OF SESSION:  OT End of Session - 10/28/23 1749     Visit Number 23    Number of Visits 26    Date for Recertification  11/25/23    OT Start Time 1401    OT Stop Time 1447    OT Time Calculation (min) 46 min    Activity Tolerance Patient tolerated treatment well    Behavior During Therapy WFL for tasks assessed/performed            Past Medical History:  Diagnosis Date   Arthritis    Chronic back pain    History of bronchitis    Hypertension    Pre-diabetes    Past Surgical History:  Procedure Laterality Date   BOWEL RESECTION     patient had some muscle removed    CARPAL TUNNEL RELEASE Right 05/28/2023   Procedure: RELEASE, CARPAL TUNNEL, ENDOSCOPIC;  Surgeon: Edie Norleen PARAS, MD;  Location: [facility];  Service: Orthopedics;  Laterality: Right;   CHOLECYSTECTOMY     COLONOSCOPY WITH PROPOFOL  N/A 03/11/2022   Procedure: COLONOSCOPY WITH PROPOFOL ;  Surgeon: Maryruth Ole DASEN, MD;  Location: [facility];  Service: Endoscopy;  Laterality: N/A;   EYE SURGERY     lasix surgery   LUMBAR LAMINECTOMY/DECOMPRESSION MICRODISCECTOMY N/A 08/24/2015   Procedure: Thoracic nine- ten, Thoracic ten- eleven Laminectomy;  Surgeon: Reyes Budge, MD;  Location: [facility];  Service: Neurosurgery;  Laterality: N/A;  T9-10, T10-11 Laminectomy   TONSILLECTOMY     TRIGGER FINGER RELEASE Right 05/28/2023   Procedure: RELEASE, A1 PULLEY, FOR TRIGGER FINGER;  Surgeon: Edie Norleen PARAS, MD;  Location: [facility];  Service: Orthopedics;  Laterality: Right;   Patient Active Problem List   Diagnosis Date Noted   Acute pancreatitis 09/11/2023   Essential hypertension 12/22/2018   Morbid obesity (HCC) 12/22/2018   Depression 12/22/2018   Thoracic myelopathy 08/24/2015     ONSET DATE: 05/28/23  REFERRING DIAG: R CTS and 3rd trigger finger release  THERAPY DIAG:  Stiffness of right hand, not elsewhere classified  Pain in right hand  Muscle weakness (generalized)  Stiffness of right wrist, not elsewhere classified  Scar condition and fibrosis of skin  Rationale for Evaluation and Treatment: Rehabilitation  SUBJECTIVE:   SUBJECTIVE STATEMENT: Since have seen you a month ago 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  By middle finger joint continues to hurt when you make a fist.  At nighttime and when I use it at work.  I have been back to work about 6 hours a day 4 days in a row.  And then 4 days off-I do wear a wraparound my wrist just for prevention to decrease pain.  In the silicone little wrap around my middle joint Pt accompanied by: self  PERTINENT HISTORY: Impression: Carpal tunnel syndrome, right [G56.01] Carpal tunnel syndrome, right (primary encounter diagnosis) Trigger middle finger of right hand  Plan:  1. Treatment options were discussed today with the patient. 2. Sutures removed at today's visit, benzoin and Steri-Strips were applied. The patient was instructed that she may begin the shower at this time. 3. She was given hand exercises to perform at home at this time. She was instructed to begin massaging the incision sites later this week to decrease chances of scar tissue development. 4. The patient  was instructed to continue to wear the Velcro wrist splint for 1 more week at night but she does not need to wear it during today. Gradually increase activities as tolerated to the right hand. 5. Given her limitations with flexion at today's visit, a referral for formal occupational therapy was placed to the patient. A work note was provided keeping the patient out until her next follow-up appointment. 6. The patient will follow-up in 1 month with Dr. Edie for repeat evaluation of the right hand. They can call the clinic they  have any questions, new symptoms develop or symptoms worsen.   PRECAUTIONS: None  WEIGHT BEARING RESTRICTIONS: No  PAIN:  Are you having pain?  Right third digit PIP with a tight fist pain, 6/10; 6/10 pain with resistance for ulnar deviation at FCU as well as volar wrist pain with supination  FALLS: Has patient fallen in last 6 months? 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  In her free time doing things around the house  PATIENT GOALS: Get the scar better as well as her motion and strength to be able to do her job and things around the house  NEXT MD VISIT: ?  OBJECTIVE:  Note: Objective measures were completed at Evaluation unless otherwise noted.  HAND DOMINANCE: Right  ADLs: Patient limited in use of right dominant hand because of incision not closed and guarding as well as increased edema and decreased motion and increased pain   FUNCTIONAL OUTCOME MEASURES: Next session   UPPER EXTREMITY ROM:     Active ROM Right eval Left eval R 06/30/23 R 07/24/23 R 08/15/23 R 10/28/23  Shoulder flexion        Shoulder abduction        Shoulder adduction        Shoulder extension        Shoulder internal rotation        Shoulder external rotation        Elbow flexion        Elbow extension        Wrist flexion 40  42 65 70 80 80  Wrist extension 70 digits bend 66, digits slightly flexed 65 70 70 65  Wrist ulnar deviation        Wrist radial deviation      Pain at FCU 6/10  Wrist pronation        Wrist supination      Pain with resistance volar wrist  (Blank rows = not tested)  Active ROM Right eval Left eval R 06/25/23 R  06/30/23 R 07/08/23 R 07/10/23 R 07/24/23 R 08/14/23 R 10/28/23  Thumb MCP (0-60)           Thumb IP (0-80)           Thumb Radial abd/add (0-55) 42    55       Thumb Palmar abd/add (0-45) 50    50       Thumb Opposition to Small Finger            Index MCP  (0-90) 50   60 65 60 65 60 70 60  Index PIP (0-100) 70   85 95 95  90 90 95  Index DIP (0-70)             Long MCP (0-90) 45    55 60 60 70 60 75 80  Long PIP (  0-100)  90   80 80 95  95 95 100  Long DIP (0-70)             Ring MCP (0-90) 60    60 60 60 70 65 80 80  Ring PIP (0-100) 80    80 95 100  100 100 100  Ring DIP (0-70)             Little MCP (0-90) 60    55 65 60 70 70 80 90  Little PIP (0-100) 80    80 85 95  100 95 100  Little DIP (0-70)             (Blank rows = not tested)   HAND FUNCTION:  Grip strength: Right: 10 lbs; Left: 35 lbs, Lateral pinch: Right: 8 lbs, Left: 12 lbs, and 3 point pinch: Right: 4 lbs, Left: 12 lbs 07/14/23 Grip strength: Right: 12lbs; Left: 35 lbs, Lateral pinch: Right: 8 lbs, Left: 12 lbs, and 3 point pinch: Right: 5 lbs, Left: 12 lbs 07/24/23 Grip strength: Right: 16lbs; Left: 35 lbs, Lateral pinch: Right: 8 lbs, Left: 12 lbs, and 3 point pinch: Right: 5 lbs, Left: 12 lbs 08/12/2023 Grip right 18#, lateral pinch 11#, 3 point pinch 7# 08/15/23 Grip strength: Right: 20lbs; Left: 35 lbs, Lateral pinch: Right: 12 lbs, Left: 12 lbs, and 3 point pinch: Right: 7 lbs, Left: 12 lbs 08/18/23 Grip strength: Right: 22lbs; Left: 35 lbs, Lateral pinch: Right: 12 lbs, Left: 12 lbs, and 3 point pinch: Right: 9 lbs, Left: 12 lbs 08/25/23 Grip strength: Right: 26 lbs; Left: 35 lbs, Lateral pinch: Right: 14 lbs, Left: 12 lbs, and 3 point pinch: Right: 10 lbs, Left: 12 lbs 08/28/23 Grip strength: Right: 27 lbs; Left: 35 lbs, Lateral pinch: Right: 14 lbs, Left: 12 lbs, and 3 point pinch: Right: 10 lbs, Left: 12 lbs 09/02/23 Grip strength: Right: 27 lbs; Left: 45 lbs, Lateral pinch: Right: 14 lbs, Left: 12 lbs, and 3 point pinch: Right: 10 lbs, Left: 12 lbs 10/28/23 Grip strength: Right: 35 lbs; Left: 40 lbs, Lateral pinch: Right: 14 lbs, Left: 13 lbs, and 3 point pinch: Right: 12 lbs, Left: 12 lbs COORDINATION: Impaired because of increased swelling and pain in  stiffness  SENSATION: Numbness in 2nd, 3rd and 4th digits with third throwers  EDEMA: edema on the right hand  COGNITION: Overall cognitive status: Within functional limits for tasks assessed  TREATMENT DATE:10/28/23   Patient has not been seen for about 4 weeks.  Patient was hospitalized and then seen orthopedics. Patient returned to work 6 hours a day for 4 days on and then 4 days off Patient reports discomfort and pain still with composite flexion at third PIP with functional use as well as the end of the day and nighttime. Continues to have stiffness in the morning in her digits but after taking a shower in a bath to get better This day patient had pain with resistance to ulnar deviation at FCU As well as resistance with supination pain at volar wrist 6/10 pain Patient is tender over the FCU as well as the pisiform he   Patient report issues with hip pain back pain as well as in the feet. Since gastric bypass surgery patient lost about 30 pounds or more. Patient with increased weakness and upper body and over all strength Patient had carpal tunnel symptoms and issues for months and favoring hands. Patient did report Dr. Rawleigh states that that she can write  her a physical therapy order to evaluate and treat  Discussed with patient the importance of upper body and core strength the importance of it to not overuse her over grip with her hands and forearms. Would recommend for her to look into physical therapy  Paraffin bath done 8 minutes to right hand to decrease pain and stiffness in joints  After paraffin patient had no pain in PIP with composite flexion as well as less pain in wrist.  Supination was pain-free but patient continued to have some discomfort over the FCU with resistance to ulnar deviation and tenderness with palpation   Recommended for patient to look into a paraffin bath to use in the mornings in the evenings to decrease pain at the PIP and assist with  composite flexion As well as working on passive range of motion for composite wrist flexion in the mornings for evenings in the shower Patient to continue with a wrist wrap at work to decrease pain at the volar wrist and FCU As well as silicone sleeve for PIP at work.    PATIENT EDUCATION: Education details: findings of eval and HEP  Person educated: Patient Education method: Explanation, Demonstration, Tactile cues, Verbal cues, and Handouts Education comprehension: verbalized understanding, returned demonstration, verbal cues required, and needs further education    GOALS: Goals reviewed with patient? Yes   LONG TERM GOALS: Target date: 8 wks  Patient be independent in home program to decrease edema and pain for patient to increase digit flexion to touching palm. Baseline: Incision not closed.  Increased edema and pain increased to 8-10/10 in the third digit; MC flexion 45 to 60 degrees and PIP 70-90 Goal status: Met  2.  Wrist flexion extension on the right improved to within normal limits for patient to push and pull heavy door as well as turn doorknob without increased symptoms Baseline: Patient continues to be limited in wrist flexion with pain over FCU with pulling as well as discomfort with pushing boxes at work  goal status: Progressing  3.  Incisional right hand improved for patient to initiate scar massage as well as being able to tolerate different textures including tapping and clapping hands with no increase symptoms Baseline: Patient report incision not closed since last time.  Steri-Strips came off and incision opening with digit extension.  Steri-Strips applied if not staying in place patient to contact orthopedics in the next 24 to 48 hours.  Keep home exercises without opening or putting stress on incision Goal status: Met  4.  Right grip and prehension strength improve to more than 75% compared to the left for patient to return to using hand normally at home and  cooking and laundry and cleaning activities without increase symptoms Baseline: Grip and prehension strength improved to within normal range with the left but continues to have some increased symptoms in the third PIP as well as over FCU.   Goal status: Progressing   ASSESSMENT:  CLINICAL IMPRESSION: Patient seen for occupational therapy for right arthroscopic carpal tunnel release and third digit trigger finger release by Dr. Edie -patient had surgery 05/28/2023.  Patient was hospitalized about 4 to 6 weeks ago.  Was not seen since then.  Patient her orthopedic follow-up in return to work 6 hours a day 4 days on 4 days off.  Patient continues to be limited in pain at the third PIP with composite flexion as well as volar wrist with supination as well as pushing boxes.  Increased pain over FCU with resistance  to ulnar deviation.  Patient also with tenderness over the FCU and pisiform 8.  After moist heat/paraffin patient had less pain with supination and PIP of third digit flexion.  Discussed with patient the importance of looking into a paraffin bath to use twice a day.  As well as physical therapy for strengthening and conditioning upper body thoracic and lower back and hips.  Patient report has back pain hip pain and has been favoring her hands for a while.  Patient to continue to use wrist wrap at work as well as on PIP.  Patient limited in functional use of right dominant hand in ADLs and IADLs.  Patient can benefit from skilled OT services to decrease pain, scar tissue and increased motion and strength to return to prior level of function  PERFORMANCE DEFICITS: in functional skills including ADLs, IADLs, ROM, strength, pain, flexibility, decreased knowledge of use of DME, and UE functional use,   and psychosocial skills including environmental adaptation and routines and behaviors.   IMPAIRMENTS: are limiting patient from ADLs, IADLs, rest and sleep, play, leisure, and social participation.    COMORBIDITIES: has no other co-morbidities that affects occupational performance. Patient will benefit from skilled OT to address above impairments and improve overall function.  MODIFICATION OR ASSISTANCE TO COMPLETE EVALUATION: No modification of tasks or assist necessary to complete an evaluation.  OT OCCUPATIONAL PROFILE AND HISTORY: Problem focused assessment: Including review of records relating to presenting problem.  CLINICAL DECISION MAKING: LOW - limited treatment options, no task modification necessary  REHAB POTENTIAL: Good for goals  EVALUATION COMPLEXITY: Low    PLAN:  OT FREQUENCY: 1  x wk for 4 wks  OT DURATION: 4 wks   PLANNED INTERVENTIONS: 97168 OT Re-evaluation, 97535 self care/ADL training, 02889 therapeutic exercise, 97530 therapeutic activity, 97140 manual therapy, 97018 paraffin, 02960 fluidotherapy, 97034 contrast bath, scar mobilization, passive range of motion, patient/family education, and DME and/or AE instructions  CONSULTED AND AGREED WITH PLAN OF CARE: Patient   Ancel Peters, OTR/L,CLT 10/28/2023, 5:51 PM

## 2023-11-03 ENCOUNTER — Ambulatory Visit: Attending: Student | Admitting: Occupational Therapy

## 2023-11-03 ENCOUNTER — Ambulatory Visit: Admitting: Occupational Therapy

## 2023-11-03 DIAGNOSIS — M6281 Muscle weakness (generalized): Secondary | ICD-10-CM | POA: Diagnosis present

## 2023-11-03 DIAGNOSIS — M25641 Stiffness of right hand, not elsewhere classified: Secondary | ICD-10-CM | POA: Insufficient documentation

## 2023-11-03 DIAGNOSIS — L905 Scar conditions and fibrosis of skin: Secondary | ICD-10-CM | POA: Diagnosis present

## 2023-11-03 DIAGNOSIS — M79641 Pain in right hand: Secondary | ICD-10-CM | POA: Insufficient documentation

## 2023-11-03 NOTE — Therapy (Signed)
 OUTPATIENT OCCUPATIONAL THERAPY ORTHO TREATMENT  Patient Name: Taylor Valencia MRN: [id number] DOB:[birth year], 53 y.o., female Today's Date: 11/03/2023  PCP: Dr Sadie MART PROVIDER: Kip PA  END OF SESSION:  OT End of Session - 11/03/23 1400     Visit Number 24    Number of Visits 26    Date for Recertification  11/25/23    OT Start Time 1356    OT Stop Time 1428    OT Time Calculation (min) 32 min    Activity Tolerance Patient tolerated treatment well    Behavior During Therapy WFL for tasks assessed/performed            Past Medical History:  Diagnosis Date   Arthritis    Chronic back pain    History of bronchitis    Hypertension    Pre-diabetes    Past Surgical History:  Procedure Laterality Date   BOWEL RESECTION     patient had some muscle removed    CARPAL TUNNEL RELEASE Right 05/28/2023   Procedure: RELEASE, CARPAL TUNNEL, ENDOSCOPIC;  Surgeon: Edie Norleen PARAS, MD;  Location: [facility] ORS;  Service: Orthopedics;  Laterality: Right;   CHOLECYSTECTOMY     COLONOSCOPY WITH PROPOFOL  N/A 03/11/2022   Procedure: COLONOSCOPY WITH PROPOFOL ;  Surgeon: Maryruth Ole DASEN, MD;  Location[facility] ENDOSCOPY;  Service: Endoscopy;  Laterality: N/A;   EYE SURGERY     lasix surgery   LUMBAR LAMINECTOMY/DECOMPRESSION MICRODISCECTOMY N/A 08/24/2015   Procedure: Thoracic nine- ten, Thoracic ten- eleven Laminectomy;  Surgeon: Reyes Budge, MD;  Location: [facility] NEURO ORS;  Service: Neurosurgery;  Laterality: N/A;  T9-10, T10-11 Laminectomy   TONSILLECTOMY     TRIGGER FINGER RELEASE Right 05/28/2023   Procedure: RELEASE, A1 PULLEY, FOR TRIGGER FINGER;  Surgeon: Edie Norleen PARAS, MD;  Location: [facility] ORS;  Service: Orthopedics;  Laterality: Right;   Patient Active Problem List   Diagnosis Date Noted   Acute pancreatitis 09/11/2023   Essential hypertension 12/22/2018   Morbid obesity (HCC) 12/22/2018   Depression 12/22/2018   Thoracic myelopathy 08/24/2015    ONSET DATE:  05/28/23  REFERRING DIAG: R CTS and 3rd trigger finger release  THERAPY DIAG:  Stiffness of right hand, not elsewhere classified  Pain in right hand  Muscle weakness (generalized)  Rationale for Evaluation and Treatment: Rehabilitation  SUBJECTIVE:   SUBJECTIVE STATEMENT: 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 Treatment options were discussed today with the patient. 2. Sutures removed at today's visit, benzoin and Steri-Strips were applied. The patient was instructed that she may begin the shower at this time. 3. She was given hand exercises to perform at home at this time. She was instructed to begin massaging the incision sites later this week to decrease chances of scar tissue development. 4. The patient was instructed to continue to wear the Velcro wrist splint for 1 more week at night but she does not need to wear it during today. Gradually increase activities as tolerated to the right hand. 5. Given her limitations with flexion at today's visit, a referral for formal occupational therapy was placed to the patient. A work note was provided keeping the patient out until her next follow-up  appointment. 6. The patient will follow-up in 1 month with Dr. Edie for repeat evaluation of the right hand. They can call the clinic they have any questions, new symptoms develop or symptoms worsen.   PRECAUTIONS: None  WEIGHT BEARING RESTRICTIONS: No  PAIN:  Are you having pain? Ulnar wrist pain cutting pineapple 5/10   FALLS: Has patient fallen in last 6 months? 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  In her free time doing things around the house  PATIENT GOALS: Get the scar better as well as her motion and strength to be able to do her job and things around the house  NEXT MD VISIT: ?  OBJECTIVE:  Note: Objective measures were completed at Evaluation unless otherwise noted.  HAND DOMINANCE: Right  ADLs: Patient limited in use of right dominant hand because of incision not closed and guarding as well as increased edema and decreased motion and increased pain   FUNCTIONAL OUTCOME MEASURES: Next session   UPPER EXTREMITY ROM:     Active ROM Right eval Left eval R 06/30/23 R 07/24/23 R 08/15/23 R 10/28/23  Shoulder flexion        Shoulder abduction        Shoulder adduction        Shoulder extension        Shoulder internal rotation        Shoulder external rotation        Elbow flexion        Elbow extension        Wrist flexion 40  42 65 70 80 80  Wrist extension 70 digits bend 66, digits slightly flexed 65 70 70 65  Wrist ulnar deviation        Wrist radial deviation      Pain at FCU 6/10  Wrist pronation        Wrist supination      Pain with resistance volar wrist  (Blank rows = not tested)  Active ROM Right eval Left eval R 06/25/23 R  06/30/23 R 07/08/23 R 07/10/23 R 07/24/23 R 08/14/23 R 10/28/23  Thumb MCP (0-60)           Thumb IP (0-80)           Thumb Radial abd/add (0-55) 42    55       Thumb Palmar abd/add (0-45) 50    50       Thumb Opposition to Small Finger            Index MCP (0-90) 50   60 65 60 65 60 70 60  Index PIP (0-100) 70   85 95 95  90 90 95  Index DIP (0-70)             Long MCP (0-90) 45    55 60 60 70 60 75 80  Long PIP (0-100)  90   80 80 95  95 95 100  Long DIP (0-70)             Ring MCP (0-90) 60    60 60 60 70 65 80 80  Ring PIP (0-100) 80    80 95 100  100 100 100  Ring DIP (0-70)             Little MCP (0-90) 60    55 65 60 70 70 80 90  Little PIP (  0-100) 80    80 85 95  100 95 100  Little DIP (0-70)              (Blank rows = not tested)   HAND FUNCTION:  Grip strength: Right: 10 lbs; Left: 35 lbs, Lateral pinch: Right: 8 lbs, Left: 12 lbs, and 3 point pinch: Right: 4 lbs, Left: 12 lbs 07/14/23 Grip strength: Right: 12lbs; Left: 35 lbs, Lateral pinch: Right: 8 lbs, Left: 12 lbs, and 3 point pinch: Right: 5 lbs, Left: 12 lbs 07/24/23 Grip strength: Right: 16lbs; Left: 35 lbs, Lateral pinch: Right: 8 lbs, Left: 12 lbs, and 3 point pinch: Right: 5 lbs, Left: 12 lbs 08/12/2023 Grip right 18#, lateral pinch 11#, 3 point pinch 7# 08/15/23 Grip strength: Right: 20lbs; Left: 35 lbs, Lateral pinch: Right: 12 lbs, Left: 12 lbs, and 3 point pinch: Right: 7 lbs, Left: 12 lbs 08/18/23 Grip strength: Right: 22lbs; Left: 35 lbs, Lateral pinch: Right: 12 lbs, Left: 12 lbs, and 3 point pinch: Right: 9 lbs, Left: 12 lbs 08/25/23 Grip strength: Right: 26 lbs; Left: 35 lbs, Lateral pinch: Right: 14 lbs, Left: 12 lbs, and 3 point pinch: Right: 10 lbs, Left: 12 lbs 08/28/23 Grip strength: Right: 27 lbs; Left: 35 lbs, Lateral pinch: Right: 14 lbs, Left: 12 lbs, and 3 point pinch: Right: 10 lbs, Left: 12 lbs 09/02/23 Grip strength: Right: 27 lbs; Left: 45 lbs, Lateral pinch: Right: 14 lbs, Left: 12 lbs, and 3 point pinch: Right: 10 lbs, Left: 12 lbs 10/28/23 Grip strength: Right: 35 lbs; Left: 40 lbs, Lateral pinch: Right: 14 lbs, Left: 13 lbs, and 3 point pinch: Right: 12 lbs, Left: 12 lbs COORDINATION: Impaired because of increased swelling and pain in stiffness  SENSATION: Numbness in 2nd, 3rd and 4th digits with third throwers  EDEMA: edema on the right hand  COGNITION: Overall cognitive status: Within functional limits for tasks assessed  TREATMENT DATE: 11/03/23   I am back to work  My fingers are stiff in the am but since back to work also sore  Not taking any medication     Paraffin bath done 8 minutes to right hand to decrease pain and stiffness in joints  After paraffin patient had no pain in PIP with composite  flexion as well as less pain in wrist.  Supination was pain-free but patient continued to have some discomfort over the FCU with resistance to ulnar deviation and tenderness with palpation  Done dry Light static  cupping 2 over volar forearm and  one  volar palm in combination with composite wrist and digit extension 10 reps Repeat 3 times Patient report also increased tightness over extensors of forearm Done also dry light static cupping 2 over dorsal forearm in combination with wrist flexion elbow to side and then extended elbow.  10 repetitions  repeat 3 times  Increase wrist flexion and extension afterwards  Recommended at recert for patient to look into a paraffin bath to use in the mornings in the evenings to decrease pain at the PIP and assist with composite flexion As well as working on passive range of motion for composite wrist flexion in the mornings for evenings in the shower Patient to continue with a wrist wrap at work to decrease pain at the volar wrist and FCU As well as silicone sleeve for PIP at work.  Patient report having some pain at the medial lateral elbows and wearing 2 straps at work.  Reviewed with patient correct donning and wearing  of medial epicondylitis counterforce strap   Patient report issues with hip pain back pain as well as in the feet. Since gastric bypass surgery patient lost about 30 pounds or more. Patient with increased weakness and upper body and over all strength Patient had carpal tunnel symptoms and issues for months and favoring hands. Patient did report Dr. Rawleigh states that that she can write her a physical therapy order to evaluate and treat  Discussed with patient the importance of upper body and core strength the importance of it to not overuse her over grip with her hands and forearms. Would recommend for her to look into physical therapy     PATIENT EDUCATION: Education details: findings of eval and HEP  Person educated:  Patient Education method: Explanation, Demonstration, Tactile cues, Verbal cues, and Handouts Education comprehension: verbalized understanding, returned demonstration, verbal cues required, and needs further education    GOALS: Goals reviewed with patient? Yes   LONG TERM GOALS: Target date: 8 wks  Patient be independent in home program to decrease edema and pain for patient to increase digit flexion to touching palm. Baseline: Incision not closed.  Increased edema and pain increased to 8-10/10 in the third digit; MC flexion 45 to 60 degrees and PIP 70-90 Goal status: Met  2.  Wrist flexion extension on the right improved to within normal limits for patient to push and pull heavy door as well as turn doorknob without increased symptoms Baseline: Patient continues to be limited in wrist flexion with pain over FCU with pulling as well as discomfort with pushing boxes at work  goal status: Progressing  3.  Incisional right hand improved for patient to initiate scar massage as well as being able to tolerate different textures including tapping and clapping hands with no increase symptoms Baseline: Patient report incision not closed since last time.  Steri-Strips came off and incision opening with digit extension.  Steri-Strips applied if not staying in place patient to contact orthopedics in the next 24 to 48 hours.  Keep home exercises without opening or putting stress on incision Goal status: Met  4.  Right grip and prehension strength improve to more than 75% compared to the left for patient to return to using hand normally at home and cooking and laundry and cleaning activities without increase symptoms Baseline: Grip and prehension strength improved to within normal range with the left but continues to have some increased symptoms in the third PIP as well as over FCU.   Goal status: Progressing   ASSESSMENT:  CLINICAL IMPRESSION: Patient seen for occupational therapy for right  arthroscopic carpal tunnel release and third digit trigger finger release by Dr. Edie -patient had surgery 05/28/2023.  Patient was hospitalized about 4 to 6 weeks ago.  Was not seen since then.  Patient her orthopedic follow-up in return to work 6 hours a day 4 days on 4 days off.  Patient continues to be limited in pain at the third PIP with composite flexion as well as volar wrist with supination as well as pushing boxes.  Increased pain over FCU with resistance to ulnar deviation.  Patient also with tenderness over the FCU and pisiform 8.   Patient return for first follow-up after restart done paraffin to right wrist and hand to decrease pain in third digit as well as volar wrist.  Focus this date on soft tissue mobilization using dry cupping static on volar and dorsal forearm in combination with wrist flexion extension stretches.  Patient showed increased  wrist flexion extension with less tightness and of session.  Recommend last session for patient physical therapy for strengthening and conditioning upper body thoracic and lower back and hips.  Patient report has back pain hip pain and has been favoring her hands for a while.  Patient to continue to use wrist wrap at work as well as on PIP.  Patient limited in functional use of right dominant hand in ADLs and IADLs.  Patient can benefit from skilled OT services to decrease pain, scar tissue and increased motion and strength to return to prior level of function  PERFORMANCE DEFICITS: in functional skills including ADLs, IADLs, ROM, strength, pain, flexibility, decreased knowledge of use of DME, and UE functional use,   and psychosocial skills including environmental adaptation and routines and behaviors.   IMPAIRMENTS: are limiting patient from ADLs, IADLs, rest and sleep, play, leisure, and social participation.   COMORBIDITIES: has no other co-morbidities that affects occupational performance. Patient will benefit from skilled OT to address above  impairments and improve overall function.  MODIFICATION OR ASSISTANCE TO COMPLETE EVALUATION: No modification of tasks or assist necessary to complete an evaluation.  OT OCCUPATIONAL PROFILE AND HISTORY: Problem focused assessment: Including review of records relating to presenting problem.  CLINICAL DECISION MAKING: LOW - limited treatment options, no task modification necessary  REHAB POTENTIAL: Good for goals  EVALUATION COMPLEXITY: Low    PLAN:  OT FREQUENCY: 1  x wk for 4 wks  OT DURATION: 4 wks   PLANNED INTERVENTIONS: 97168 OT Re-evaluation, 97535 self care/ADL training, 02889 therapeutic exercise, 97530 therapeutic activity, 97140 manual therapy, 97018 paraffin, 02960 fluidotherapy, 97034 contrast bath, scar mobilization, passive range of motion, patient/family education, and DME and/or AE instructions  CONSULTED AND AGREED WITH PLAN OF CARE: Patient   Ancel Peters, OTR/L,CLT 11/03/2023, 2:33 PM

## 2023-11-10 ENCOUNTER — Ambulatory Visit: Admitting: Occupational Therapy

## 2023-11-10 DIAGNOSIS — M79641 Pain in right hand: Secondary | ICD-10-CM

## 2023-11-10 DIAGNOSIS — M25641 Stiffness of right hand, not elsewhere classified: Secondary | ICD-10-CM | POA: Diagnosis not present

## 2023-11-10 DIAGNOSIS — L905 Scar conditions and fibrosis of skin: Secondary | ICD-10-CM

## 2023-11-10 NOTE — Therapy (Signed)
 OUTPATIENT OCCUPATIONAL THERAPY ORTHO TREATMENT  Patient Name: Taylor Valencia MRN: [id number] DOB:1970/08/28, 53 y.o., female Today's Date: 11/10/2023  PCP: Dr Sadie MART PROVIDER: Kip PA  END OF SESSION:  OT End of Session - 11/10/23 1516     Visit Number 25    Number of Visits 26    Date for Recertification  11/25/23    OT Start Time 1515    OT Stop Time 1600    OT Time Calculation (min) 45 min    Activity Tolerance Patient tolerated treatment well    Behavior During Therapy WFL for tasks assessed/performed             Past Medical History:  Diagnosis Date   Arthritis    Chronic back pain    History of bronchitis    Hypertension    Pre-diabetes    Past Surgical History:  Procedure Laterality Date   BOWEL RESECTION     patient had some muscle removed    CARPAL TUNNEL RELEASE Right 05/28/2023   Procedure: RELEASE, CARPAL TUNNEL, ENDOSCOPIC;  Surgeon: Edie Norleen PARAS, MD;  Location: [facility];  Service: Orthopedics;  Laterality: Right;   CHOLECYSTECTOMY     COLONOSCOPY WITH PROPOFOL  N/A 03/11/2022   Procedure: COLONOSCOPY WITH PROPOFOL ;  Surgeon: Maryruth Ole DASEN, MD;  Location: [facility];  Service: Endoscopy;  Laterality: N/A;   EYE SURGERY     lasix surgery   LUMBAR LAMINECTOMY/DECOMPRESSION MICRODISCECTOMY N/A 08/24/2015   Procedure: Thoracic nine- ten, Thoracic ten- eleven Laminectomy;  Surgeon: Reyes Budge, MD;  Location: [facility];  Service: Neurosurgery;  Laterality: N/A;  T9-10, T10-11 Laminectomy   TONSILLECTOMY     TRIGGER FINGER RELEASE Right 05/28/2023   Procedure: RELEASE, A1 PULLEY, FOR TRIGGER FINGER;  Surgeon: Edie Norleen PARAS, MD;  Location: [facility];  Service: Orthopedics;  Laterality: Right;   Patient Active Problem List   Diagnosis Date Noted   Acute pancreatitis 09/11/2023   Essential hypertension 12/22/2018   Morbid obesity (HCC) 12/22/2018   Depression 12/22/2018   Thoracic myelopathy 08/24/2015    ONSET  DATE: 05/28/23  REFERRING DIAG: R CTS and 3rd trigger finger release  THERAPY DIAG:  Pain in right hand  Stiffness of right hand, not elsewhere classified  Scar condition and fibrosis of skin  Rationale for Evaluation and Treatment: Rehabilitation  SUBJECTIVE:   SUBJECTIVE STATEMENT: Pt reports she woke up at 3am in pain unable to bend fingers well, resolved in morning.  Pt accompanied by: self  PERTINENT HISTORY: Impression: Carpal tunnel syndrome, right [G56.01] Carpal tunnel syndrome, right (primary encounter diagnosis) Trigger middle finger of right hand  Plan:  1. Treatment options were discussed today with the patient. 2. Sutures removed at today's visit, benzoin and Steri-Strips were applied. The patient was instructed that she may begin the shower at this time. 3. She was given hand exercises to perform at home at this time. She was instructed to begin massaging the incision sites later this week to decrease chances of scar tissue development. 4. The patient was instructed to continue to wear the Velcro wrist splint for 1 more week at night but she does not need to wear it during today. Gradually increase activities as tolerated to the right hand. 5. Given her limitations with flexion at today's visit, a referral for formal occupational therapy was placed to the patient. A work note was provided keeping the patient out until her next follow-up appointment. 6. The patient will follow-up in 1 month with Dr.  Poggi for repeat evaluation of the right hand. They can call the clinic they have any questions, new symptoms develop or symptoms worsen.   PRECAUTIONS: None  WEIGHT BEARING RESTRICTIONS: No  PAIN:  Are you having pain? Ulnar wrist pain cutting pineapple 5/10   FALLS: Has patient fallen in last 6 months? 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  In her free time doing things  around the house  PATIENT GOALS: Get the scar better as well as her motion and strength to be able to do her job and things around the house  NEXT MD VISIT: ?  OBJECTIVE:  Note: Objective measures were completed at Evaluation unless otherwise noted.  HAND DOMINANCE: Right  ADLs: Patient limited in use of right dominant hand because of incision not closed and guarding as well as increased edema and decreased motion and increased pain   FUNCTIONAL OUTCOME MEASURES: Next session   UPPER EXTREMITY ROM:     Active ROM Right eval Left eval R 06/30/23 R 07/24/23 R 08/15/23 R 10/28/23  Shoulder flexion        Shoulder abduction        Shoulder adduction        Shoulder extension        Shoulder internal rotation        Shoulder external rotation        Elbow flexion        Elbow extension        Wrist flexion 40  42 65 70 80 80  Wrist extension 70 digits bend 66, digits slightly flexed 65 70 70 65  Wrist ulnar deviation        Wrist radial deviation      Pain at FCU 6/10  Wrist pronation        Wrist supination      Pain with resistance volar wrist  (Blank rows = not tested)  Active ROM Right eval Left eval R 06/25/23 R  06/30/23 R 07/08/23 R 07/10/23 R 07/24/23 R 08/14/23 R 10/28/23  Thumb MCP (0-60)           Thumb IP (0-80)           Thumb Radial abd/add (0-55) 42    55       Thumb Palmar abd/add (0-45) 50    50       Thumb Opposition to Small Finger            Index MCP (0-90) 50   60 65 60 65 60 70 60  Index PIP (0-100) 70   85 95 95  90 90 95  Index DIP (0-70)             Long MCP (0-90) 45    55 60 60 70 60 75 80  Long PIP (0-100)  90   80 80 95  95 95 100  Long DIP (0-70)             Ring MCP (0-90) 60    60 60 60 70 65 80 80  Ring PIP (0-100) 80    80 95 100  100 100 100  Ring DIP (0-70)             Little MCP (0-90) 60    55 65 60 70 70 80 90  Little PIP (0-100) 80    80 85 95  100 95  100  Little DIP (0-70)             (Blank rows = not tested)   HAND  FUNCTION:  Grip strength: Right: 10 lbs; Left: 35 lbs, Lateral pinch: Right: 8 lbs, Left: 12 lbs, and 3 point pinch: Right: 4 lbs, Left: 12 lbs 07/14/23 Grip strength: Right: 12lbs; Left: 35 lbs, Lateral pinch: Right: 8 lbs, Left: 12 lbs, and 3 point pinch: Right: 5 lbs, Left: 12 lbs 07/24/23 Grip strength: Right: 16lbs; Left: 35 lbs, Lateral pinch: Right: 8 lbs, Left: 12 lbs, and 3 point pinch: Right: 5 lbs, Left: 12 lbs 08/12/2023 Grip right 18#, lateral pinch 11#, 3 point pinch 7# 08/15/23 Grip strength: Right: 20lbs; Left: 35 lbs, Lateral pinch: Right: 12 lbs, Left: 12 lbs, and 3 point pinch: Right: 7 lbs, Left: 12 lbs 08/18/23 Grip strength: Right: 22lbs; Left: 35 lbs, Lateral pinch: Right: 12 lbs, Left: 12 lbs, and 3 point pinch: Right: 9 lbs, Left: 12 lbs 08/25/23 Grip strength: Right: 26 lbs; Left: 35 lbs, Lateral pinch: Right: 14 lbs, Left: 12 lbs, and 3 point pinch: Right: 10 lbs, Left: 12 lbs 08/28/23 Grip strength: Right: 27 lbs; Left: 35 lbs, Lateral pinch: Right: 14 lbs, Left: 12 lbs, and 3 point pinch: Right: 10 lbs, Left: 12 lbs 09/02/23 Grip strength: Right: 27 lbs; Left: 45 lbs, Lateral pinch: Right: 14 lbs, Left: 12 lbs, and 3 point pinch: Right: 10 lbs, Left: 12 lbs 10/28/23 Grip strength: Right: 35 lbs; Left: 40 lbs, Lateral pinch: Right: 14 lbs, Left: 13 lbs, and 3 point pinch: Right: 12 lbs, Left: 12 lbs  COORDINATION: Impaired because of increased swelling and pain in stiffness  SENSATION: Numbness in 2nd, 3rd and 4th digits with third throwers  EDEMA: edema on the right hand  COGNITION: Overall cognitive status: Within functional limits for tasks assessed  TREATMENT DATE: 11/10/23    Paraffin bath done 8 minutes to right hand to decrease pain and stiffness in joints  After paraffin patient had no pain in PIP with composite flexion as well as less pain in wrist.  Supination was pain-free but patient continued to have some discomfort over the FCU with resistance to ulnar  deviation and tenderness with palpation  Light static cupping with 1 cups over volar forearm and  1 over volar palm in combination with composite wrist and digit extension 3 set x 10 reps Dry light static cupping 2 cups over dorsal forearm in combination with wrist flexion 3 set x 10 reps each with elbow bent and elbow extended.  Decreased tightness noted after cupping - pt reports wanting to buy cups to complete cupping at home, recommended type of cup to look for.  A1 pulley stretch 5-10 sec hold with education to perform in HEP. Prayer stretch 2 set x 10 reps with cues to hold ~5 sec for wrist extension stretch  Pt continues to report wearing silicone sleeve for PIP at work. Pt reports plan to finish OT before starting PT for her back.      PATIENT EDUCATION: Education details: findings of eval and HEP  Person educated: Patient Education method: Explanation, Demonstration, Tactile cues, Verbal cues, and Handouts Education comprehension: verbalized understanding, returned demonstration, verbal cues required, and needs further education    GOALS: Goals reviewed with patient? Yes   LONG TERM GOALS: Target date: 8 wks  Patient be independent in home program to decrease edema and pain for patient to increase digit flexion to touching palm. Baseline: Incision not  closed.  Increased edema and pain increased to 8-10/10 in the third digit; MC flexion 45 to 60 degrees and PIP 70-90 Goal status: Met  2.  Wrist flexion extension on the right improved to within normal limits for patient to push and pull heavy door as well as turn doorknob without increased symptoms Baseline: Patient continues to be limited in wrist flexion with pain over FCU with pulling as well as discomfort with pushing boxes at work  goal status: Progressing  3.  Incisional right hand improved for patient to initiate scar massage as well as being able to tolerate different textures including tapping and clapping hands  with no increase symptoms Baseline: Patient report incision not closed since last time.  Steri-Strips came off and incision opening with digit extension.  Steri-Strips applied if not staying in place patient to contact orthopedics in the next 24 to 48 hours.  Keep home exercises without opening or putting stress on incision Goal status: Met  4.  Right grip and prehension strength improve to more than 75% compared to the left for patient to return to using hand normally at home and cooking and laundry and cleaning activities without increase symptoms Baseline: Grip and prehension strength improved to within normal range with the left but continues to have some increased symptoms in the third PIP as well as over FCU.   Goal status: Progressing   ASSESSMENT:  CLINICAL IMPRESSION: Patient seen for occupational therapy for right arthroscopic carpal tunnel release and third digit trigger finger release by Dr. Edie -patient had surgery 05/28/2023.  Patient was hospitalized about 4 to 6 weeks ago.  Was not seen since then.  Patient her orthopedic follow-up in return to work 6 hours a day 4 days on 4 days off.  Patient continues to be limited in pain at the third PIP with composite flexion as well as volar wrist with supination as well as pushing boxes.  Increased pain over FCU with resistance to ulnar deviation.  Patient also with tenderness over the FCU and pisiform 8.   NOW: Focus this date on soft tissue mobilization using dry cupping static on volar and dorsal forearm in combination with wrist flexion extension stretches with decreased tightness noted following. Added A1 pulley and prayer stretch to HEP with good return demonstration. Pt reports plan to start PT after OT has finished. Patient limited in functional use of right dominant hand in ADLs and IADLs.  Patient can benefit from skilled OT services to decrease pain, scar tissue and increased motion and strength to return to prior level of  function  PERFORMANCE DEFICITS: in functional skills including ADLs, IADLs, ROM, strength, pain, flexibility, decreased knowledge of use of DME, and UE functional use,   and psychosocial skills including environmental adaptation and routines and behaviors.   IMPAIRMENTS: are limiting patient from ADLs, IADLs, rest and sleep, play, leisure, and social participation.   COMORBIDITIES: has no other co-morbidities that affects occupational performance. Patient will benefit from skilled OT to address above impairments and improve overall function.  MODIFICATION OR ASSISTANCE TO COMPLETE EVALUATION: No modification of tasks or assist necessary to complete an evaluation.  OT OCCUPATIONAL PROFILE AND HISTORY: Problem focused assessment: Including review of records relating to presenting problem.  CLINICAL DECISION MAKING: LOW - limited treatment options, no task modification necessary  REHAB POTENTIAL: Good for goals  EVALUATION COMPLEXITY: Low    PLAN:  OT FREQUENCY: 1  x wk for 4 wks  OT DURATION: 4 wks   PLANNED  INTERVENTIONS: 97168 OT Re-evaluation, 97535 self care/ADL training, 02889 therapeutic exercise, 97530 therapeutic activity, 97140 manual therapy, 97018 paraffin, 02960 fluidotherapy, 97034 contrast bath, scar mobilization, passive range of motion, patient/family education, and DME and/or AE instructions  CONSULTED AND AGREED WITH PLAN OF CARE: Patient   Elston JINNY Slot, OTR/L 11/10/2023, 3:17 PM

## 2023-11-17 ENCOUNTER — Ambulatory Visit: Admitting: Occupational Therapy

## 2023-11-17 DIAGNOSIS — M6281 Muscle weakness (generalized): Secondary | ICD-10-CM

## 2023-11-17 DIAGNOSIS — M79641 Pain in right hand: Secondary | ICD-10-CM

## 2023-11-17 DIAGNOSIS — M25641 Stiffness of right hand, not elsewhere classified: Secondary | ICD-10-CM | POA: Diagnosis not present

## 2023-11-17 NOTE — Therapy (Signed)
 OUTPATIENT OCCUPATIONAL THERAPY ORTHO TREATMENT  Patient Name: Taylor Valencia MRN: [id number] DOB:1970/09/10, 53 y.o., female Today's Date: 11/17/2023  PCP: Dr Sadie MART PROVIDER: Kip PA  END OF SESSION:  OT End of Session - 11/17/23 1212     Visit Number 26    Number of Visits 30    Date for Recertification  11/25/23    OT Start Time 1210    OT Stop Time 1248    OT Time Calculation (min) 38 min    Activity Tolerance Patient tolerated treatment well    Behavior During Therapy WFL for tasks assessed/performed             Past Medical History:  Diagnosis Date   Arthritis    Chronic back pain    History of bronchitis    Hypertension    Pre-diabetes    Past Surgical History:  Procedure Laterality Date   BOWEL RESECTION     patient had some muscle removed    CARPAL TUNNEL RELEASE Right 05/28/2023   Procedure: RELEASE, CARPAL TUNNEL, ENDOSCOPIC;  Surgeon: Edie Norleen PARAS, MD;  Location: [facility];  Service: Orthopedics;  Laterality: Right;   CHOLECYSTECTOMY     COLONOSCOPY WITH PROPOFOL  N/A 03/11/2022   Procedure: COLONOSCOPY WITH PROPOFOL ;  Surgeon: Maryruth Ole DASEN, MD;  Location: [facility];  Service: Endoscopy;  Laterality: N/A;   EYE SURGERY     lasix surgery   LUMBAR LAMINECTOMY/DECOMPRESSION MICRODISCECTOMY N/A 08/24/2015   Procedure: Thoracic nine- ten, Thoracic ten- eleven Laminectomy;  Surgeon: Reyes Budge, MD;  Location: [facility];  Service: Neurosurgery;  Laterality: N/A;  T9-10, T10-11 Laminectomy   TONSILLECTOMY     TRIGGER FINGER RELEASE Right 05/28/2023   Procedure: RELEASE, A1 PULLEY, FOR TRIGGER FINGER;  Surgeon: Edie Norleen PARAS, MD;  Location: [facility];  Service: Orthopedics;  Laterality: Right;   Patient Active Problem List   Diagnosis Date Noted   Acute pancreatitis 09/11/2023   Essential hypertension 12/22/2018   Morbid obesity (HCC) 12/22/2018   Depression 12/22/2018   Thoracic myelopathy 08/24/2015    ONSET  DATE: 05/28/23  REFERRING DIAG: R CTS and 3rd trigger finger release  THERAPY DIAG:  Pain in right hand  Stiffness of right hand, not elsewhere classified  Muscle weakness (generalized)  Rationale for Evaluation and Treatment: Rehabilitation  SUBJECTIVE:   SUBJECTIVE STATEMENT: Pt reports she was unable to do exercises the last week because of a death in the family.  Pt accompanied by: self  PERTINENT HISTORY: Impression: Carpal tunnel syndrome, right [G56.01] Carpal tunnel syndrome, right (primary encounter diagnosis) Trigger middle finger of right hand  Plan:  1. Treatment options were discussed today with the patient. 2. Sutures removed at today's visit, benzoin and Steri-Strips were applied. The patient was instructed that she may begin the shower at this time. 3. She was given hand exercises to perform at home at this time. She was instructed to begin massaging the incision sites later this week to decrease chances of scar tissue development. 4. The patient was instructed to continue to wear the Velcro wrist splint for 1 more week at night but she does not need to wear it during today. Gradually increase activities as tolerated to the right hand. 5. Given her limitations with flexion at today's visit, a referral for formal occupational therapy was placed to the patient. A work note was provided keeping the patient out until her next follow-up appointment. 6. The patient will follow-up in 1 month with Dr. Edie for  repeat evaluation of the right hand. They can call the clinic they have any questions, new symptoms develop or symptoms worsen.   PRECAUTIONS: None  WEIGHT BEARING RESTRICTIONS: No  PAIN:  Are you having pain? Ulnar wrist pain 1/10   FALLS: Has patient fallen in last 6 months? 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  In her free time doing things around the  house  PATIENT GOALS: Get the scar better as well as her motion and strength to be able to do her job and things around the house  NEXT MD VISIT: ?  OBJECTIVE:  Note: Objective measures were completed at Evaluation unless otherwise noted.  HAND DOMINANCE: Right  ADLs: Patient limited in use of right dominant hand because of incision not closed and guarding as well as increased edema and decreased motion and increased pain   FUNCTIONAL OUTCOME MEASURES: Next session   UPPER EXTREMITY ROM:     Active ROM Right eval Left eval R 06/30/23 R 07/24/23 R 08/15/23 R 10/28/23 R 11/17/23  Shoulder flexion         Shoulder abduction         Shoulder adduction         Shoulder extension         Shoulder internal rotation         Shoulder external rotation         Elbow flexion         Elbow extension         Wrist flexion 40  42 65 70 80 80 85  Wrist extension 70 digits bend 66, digits slightly flexed 65 70 70 65 65  Wrist ulnar deviation         Wrist radial deviation      Pain at FCU 6/10   Wrist pronation         Wrist supination      Pain with resistance volar wrist   (Blank rows = not tested)  Active ROM Right eval Left eval R 06/25/23 R  06/30/23 R 07/08/23 R 07/10/23 R 07/24/23 R 08/14/23 R 10/28/23  Thumb MCP (0-60)           Thumb IP (0-80)           Thumb Radial abd/add (0-55) 42    55       Thumb Palmar abd/add (0-45) 50    50       Thumb Opposition to Small Finger            Index MCP (0-90) 50   60 65 60 65 60 70 60  Index PIP (0-100) 70   85 95 95  90 90 95  Index DIP (0-70)             Long MCP (0-90) 45    55 60 60 70 60 75 80  Long PIP (0-100)  90   80 80 95  95 95 100  Long DIP (0-70)             Ring MCP (0-90) 60    60 60 60 70 65 80 80  Ring PIP (0-100) 80    80 95 100  100 100 100  Ring DIP (0-70)             Little MCP (0-90) 60    55 65 60 70 70 80 90  Little  PIP (0-100) 80    80 85 95  100 95 100  Little DIP (0-70)             (Blank rows = not  tested)   HAND FUNCTION:  Grip strength: Right: 10 lbs; Left: 35 lbs, Lateral pinch: Right: 8 lbs, Left: 12 lbs, and 3 point pinch: Right: 4 lbs, Left: 12 lbs 07/14/23 Grip strength: Right: 12lbs; Left: 35 lbs, Lateral pinch: Right: 8 lbs, Left: 12 lbs, and 3 point pinch: Right: 5 lbs, Left: 12 lbs 07/24/23 Grip strength: Right: 16lbs; Left: 35 lbs, Lateral pinch: Right: 8 lbs, Left: 12 lbs, and 3 point pinch: Right: 5 lbs, Left: 12 lbs 08/12/2023 Grip right 18#, lateral pinch 11#, 3 point pinch 7# 08/15/23 Grip strength: Right: 20lbs; Left: 35 lbs, Lateral pinch: Right: 12 lbs, Left: 12 lbs, and 3 point pinch: Right: 7 lbs, Left: 12 lbs 08/18/23 Grip strength: Right: 22lbs; Left: 35 lbs, Lateral pinch: Right: 12 lbs, Left: 12 lbs, and 3 point pinch: Right: 9 lbs, Left: 12 lbs 08/25/23 Grip strength: Right: 26 lbs; Left: 35 lbs, Lateral pinch: Right: 14 lbs, Left: 12 lbs, and 3 point pinch: Right: 10 lbs, Left: 12 lbs 08/28/23 Grip strength: Right: 27 lbs; Left: 35 lbs, Lateral pinch: Right: 14 lbs, Left: 12 lbs, and 3 point pinch: Right: 10 lbs, Left: 12 lbs 09/02/23 Grip strength: Right: 27 lbs; Left: 45 lbs, Lateral pinch: Right: 14 lbs, Left: 12 lbs, and 3 point pinch: Right: 10 lbs, Left: 12 lbs 10/28/23 Grip strength: Right: 35 lbs; Left: 40 lbs, Lateral pinch: Right: 14 lbs, Left: 13 lbs, and 3 point pinch: Right: 12 lbs, Left: 12 lbs 11/17/23 Grip strength: Right: 35 lbs; Left: 46 lbs, Lateral pinch: Right: 12 lbs, Left: 12 lbs, and 3 point pinch: Right: 10 lbs, Left: 15 lbs  COORDINATION: Impaired because of increased swelling and pain in stiffness  SENSATION: Numbness in 2nd, 3rd and 4th digits with third throwers  EDEMA: edema on the right hand  COGNITION: Overall cognitive status: Within functional limits for tasks assessed  TREATMENT DATE: 11/17/23    Paraffin bath done 8 minutes to right hand to decrease pain and stiffness in joints  After paraffin patient had no pain in PIP with  composite flexion as well as less pain in wrist.   Light static cupping with 1 cups over volar forearm and  1 over volar palm in combination with composite wrist and digit extension 3 set x 10 reps Dry light static cupping 2 cups over dorsal forearm in combination with wrist flexion 3 set x 10 reps each with elbow bent and elbow extended.  Decreased tightness noted after cupping   A1 pulley stretch 5-10 sec hold with education to perform in HEP. Prayer stretch 2 set x 10 reps with cues to hold ~5 sec for wrist extension stretch  Pt continues to report wearing silicone sleeve for PIP at work, ordered wrong size and plan to reorder.  Pt reports plan to finish OT before starting PT for her back.      PATIENT EDUCATION: Education details: findings of eval and HEP  Person educated: Patient Education method: Explanation, Demonstration, Tactile cues, Verbal cues, and Handouts Education comprehension: verbalized understanding, returned demonstration, verbal cues required, and needs further education    GOALS: Goals reviewed with patient? Yes   LONG TERM GOALS: Target date: 8 wks  Patient be independent in home program to decrease edema and pain for patient to increase digit  flexion to touching palm. Baseline: Incision not closed.  Increased edema and pain increased to 8-10/10 in the third digit; MC flexion 45 to 60 degrees and PIP 70-90 Goal status: Met  2.  Wrist flexion extension on the right improved to within normal limits for patient to push and pull heavy door as well as turn doorknob without increased symptoms Baseline: Patient continues to be limited in wrist flexion with pain over FCU with pulling as well as discomfort with pushing boxes at work  goal status: Progressing  3.  Incisional right hand improved for patient to initiate scar massage as well as being able to tolerate different textures including tapping and clapping hands with no increase symptoms Baseline: Patient  report incision not closed since last time.  Steri-Strips came off and incision opening with digit extension.  Steri-Strips applied if not staying in place patient to contact orthopedics in the next 24 to 48 hours.  Keep home exercises without opening or putting stress on incision Goal status: Met  4.  Right grip and prehension strength improve to more than 75% compared to the left for patient to return to using hand normally at home and cooking and laundry and cleaning activities without increase symptoms Baseline: Grip and prehension strength improved to within normal range with the left but continues to have some increased symptoms in the third PIP as well as over FCU.   Goal status: Progressing   ASSESSMENT:  CLINICAL IMPRESSION: Patient seen for occupational therapy for right arthroscopic carpal tunnel release and third digit trigger finger release by Dr. Edie -patient had surgery 05/28/2023.  Patient was hospitalized about 4 to 6 weeks ago.  Was not seen since then.  Patient her orthopedic follow-up in return to work 6 hours a day 4 days on 4 days off.  Patient continues to be limited in pain at the third PIP with composite flexion as well as volar wrist with supination as well as pushing boxes.  Increased pain over FCU with resistance to ulnar deviation.  Patient also with tenderness over the FCU and pisiform 8.   NOW: Pt reports unable to complete exercises in the last week due to a death in the family, therapeutic listening provided. Focus this date on soft tissue mobilization using dry cupping static on volar and dorsal forearm in combination with wrist flexion extension stretches with decreased tightness noted following. R wrist flexion improved to 85*. Reviewed A1 pulley to HEP with good return demonstration.  Continues to demonstrate limited grip and prehension strength. Patient limited in functional use of right dominant hand in ADLs and IADLs.  Patient can benefit from skilled OT services  to decrease pain, scar tissue and increased motion and strength to return to prior level of function  PERFORMANCE DEFICITS: in functional skills including ADLs, IADLs, ROM, strength, pain, flexibility, decreased knowledge of use of DME, and UE functional use,   and psychosocial skills including environmental adaptation and routines and behaviors.   IMPAIRMENTS: are limiting patient from ADLs, IADLs, rest and sleep, play, leisure, and social participation.   COMORBIDITIES: has no other co-morbidities that affects occupational performance. Patient will benefit from skilled OT to address above impairments and improve overall function.  MODIFICATION OR ASSISTANCE TO COMPLETE EVALUATION: No modification of tasks or assist necessary to complete an evaluation.  OT OCCUPATIONAL PROFILE AND HISTORY: Problem focused assessment: Including review of records relating to presenting problem.  CLINICAL DECISION MAKING: LOW - limited treatment options, no task modification necessary  REHAB POTENTIAL:  Good for goals  EVALUATION COMPLEXITY: Low    PLAN:  OT FREQUENCY: 1  x wk for 4 wks  OT DURATION: 4 wks   PLANNED INTERVENTIONS: 97168 OT Re-evaluation, 97535 self care/ADL training, 02889 therapeutic exercise, 97530 therapeutic activity, 97140 manual therapy, 97018 paraffin, 02960 fluidotherapy, 97034 contrast bath, scar mobilization, passive range of motion, patient/family education, and DME and/or AE instructions  CONSULTED AND AGREED WITH PLAN OF CARE: Patient   Elston JINNY Slot, OTR/L 11/17/2023, 1:16 PM
